# Patient Record
Sex: Female | Born: 1960 | Race: Black or African American | Hispanic: No | Smoking: Never smoker
Health system: Southern US, Community
[De-identification: ages and names within clinical notes are randomized; demographics above are authoritative.]

## PROBLEM LIST (undated history)

## (undated) DIAGNOSIS — K219 Gastro-esophageal reflux disease without esophagitis: Secondary | ICD-10-CM

## (undated) DIAGNOSIS — I10 Essential (primary) hypertension: Secondary | ICD-10-CM

## (undated) DIAGNOSIS — G709 Myoneural disorder, unspecified: Secondary | ICD-10-CM

## (undated) DIAGNOSIS — K279 Peptic ulcer, site unspecified, unspecified as acute or chronic, without hemorrhage or perforation: Secondary | ICD-10-CM

## (undated) DIAGNOSIS — F419 Anxiety disorder, unspecified: Secondary | ICD-10-CM

## (undated) DIAGNOSIS — E119 Type 2 diabetes mellitus without complications: Secondary | ICD-10-CM

## (undated) DIAGNOSIS — M1990 Unspecified osteoarthritis, unspecified site: Secondary | ICD-10-CM

## (undated) DIAGNOSIS — F32A Depression, unspecified: Secondary | ICD-10-CM

## (undated) DIAGNOSIS — J45909 Unspecified asthma, uncomplicated: Secondary | ICD-10-CM

## (undated) HISTORY — PX: BREAST CYST EXCISION: SHX579

## (undated) HISTORY — PX: ABDOMINAL HYSTERECTOMY: SHX81

---

## 2003-11-07 ENCOUNTER — Other Ambulatory Visit: Payer: Self-pay

## 2004-02-05 ENCOUNTER — Other Ambulatory Visit: Payer: Self-pay

## 2005-01-29 ENCOUNTER — Emergency Department: Payer: Self-pay | Admitting: Emergency Medicine

## 2005-02-19 ENCOUNTER — Emergency Department: Payer: Self-pay | Admitting: Emergency Medicine

## 2005-05-17 ENCOUNTER — Emergency Department: Payer: Self-pay | Admitting: Emergency Medicine

## 2005-05-17 ENCOUNTER — Other Ambulatory Visit: Payer: Self-pay

## 2007-03-09 ENCOUNTER — Emergency Department: Payer: Self-pay | Admitting: Emergency Medicine

## 2007-03-09 ENCOUNTER — Other Ambulatory Visit: Payer: Self-pay

## 2008-02-29 ENCOUNTER — Emergency Department: Payer: Self-pay | Admitting: Emergency Medicine

## 2008-04-22 ENCOUNTER — Emergency Department: Payer: Self-pay | Admitting: Emergency Medicine

## 2008-06-12 ENCOUNTER — Ambulatory Visit: Payer: Self-pay | Admitting: Internal Medicine

## 2008-09-18 ENCOUNTER — Emergency Department: Payer: Self-pay | Admitting: Emergency Medicine

## 2008-11-25 ENCOUNTER — Emergency Department: Payer: Self-pay | Admitting: Emergency Medicine

## 2009-05-26 ENCOUNTER — Emergency Department: Payer: Self-pay | Admitting: Emergency Medicine

## 2009-07-29 ENCOUNTER — Emergency Department: Payer: Self-pay

## 2009-08-11 ENCOUNTER — Emergency Department: Payer: Self-pay | Admitting: Internal Medicine

## 2010-01-20 ENCOUNTER — Emergency Department: Payer: Self-pay | Admitting: Emergency Medicine

## 2010-05-05 ENCOUNTER — Ambulatory Visit: Payer: Self-pay | Admitting: Internal Medicine

## 2011-09-04 ENCOUNTER — Emergency Department: Payer: Self-pay | Admitting: Unknown Physician Specialty

## 2012-02-22 ENCOUNTER — Emergency Department (HOSPITAL_COMMUNITY): Payer: Medicare Other

## 2012-02-22 ENCOUNTER — Encounter (HOSPITAL_COMMUNITY): Payer: Self-pay

## 2012-02-22 ENCOUNTER — Emergency Department (HOSPITAL_COMMUNITY)
Admission: EM | Admit: 2012-02-22 | Discharge: 2012-02-23 | Disposition: A | Discharge status: Home / Self Care | Payer: Medicare Other | Attending: Emergency Medicine | Admitting: Emergency Medicine

## 2012-02-22 DIAGNOSIS — R55 Syncope and collapse: Secondary | ICD-10-CM

## 2012-02-22 DIAGNOSIS — Z79899 Other long term (current) drug therapy: Secondary | ICD-10-CM | POA: Insufficient documentation

## 2012-02-22 DIAGNOSIS — F411 Generalized anxiety disorder: Secondary | ICD-10-CM | POA: Insufficient documentation

## 2012-02-22 DIAGNOSIS — I10 Essential (primary) hypertension: Secondary | ICD-10-CM | POA: Insufficient documentation

## 2012-02-22 DIAGNOSIS — R079 Chest pain, unspecified: Secondary | ICD-10-CM | POA: Insufficient documentation

## 2012-02-22 HISTORY — DX: Anxiety disorder, unspecified: F41.9

## 2012-02-22 HISTORY — DX: Essential (primary) hypertension: I10

## 2012-02-22 HISTORY — DX: Peptic ulcer, site unspecified, unspecified as acute or chronic, without hemorrhage or perforation: K27.9

## 2012-02-22 LAB — DIFFERENTIAL
Eosinophils Relative: 2 % (ref 0–5)
Lymphocytes Relative: 24 % (ref 12–46)
Lymphs Abs: 1.8 10*3/uL (ref 0.7–4.0)

## 2012-02-22 LAB — CBC
Hemoglobin: 11.9 g/dL — ABNORMAL LOW (ref 12.0–15.0)
MCV: 81 fL (ref 78.0–100.0)
Platelets: 290 10*3/uL (ref 150–400)
RBC: 4.41 MIL/uL (ref 3.87–5.11)
WBC: 7.5 10*3/uL (ref 4.0–10.5)

## 2012-02-22 LAB — BASIC METABOLIC PANEL
CO2: 26 mEq/L (ref 19–32)
Calcium: 10.2 mg/dL (ref 8.4–10.5)
Glucose, Bld: 137 mg/dL — ABNORMAL HIGH (ref 70–99)
Potassium: 3.2 mEq/L — ABNORMAL LOW (ref 3.5–5.1)
Sodium: 138 mEq/L (ref 135–145)

## 2012-02-22 LAB — PROTIME-INR
INR: 1.01 (ref 0.00–1.49)
Prothrombin Time: 13.5 seconds (ref 11.6–15.2)

## 2012-02-22 LAB — CARDIAC PANEL(CRET KIN+CKTOT+MB+TROPI)
CK, MB: 2.3 ng/mL (ref 0.3–4.0)
Troponin I: <0.3 ng/mL (ref <0.30)

## 2012-02-22 MED ORDER — ASPIRIN 81 MG PO CHEW: 324.0000 mg | CHEWABLE_TABLET | Freq: Once | ORAL | Status: DC

## 2012-02-22 MED ORDER — POTASSIUM CHLORIDE CRYS ER 20 MEQ PO TBCR
40.0000 meq | EXTENDED_RELEASE_TABLET | Freq: Once | ORAL | Status: AC
  Administered 2012-02-23: 40 meq via ORAL
  Filled 2012-02-22: qty 2

## 2012-02-22 NOTE — ED Provider Notes (Addendum)
History  This chart was scribed for Felisa Bonier, MD by Bennett Scrape. This patient was seen in room APA18/AP[facility]18 and the patient's care was started at 10:29PM.  CSN: [id number]  Arrival date & time 02/22/12  2151   First MD Initiated Contact with Patient 02/22/12 2219      Chief Complaint  Patient presents with  . Chest Pain     The history is provided by the patient. No language interpreter was used.    Elizabeth Coffey is a 51 y.o. female brought in by ambulance, who presents to the Emergency Department complaining of a syncopal episode that occurred after she found out that her husband had passed away. Pt states that her husband passed away at hospice today around 4pm and that she was not told that he had passed until 9PM when she arrived at the hospice home to see him. She then continues on to to say that she was immediately thrust into family bickering over her husband's estate and felt like she couldn't breath, so she stepped out into the hallway and felt everything go black. She then remembers waking up on the floor with left sided chest pain that radiated into her left arm and feeling diaphoretic. She stated that her chest pain last until EMS gave her the second nitroglycerine pill. She denies nausea and vomiting as associated symptoms. She has a h/o HTN and anxiety, but she denies having a h/o MI. She denies smoking and alcohol use.  Past Medical History  Diagnosis Date  . Hypertension   . Anxiety   . Peptic ulcer     Past Surgical History  Procedure Date  . Abdominal hysterectomy     No family history on file.  History  Substance Use Topics  . Smoking status: Never Smoker   . Smokeless tobacco: Not on file  . Alcohol Use: No     Review of Systems  Constitutional: Negative for fever and chills.  HENT: Negative for congestion, sore throat and neck pain.   Eyes: Negative for pain.  Respiratory: Negative for cough and shortness of breath.   Cardiovascular: Positive  for chest pain.  Gastrointestinal: Negative for nausea, vomiting, abdominal pain and diarrhea.  Genitourinary: Negative for dysuria, frequency and hematuria.  Musculoskeletal: Negative for back pain.  Skin: Negative for rash.  Neurological: Positive for syncope. Negative for headaches.    Allergies  Chocolate  Home Medications   Current Outpatient Rx  Name Route Sig Dispense Refill  . AMLODIPINE BESY-BENAZEPRIL HCL 10-20 MG PO CAPS Oral Take 1 capsule by mouth daily.    . ASPIRIN-ACETAMINOPHEN-CAFFEINE 250-250-65 MG PO TABS Oral Take 1-2 tablets by mouth as needed. For headaches    . BUPROPION HCL 100 MG PO TABS Oral Take 100 mg by mouth 2 (two) times daily.    Marland Kitchen OMEPRAZOLE 40 MG PO CPDR Oral Take 40 mg by mouth daily.    . TRIAMTERENE-HCTZ 37.5-25 MG PO TABS Oral Take 1 tablet by mouth daily.      Triage Vitals: BP 152/92  Temp(Src) 98.7 F (37.1 C) (Oral)  Resp 16  Ht 5\' 4"  (1.626 m)  Wt 210 lb (95.255 kg)  BMI 36.05 kg/m2  SpO2 99%  Physical Exam  Nursing note and vitals reviewed. Constitutional: She is oriented to person, place, and time. She appears well-developed and well-nourished.  HENT:  Head: Normocephalic and atraumatic.  Eyes: Conjunctivae are normal. Pupils are equal, round, and reactive to light.  Neck: Normal range of motion. Neck  supple. No JVD present.  Cardiovascular: Normal rate, regular rhythm and normal heart sounds.  Exam reveals no gallop and no friction rub.   No murmur heard. Pulmonary/Chest: Effort normal and breath sounds normal. No respiratory distress. She has no wheezes. She has no rales.       Lungs are clear, no rhonchi, good air exchange  Abdominal: Soft. Bowel sounds are normal. There is no tenderness.  Musculoskeletal: Normal range of motion. She exhibits no edema (no lower extremity edema).  Neurological: She is alert and oriented to person, place, and time. No cranial nerve deficit.  Skin: Skin is warm and dry.    ED Course    Procedures (including critical care time)   Date: 02/22/2012  Rate: 89  Rhythm: normal sinus rhythm  QRS Axis: normal  Intervals: normal  ST/T Wave abnormalities: nonspecific T wave changes  Conduction Disutrbances:none  Narrative Interpretation: non-provacative EKG  Old EKG Reviewed: none available  DIAGNOSTIC STUDIES: Oxygen Saturation is 99% on room air, normal by my interpretation.    COORDINATION OF CARE: 10:35PM-Discussed treatment plan with pt and pt agreed to plan.    Labs Reviewed  CBC - Abnormal; Notable for the following:    Hemoglobin 11.9 (*)    HCT 35.7 (*)    All other components within normal limits  BASIC METABOLIC PANEL - Abnormal; Notable for the following:    Potassium 3.2 (*)    Glucose, Bld 137 (*)    Creatinine, Ser 1.15 (*)    GFR calc non Af Amer 54 (*)    GFR calc Af Amer 63 (*)    All other components within normal limits  CARDIAC PANEL(CRET KIN+CKTOT+MB+TROPI) - Abnormal; Notable for the following:    Total CK 197 (*)    All other components within normal limits  DIFFERENTIAL  PROTIME-INR  APTT  POCT I-STAT TROPONIN I   Dg Chest Port 1 View  02/22/2012  *RADIOLOGY REPORT*  Clinical Data: Chest pain  PORTABLE CHEST - 1 VIEW  Comparison: None.  Findings: Lungs clear.  Heart size and pulmonary vascularity normal.  No effusion.  Visualized bones unremarkable.  IMPRESSION: No acute disease  Original Report Authenticated By: Osa Craver, M.D.     No diagnosis found.    MDM  Vasovagal syncope, anemia, arrthymia, electrolyte abnormality, myocardial infarction, and acute coronary syndrome are all entertained in the differenital diagnosis.  At this time, the patient has had no recurrent chest pain symptoms, and her cardiac enzymes and EKG show no evidence of myocardial ischemia or arrhythmia. She has no apparent anemia or electrolyte abnormality. Based on the circumstances, the patient having just learned of her husband's death, and  severe emotional distress, I believe that this was a vasovagal syncope. The patient is otherwise not at high risk for coronary artery disease and does not have known history of coronary artery disease. I do not think she was experiencing ischemic chest pain. She has had no ectopy on the monitor and I do not suspect arrhythmia as the cause of syncope. I will repeat the cardiac enzyme at this time to assess for any change or elevation, and if negative I will discharge the patient home. I've given the patient strict return instructions regarding symptoms that should prompt her to return immediately to the emergency department for further evaluation, and otherwise refer her to followup with her primary care physician for further evaluation as needed. The patient states her understanding of and agreement with the plan of care.  1:22 AM I have reviewed the laboratory results with the patient and given her strict return instructions regarding any recurrent chest pain to come back to the emergency department immediately for further evaluation. She states her understanding and agreement with this, and informs me that she has an appointment later this morning with her primary care physician, Dr. Maryellen Pile in [place]. She had previously contacted her primary care physician after her syncopal episode and before coming to the emergency department.  Felisa Bonier, MD 02/23/12 [phone]

## 2012-02-22 NOTE — ED Notes (Signed)
Pt's husband died at Hospice today at 4:30 pm and when she got there tonight at 9 she found out he had passed away.  Pt then had syncopal episode in the hallway and when she awoke she had chest pain that went into her left arm.   Pt given 4 baby aspirin and 2 nitro by ems and with relief of pain

## 2012-02-23 NOTE — Discharge Instructions (Signed)
Chest Pain (Nonspecific) It is often hard to give a specific diagnosis for the cause of chest pain. There is always a chance that your pain could be related to something serious, such as a heart attack or a blood clot in the lungs. You need to follow up with your caregiver for further evaluation. CAUSES   Heartburn.   Pneumonia or bronchitis.   Anxiety and stress.   Inflammation around your heart (pericarditis) or lung (pleuritis or pleurisy).   A blood clot in the lung.   A collapsed lung (pneumothorax). It can develop suddenly on its own (spontaneous pneumothorax) or from injury (trauma) to the chest.  The chest wall is composed of bones, muscles, and cartilage. Any of these can be the source of the pain.  The bones can be bruised by injury.   The muscles or cartilage can be strained by coughing or overwork.   The cartilage can be affected by inflammation and become sore (costochondritis).  DIAGNOSIS  Lab tests or other studies, such as X-rays, an EKG, stress testing, or cardiac imaging, may be needed to find the cause of your pain.  TREATMENT   Treatment depends on what may be causing your chest pain. Treatment may include:   Acid blockers for heartburn.   Anti-inflammatory medicine.   Pain medicine for inflammatory conditions.   Antibiotics if an infection is present.   You may be advised to change lifestyle habits. This includes stopping smoking and avoiding caffeine and chocolate.   You may be advised to keep your head raised (elevated) when sleeping. This reduces the chance of acid going backward from your stomach into your esophagus.   Most of the time, nonspecific chest pain will improve within 2 to 3 days with rest and mild pain medicine.  HOME CARE INSTRUCTIONS   If antibiotics were prescribed, take the full amount even if you start to feel better.   For the next few days, avoid physical activities that bring on chest pain. Continue physical activities as  directed.   Do not smoke cigarettes or drink alcohol until your symptoms are gone.   Only take over-the-counter or prescription medicine for pain, discomfort, or fever as directed by your caregiver.   Follow your caregiver's suggestions for further testing if your chest pain does not go away.   Keep any follow-up appointments you made. If you do not go to an appointment, you could develop lasting (chronic) problems with pain. If there is any problem keeping an appointment, you must call to reschedule.  SEEK MEDICAL CARE IF:   You think you are having problems from the medicine you are taking. Read your medicine instructions carefully.   Your chest pain does not go away, even after treatment.   You develop a rash with blisters on your chest.  SEEK IMMEDIATE MEDICAL CARE IF:   You have increased chest pain or pain that spreads to your arm, neck, jaw, back, or belly (abdomen).   You develop shortness of breath, an increasing cough, or you are coughing up blood.   You have severe back or abdominal pain, feel sick to your stomach (nauseous) or throw up (vomit).   You develop severe weakness, fainting, or chills.   You have an oral temperature above 102 F (38.9 C), not controlled by medicine.  THIS IS AN EMERGENCY. Do not wait to see if the pain will go away. Get medical help at once. Call your local emergency services (911 in [place] Do not drive yourself to   the hospital. MAKE SURE YOU:   Understand these instructions.   Will watch your condition.   Will get help right away if you are not doing well or get worse.  Document Released: 09/23/2005 Document Revised: 08/26/2011 Document Reviewed: 07/19/2008 [facility] Patient Information 2012 [place], [place].Electrocardiography An electrocardiogram is also known as an EKG or ECG. An EKG records the electrical impulses of the heart and is an important test to assess heart health. It provides insight regarding your heart rhythm, heart size,  heart function and can can provide information as to whether you have had a heart attack. An EKG may be part of a routine physical exam and is done if you have experienced chest pain.  PROCEDURE   You will be asked to remove your clothes from the waist up, wear a hospital gown and lie down on your back for the test.   Electrodes or sticky patches are placed on your arms, legs and chest.   The electrodes are attached by wires to an EKG machine which records the electrical activity of your heart.   You will be asked to relax and lie still for a few seconds.   The EKG test is simple, safe and painless. It takes only a few minutes to perform the test. You cannot be shocked by the machine and no electricity goes through your body.  AFTER THE EKG  If the EKG was part of a routine physical exam, you may return to normal activities as told by your caregiver.   If the EKG was done to evaluate chest pain, you may need additional testing as determined by your caregiver for your safety.   Your doctor or a specially trained heart doctor (Cardiologist) will read the EKG results.   Not all test results are available during your visit. If your test results are not back during the visit, make an appointment with your caregiver to find out the results. Do not assume everything is normal if you have not heard from your caregiver or the medical facility. It is important for you to follow up on all of your test results.  Document Released: 12/11/2000 Document Revised: 08/27/2011 Document Reviewed: 10/03/2008 [facility] Patient Information 2012 [place], [place].Emotional Crisis Part of your problem today may be due to an emotional crisis. Emotional states can cause many different physical signs and symptoms. These may include:  Chest or stomach pain.   Fluttering heartbeat.   Passing out.   Breathing difficulty.   Headaches.   Trembling.   Hot or cold flashes.   Numbness.   Dizziness.   Unusual  muscle pain or fatigue.   Insomnia.  When you have other medical problems, they are often made worse by emotional upsets. Emotional crises can increase your stress and anxiety. Finding ways to reduce your stress level can make you feel better. You will become more capable of dealing with these emotional states. Regular physical exercise such as walking can be very beneficial. Counseling or medicine to treat anxiety or depression may also be needed. See your caregiver if you have further problems or questions about your condition. Document Released: 12/14/2005 Document Revised: 08/26/2011 Document Reviewed: 05/31/2007 [facility] Patient Information 2012 [place], [place].Syncope You have had a fainting (syncopal) spell. A fainting episode is a sudden, short-lived loss of consciousness. It results in complete recovery. It occurs because there has been a temporary shortage of oxygen and/or sugar (glucose) to the brain. CAUSES   Blood pressure pills and other medications that may lower blood pressure  below normal. Sudden changes in posture (sudden standing).   Over-medication. Take your medications as directed.   Standing too long. This can cause blood to pool in the legs.   Seizure disorders.   Low blood sugar (hypoglycemia) of diabetes. This more commonly causes coma.   Bearing down to go to the bathroom. This can cause your blood pressure to rise suddenly. Your body compensates by making the blood pressure too low when you stop bearing down.   Hardening of the arteries where the brain temporarily does not receive enough blood.   Irregular heart beat and circulatory problems.   Fear, emotional distress, injury, sight of blood, or illness.  Your caregiver will send you home if the syncope was from non-worrisome causes (benign). Depending on your age and health, you may stay to be monitored and observed. If you return home, have someone stay with you if your caregiver feels that is desirable. It  is very important to keep all follow-up referrals and appointments in order to properly manage this condition. This is a serious problem which can lead to serious illness and death if not carefully managed.  WARNING: Do not drive or operate machinery until your caregiver feels that it is safe for you to do so. SEEK IMMEDIATE MEDICAL CARE IF:   You have another fainting episode or faint while lying or sitting down. DO NOT DRIVE YOURSELF. Call 911 if no other help is available.   You have chest pain, are feeling sick to your stomach (nausea), vomiting or abdominal pain.   You have an irregular heartbeat or one that is very fast (pulse over 120 beats per minute).   You have a loss of feeling in some part of your body or lose movement in your arms or legs.   You have difficulty with speech, confusion, severe weakness, or visual problems.   You become sweaty and/or feel light headed.  Make sure you are rechecked as instructed. Document Released: 12/14/2005 Document Revised: 08/26/2011 Document Reviewed: 08/04/2007 [facility] Patient Information 2012 [place], [place].Syncope You have had a fainting (syncopal) spell. A fainting episode is a sudden, short-lived loss of consciousness. It results in complete recovery. It occurs because there has been a temporary shortage of oxygen and/or sugar (glucose) to the brain. CAUSES   Blood pressure pills and other medications that may lower blood pressure below normal. Sudden changes in posture (sudden standing).   Over-medication. Take your medications as directed.   Standing too long. This can cause blood to pool in the legs.   Seizure disorders.   Low blood sugar (hypoglycemia) of diabetes. This more commonly causes coma.   Bearing down to go to the bathroom. This can cause your blood pressure to rise suddenly. Your body compensates by making the blood pressure too low when you stop bearing down.   Hardening of the arteries where the brain temporarily  does not receive enough blood.   Irregular heart beat and circulatory problems.   Fear, emotional distress, injury, sight of blood, or illness.  Your caregiver will send you home if the syncope was from non-worrisome causes (benign). Depending on your age and health, you may stay to be monitored and observed. If you return home, have someone stay with you if your caregiver feels that is desirable. It is very important to keep all follow-up referrals and appointments in order to properly manage this condition. This is a serious problem which can lead to serious illness and death if not carefully managed.  WARNING:  Do not drive or operate machinery until your caregiver feels that it is safe for you to do so. SEEK IMMEDIATE MEDICAL CARE IF:   You have another fainting episode or faint while lying or sitting down. DO NOT DRIVE YOURSELF. Call 911 if no other help is available.   You have chest pain, are feeling sick to your stomach (nausea), vomiting or abdominal pain.   You have an irregular heartbeat or one that is very fast (pulse over 120 beats per minute).   You have a loss of feeling in some part of your body or lose movement in your arms or legs.   You have difficulty with speech, confusion, severe weakness, or visual problems.   You become sweaty and/or feel light headed.  Make sure you are rechecked as instructed. Document Released: 12/14/2005 Document Revised: 08/26/2011 Document Reviewed: 08/04/2007 [facility] Patient Information 2012 [place], [place].

## 2012-03-28 ENCOUNTER — Emergency Department: Payer: Self-pay | Admitting: *Deleted

## 2012-06-21 ENCOUNTER — Emergency Department: Payer: Self-pay | Admitting: Emergency Medicine

## 2012-06-21 LAB — URINALYSIS, COMPLETE
Bacteria: NONE SEEN
Glucose,UR: NEGATIVE mg/dL (ref 0–75)
Ketone: NEGATIVE
Leukocyte Esterase: NEGATIVE
Nitrite: NEGATIVE
Ph: 6 (ref 4.5–8.0)
Protein: 30
RBC,UR: 1 /HPF (ref 0–5)
Specific Gravity: 1.017 (ref 1.003–1.030)

## 2012-06-23 LAB — THROAT CULTURE

## 2012-07-17 ENCOUNTER — Emergency Department: Payer: Self-pay | Admitting: Emergency Medicine

## 2012-07-17 LAB — URINALYSIS, COMPLETE
Bacteria: NONE SEEN
Glucose,UR: NEGATIVE mg/dL (ref 0–75)
Ketone: NEGATIVE
Specific Gravity: 1.011 (ref 1.003–1.030)
WBC UR: 2 /HPF (ref 0–5)

## 2012-11-17 ENCOUNTER — Ambulatory Visit: Payer: Self-pay | Admitting: Internal Medicine

## 2012-12-19 ENCOUNTER — Emergency Department: Payer: Self-pay | Admitting: Emergency Medicine

## 2013-06-15 ENCOUNTER — Ambulatory Visit: Payer: Self-pay | Admitting: Internal Medicine

## 2014-06-16 ENCOUNTER — Emergency Department: Payer: Self-pay | Admitting: Emergency Medicine

## 2014-06-19 LAB — BETA STREP CULTURE(ARMC)

## 2015-01-09 ENCOUNTER — Emergency Department: Payer: Self-pay | Admitting: Student

## 2015-01-09 LAB — COMPREHENSIVE METABOLIC PANEL
ALBUMIN: 3.8 g/dL (ref 3.4–5.0)
ALK PHOS: 106 U/L
ANION GAP: 5 — AB (ref 7–16)
AST: 18 U/L (ref 15–37)
BUN: 19 mg/dL — AB (ref 7–18)
Bilirubin,Total: 0.3 mg/dL (ref 0.2–1.0)
CALCIUM: 9.4 mg/dL (ref 8.5–10.1)
Chloride: 105 mmol/L (ref 98–107)
Co2: 31 mmol/L (ref 21–32)
Creatinine: 0.95 mg/dL (ref 0.60–1.30)
EGFR (African American): >60
EGFR (Non-African Amer.): >60
Glucose: 104 mg/dL — ABNORMAL HIGH (ref 65–99)
Osmolality: 284 (ref 275–301)
POTASSIUM: 3.5 mmol/L (ref 3.5–5.1)
SGPT (ALT): 22 U/L
SODIUM: 141 mmol/L (ref 136–145)
TOTAL PROTEIN: 8.2 g/dL (ref 6.4–8.2)

## 2015-01-09 LAB — URINALYSIS, COMPLETE
BACTERIA: NONE SEEN
Bilirubin,UR: NEGATIVE
Blood: NEGATIVE
GLUCOSE, UR: NEGATIVE mg/dL (ref 0–75)
Ketone: NEGATIVE
LEUKOCYTE ESTERASE: NEGATIVE
NITRITE: NEGATIVE
PROTEIN: NEGATIVE
Ph: 7 (ref 4.5–8.0)
RBC,UR: 1 /HPF (ref 0–5)
SPECIFIC GRAVITY: 1.019 (ref 1.003–1.030)

## 2015-01-09 LAB — CBC
HCT: 39.8 % (ref 35.0–47.0)
HGB: 12.9 g/dL (ref 12.0–16.0)
MCH: 26.7 pg (ref 26.0–34.0)
MCHC: 32.3 g/dL (ref 32.0–36.0)
MCV: 83 fL (ref 80–100)
PLATELETS: 238 10*3/uL (ref 150–440)
RBC: 4.82 10*6/uL (ref 3.80–5.20)
RDW: 14.9 % — AB (ref 11.5–14.5)
WBC: 6.1 10*3/uL (ref 3.6–11.0)

## 2015-01-09 LAB — LIPASE, BLOOD: Lipase: 160 U/L (ref 73–393)

## 2015-01-09 LAB — WET PREP, GENITAL

## 2015-01-09 LAB — GC/CHLAMYDIA PROBE AMP

## 2015-02-04 ENCOUNTER — Emergency Department: Payer: Self-pay | Admitting: Emergency Medicine

## 2015-06-30 ENCOUNTER — Encounter: Payer: Self-pay | Admitting: Emergency Medicine

## 2015-06-30 ENCOUNTER — Emergency Department
Admission: EM | Admit: 2015-06-30 | Discharge: 2015-06-30 | Disposition: A | Discharge status: Home / Self Care | Payer: Medicare Other | Attending: Emergency Medicine | Admitting: Emergency Medicine

## 2015-06-30 DIAGNOSIS — Y998 Other external cause status: Secondary | ICD-10-CM | POA: Diagnosis not present

## 2015-06-30 DIAGNOSIS — Z79899 Other long term (current) drug therapy: Secondary | ICD-10-CM | POA: Insufficient documentation

## 2015-06-30 DIAGNOSIS — W57XXXA Bitten or stung by nonvenomous insect and other nonvenomous arthropods, initial encounter: Secondary | ICD-10-CM | POA: Diagnosis not present

## 2015-06-30 DIAGNOSIS — R21 Rash and other nonspecific skin eruption: Secondary | ICD-10-CM | POA: Diagnosis present

## 2015-06-30 DIAGNOSIS — I10 Essential (primary) hypertension: Secondary | ICD-10-CM | POA: Insufficient documentation

## 2015-06-30 DIAGNOSIS — Y92091 Bathroom in other non-institutional residence as the place of occurrence of the external cause: Secondary | ICD-10-CM | POA: Insufficient documentation

## 2015-06-30 DIAGNOSIS — T148 Other injury of unspecified body region: Secondary | ICD-10-CM | POA: Diagnosis not present

## 2015-06-30 DIAGNOSIS — Y9389 Activity, other specified: Secondary | ICD-10-CM | POA: Insufficient documentation

## 2015-06-30 HISTORY — DX: Unspecified osteoarthritis, unspecified site: M19.90

## 2015-06-30 LAB — CBC WITH DIFFERENTIAL/PLATELET
BASOS ABS: 0.1 10*3/uL (ref 0–0.1)
BASOS PCT: 1 %
EOS PCT: 6 %
Eosinophils Absolute: 0.4 10*3/uL (ref 0–0.7)
HCT: 38.2 % (ref 35.0–47.0)
HEMOGLOBIN: 12.5 g/dL (ref 12.0–16.0)
LYMPHS ABS: 1.9 10*3/uL (ref 1.0–3.6)
LYMPHS PCT: 26 %
MCH: 26.7 pg (ref 26.0–34.0)
MCHC: 32.7 g/dL (ref 32.0–36.0)
MCV: 81.9 fL (ref 80.0–100.0)
MONOS PCT: 6 %
Monocytes Absolute: 0.4 10*3/uL (ref 0.2–0.9)
NEUTROS ABS: 4.2 10*3/uL (ref 1.4–6.5)
NEUTROS PCT: 61 %
PLATELETS: 248 10*3/uL (ref 150–440)
RBC: 4.67 MIL/uL (ref 3.80–5.20)
RDW: 15.3 % — ABNORMAL HIGH (ref 11.5–14.5)
WBC: 7 10*3/uL (ref 3.6–11.0)

## 2015-06-30 LAB — URINALYSIS COMPLETE WITH MICROSCOPIC (ARMC ONLY)
BACTERIA UA: NONE SEEN
Bilirubin Urine: NEGATIVE
Glucose, UA: NEGATIVE mg/dL
Hgb urine dipstick: NEGATIVE
Ketones, ur: NEGATIVE mg/dL
LEUKOCYTES UA: NEGATIVE
NITRITE: NEGATIVE
Protein, ur: NEGATIVE mg/dL
Specific Gravity, Urine: 1.015 (ref 1.005–1.030)
pH: 5 (ref 5.0–8.0)

## 2015-06-30 LAB — COMPREHENSIVE METABOLIC PANEL
ALK PHOS: 60 U/L (ref 38–126)
ALT: 19 U/L (ref 14–54)
AST: 22 U/L (ref 15–41)
Albumin: 3.9 g/dL (ref 3.5–5.0)
Anion gap: 9 (ref 5–15)
BUN: 31 mg/dL — ABNORMAL HIGH (ref 6–20)
CHLORIDE: 107 mmol/L (ref 101–111)
CO2: 25 mmol/L (ref 22–32)
Calcium: 9.2 mg/dL (ref 8.9–10.3)
Creatinine, Ser: 1.47 mg/dL — ABNORMAL HIGH (ref 0.44–1.00)
GFR calc Af Amer: 45 mL/min — ABNORMAL LOW (ref >60)
GFR, EST NON AFRICAN AMERICAN: 39 mL/min — AB (ref >60)
Glucose, Bld: 123 mg/dL — ABNORMAL HIGH (ref 65–99)
Potassium: 3.2 mmol/L — ABNORMAL LOW (ref 3.5–5.1)
SODIUM: 141 mmol/L (ref 135–145)
TOTAL PROTEIN: 6.9 g/dL (ref 6.5–8.1)
Total Bilirubin: 0.4 mg/dL (ref 0.3–1.2)

## 2015-06-30 LAB — LIPASE, BLOOD: LIPASE: 34 U/L (ref 22–51)

## 2015-06-30 MED ORDER — PERMETHRIN 5 % EX CREA: 1.0000 "application " | TOPICAL_CREAM | Freq: Once | CUTANEOUS | Status: DC

## 2015-06-30 MED ORDER — HYDROXYZINE HCL 10 MG PO TABS: 10.0000 mg | ORAL_TABLET | Freq: Three times a day (TID) | ORAL | Status: DC | PRN

## 2015-06-30 NOTE — ED Notes (Signed)
Patient c/o small red raised bumps to legs and arms. The bumps itch. Also c/o lower abdominal pain. Reports urinary frequency. Denies vaginal discharge but states she wants to be tested for STD's. Hx IBS; reports no changes in her stools.

## 2015-06-30 NOTE — ED Provider Notes (Signed)
[facility] Emergency Department Provider Note  ____________________________________________  Time seen: Approximately 3:36 PM  I have reviewed the triage vital signs and the nursing notes.   HISTORY  Chief Complaint Abdominal Pain and Rash    HPI Elizabeth Coffey is a 54 y.o. female patient reports she has been having some itching for about 2 months small red spots mostly in the legs although there are some scattered on the trunk and arms. This itching and the red spots have increased markedly since she spent holiday weekend at her fianc's house. She reports her fianc has imaging as well but he is dark skinned and doesn't show any spots when she does. She also noticed some sort of insect on the toilet seat in his house. She has a number of dark red and points with about 4 mm of surrounding erythema achieve easier on her legs above the area where she is recovering up into the thighs 01 or 2 on her back in trunk. There are also a few on her arms mostly on the legs. These itch and do not hurt. Patient reported she had some lower abdominal pain but this is now gone. She has no urinary frequency urgency or any other urinary complaints. She is not having any fever nausea vomiting diarrhea. She is sexually active but only has one partner.   Past Medical History  Diagnosis Date  . Hypertension   . Anxiety   . Peptic ulcer   . Arthritis     There are no active problems to display for this patient.   Past Surgical History  Procedure Laterality Date  . Abdominal hysterectomy      Current Outpatient Rx  Name  Route  Sig  Dispense  Refill  . amLODipine-benazepril (LOTREL) 10-20 MG per capsule   Oral   Take 1 capsule by mouth daily.         Marland Kitchen. aspirin-acetaminophen-caffeine (EXCEDRIN EXTRA STRENGTH) 250-250-65 MG per tablet   Oral   Take 1-2 tablets by mouth as needed. For headaches         . buPROPion (WELLBUTRIN) 100 MG tablet   Oral   Take 100 mg by mouth 2  (two) times daily.         . hydrOXYzine (ATARAX/VISTARIL) 10 MG tablet   Oral   Take 1 tablet (10 mg total) by mouth 3 (three) times daily as needed.   30 tablet   0   . omeprazole (PRILOSEC) 40 MG capsule   Oral   Take 40 mg by mouth daily.         . permethrin (ELIMITE) 5 % cream   Topical   Apply 1 application topically once.   60 g   1   . triamterene-hydrochlorothiazide (MAXZIDE-25) 37.5-25 MG per tablet   Oral   Take 1 tablet by mouth daily.           Allergies Chocolate  No family history on file.  Social History History  Substance Use Topics  . Smoking status: Never Smoker   . Smokeless tobacco: Not on file  . Alcohol Use: No    Review of Systems Constitutional: No fever/chills Eyes: No visual changes. ENT: No sore throat. Cardiovascular: Denies chest pain. Respiratory: Denies shortness of breath. Gastrointestinal: No abdominal pain.  No nausea, no vomiting.  No diarrhea.  No constipation. Genitourinary: Negative for dysuria. Denies any vaginal discharge Musculoskeletal: Negative for back pain. Skin: The history of present illness Neurological: Negative for headaches, focal  weakness or numbness.  10-point ROS otherwise negative.  ____________________________________________   PHYSICAL EXAM:  VITAL SIGNS: ED Triage Vitals  Enc Vitals Group     BP 06/30/15 1348 115/63 mmHg     Pulse Rate 06/30/15 1348 72     Resp 06/30/15 1348 20     Temp 06/30/15 1348 98.4 F (36.9 C)     Temp Source 06/30/15 1348 Oral     SpO2 06/30/15 1348 98 %     Weight 06/30/15 1348 197 lb (89.359 kg)     Height 06/30/15 1348  (1.626 m)     Head Cir --      Peak Flow --      Pain Score --      Pain Loc --      Pain Edu? --      Excl. in GC? --     Constitutional: Alert and oriented. Well appearing and in no acute distress. Eyes: Conjunctivae are normal. PERRL. EOMI. Head: Atraumatic. Nose: No congestion/rhinnorhea. Mouth/Throat: Mucous membranes  are moist.  Oropharynx non-erythematous. Neck: No stridor.  Cardiovascular: Normal rate, regular rhythm. Grossly normal heart sounds.  Good peripheral circulation. Respiratory: Normal respiratory effort.  No retractions. Lungs CTAB. Gastrointestinal: Soft and nontender. No distention. No abdominal bruits. No CVA tenderness. Musculoskeletal: No lower extremity tenderness nor edema.  No joint effusions. Neurologic:  Normal speech and language. No gross focal neurologic deficits are appreciated. Speech is normal. No gait instability. Skin:  Skin is warm, dry and intact. No rash noted. Psychiatric: Mood and affect are normal. Speech and behavior are normal.  ____________________________________________   LABS (all labs ordered are listed, but only abnormal results are displayed)  Labs Reviewed  COMPREHENSIVE METABOLIC PANEL - Abnormal; Notable for the following:    Potassium 3.2 (*)    Glucose, Bld 123 (*)    BUN 31 (*)    Creatinine, Ser 1.47 (*)    GFR calc non Af Amer 39 (*)    GFR calc Af Amer 45 (*)    All other components within normal limits  CBC WITH DIFFERENTIAL/PLATELET - Abnormal; Notable for the following:    RDW 15.3 (*)    All other components within normal limits  URINALYSIS COMPLETEWITH MICROSCOPIC ([facility] ONLY) - Abnormal; Notable for the following:    Color, Urine STRAW (*)    APPearance CLEAR (*)    Squamous Epithelial / LPF 0-5 (*)    All other components within normal limits  LIPASE, BLOOD   ____________________________________________  EKG   ____________________________________________  RADIOLOGY   ____________________________________________   PROCEDURES    ____________________________________________   INITIAL IMPRESSION / ASSESSMENT AND PLAN / ED COURSE  Pertinent labs & imaging results that were available during my care of the patient were reviewed by me and considered in my medical decision making (see chart for  details).   ____________________________________________   FINAL CLINICAL IMPRESSION(S) / ED DIAGNOSES  Final diagnoses:  Insect bites      Arnaldo Natal, MD 06/30/15 1544

## 2015-09-07 ENCOUNTER — Encounter: Payer: Self-pay | Admitting: Emergency Medicine

## 2015-09-07 ENCOUNTER — Emergency Department: Payer: Medicare Other

## 2015-09-07 ENCOUNTER — Emergency Department
Admission: EM | Admit: 2015-09-07 | Discharge: 2015-09-07 | Disposition: A | Discharge status: Home / Self Care | Payer: Medicare Other | Attending: Emergency Medicine | Admitting: Emergency Medicine

## 2015-09-07 DIAGNOSIS — Z791 Long term (current) use of non-steroidal anti-inflammatories (NSAID): Secondary | ICD-10-CM | POA: Insufficient documentation

## 2015-09-07 DIAGNOSIS — J9801 Acute bronchospasm: Secondary | ICD-10-CM | POA: Insufficient documentation

## 2015-09-07 DIAGNOSIS — J069 Acute upper respiratory infection, unspecified: Secondary | ICD-10-CM

## 2015-09-07 DIAGNOSIS — Z79899 Other long term (current) drug therapy: Secondary | ICD-10-CM | POA: Diagnosis not present

## 2015-09-07 DIAGNOSIS — I10 Essential (primary) hypertension: Secondary | ICD-10-CM | POA: Insufficient documentation

## 2015-09-07 DIAGNOSIS — R05 Cough: Secondary | ICD-10-CM | POA: Diagnosis present

## 2015-09-07 LAB — BASIC METABOLIC PANEL
ANION GAP: 9 (ref 5–15)
BUN: 25 mg/dL — ABNORMAL HIGH (ref 6–20)
CALCIUM: 9.6 mg/dL (ref 8.9–10.3)
CO2: 28 mmol/L (ref 22–32)
Chloride: 106 mmol/L (ref 101–111)
Creatinine, Ser: 1.07 mg/dL — ABNORMAL HIGH (ref 0.44–1.00)
GFR, EST NON AFRICAN AMERICAN: 57 mL/min — AB (ref >60)
Glucose, Bld: 102 mg/dL — ABNORMAL HIGH (ref 65–99)
Potassium: 3.6 mmol/L (ref 3.5–5.1)
Sodium: 143 mmol/L (ref 135–145)

## 2015-09-07 LAB — CBC WITH DIFFERENTIAL/PLATELET
BASOS ABS: 0.1 10*3/uL (ref 0–0.1)
BASOS PCT: 1 %
Eosinophils Absolute: 0.7 10*3/uL (ref 0–0.7)
Eosinophils Relative: 11 %
HEMATOCRIT: 37.4 % (ref 35.0–47.0)
HEMOGLOBIN: 12.3 g/dL (ref 12.0–16.0)
LYMPHS PCT: 28 %
Lymphs Abs: 1.7 10*3/uL (ref 1.0–3.6)
MCH: 26 pg (ref 26.0–34.0)
MCHC: 32.8 g/dL (ref 32.0–36.0)
MCV: 79.4 fL — AB (ref 80.0–100.0)
Monocytes Absolute: 0.5 10*3/uL (ref 0.2–0.9)
Monocytes Relative: 8 %
NEUTROS PCT: 52 %
Neutro Abs: 3.2 10*3/uL (ref 1.4–6.5)
Platelets: 245 10*3/uL (ref 150–440)
RBC: 4.71 MIL/uL (ref 3.80–5.20)
RDW: 14 % (ref 11.5–14.5)
WBC: 6.2 10*3/uL (ref 3.6–11.0)

## 2015-09-07 MED ORDER — BENZONATATE 100 MG PO CAPS: 100.0000 mg | ORAL_CAPSULE | Freq: Three times a day (TID) | ORAL | Status: AC | PRN

## 2015-09-07 MED ORDER — ALBUTEROL SULFATE HFA 108 (90 BASE) MCG/ACT IN AERS: 2.0000 | INHALATION_SPRAY | Freq: Four times a day (QID) | RESPIRATORY_TRACT | Status: DC | PRN

## 2015-09-07 MED ORDER — IPRATROPIUM-ALBUTEROL 0.5-2.5 (3) MG/3ML IN SOLN
3.0000 mL | Freq: Once | RESPIRATORY_TRACT | Status: AC
  Administered 2015-09-07: 3 mL via RESPIRATORY_TRACT

## 2015-09-07 NOTE — Discharge Instructions (Signed)
You were evaluated for upper respiratory symptoms including nasal congestion, sore throat, and cough, and your exam and evaluation are reassuring. I suspect a virus is causing your symptoms as well as wheezing also called bronco spasm in the lungs. You're being discharged with a prescription for cough medicine called Tessalon and an albuterol inhaler.  Return to the emergency room for any new or worsening condition including chest pain, trouble breathing, fever, weakness, numbness, confusion or altered mental status, or worsening rash.   Bronchospasm A bronchospasm is a spasm or tightening of the airways going into the lungs. During a bronchospasm breathing becomes more difficult because the airways get smaller. When this happens there can be coughing, a whistling sound when breathing (wheezing), and difficulty breathing. Bronchospasm is often associated with asthma, but not all patients who experience a bronchospasm have asthma. CAUSES  A bronchospasm is caused by inflammation or irritation of the airways. The inflammation or irritation may be triggered by:   Allergies (such as to animals, pollen, food, or mold). Allergens that cause bronchospasm may cause wheezing immediately after exposure or many hours later.   Infection. Viral infections are believed to be the most common cause of bronchospasm.   Exercise.   Irritants (such as pollution, cigarette smoke, strong odors, aerosol sprays, and paint fumes).   Weather changes. Winds increase molds and pollens in the air. Rain refreshes the air by washing irritants out. Cold air may cause inflammation.   Stress and emotional upset.  SIGNS AND SYMPTOMS   Wheezing.   Excessive nighttime coughing.   Frequent or severe coughing with a simple cold.   Chest tightness.   Shortness of breath.  DIAGNOSIS  Bronchospasm is usually diagnosed through a history and physical exam. Tests, such as chest X-rays, are sometimes done to look for  other conditions. TREATMENT   Inhaled medicines can be given to open up your airways and help you breathe. The medicines can be given using either an inhaler or a nebulizer machine.  Corticosteroid medicines may be given for severe bronchospasm, usually when it is associated with asthma. HOME CARE INSTRUCTIONS   Always have a plan prepared for seeking medical care. Know when to call your health care provider and local emergency services (911 in the [place] Know where you can access local emergency care.  Only take medicines as directed by your health care provider.  If you were prescribed an inhaler or nebulizer machine, ask your health care provider to explain how to use it correctly. Always use a spacer with your inhaler if you were given one.  It is necessary to remain calm during an attack. Try to relax and breathe more slowly.  Control your home environment in the following ways:   Change your heating and air conditioning filter at least once a month.   Limit your use of fireplaces and wood stoves.  Do not smoke and do not allow smoking in your home.   Avoid exposure to perfumes and fragrances.   Get rid of pests (such as roaches and mice) and their droppings.   Throw away plants if you see mold on them.   Keep your house clean and dust free.   Replace carpet with wood, tile, or vinyl flooring. Carpet can trap dander and dust.   Use allergy-proof pillows, mattress covers, and box spring covers.   Wash bed sheets and blankets every week in hot water and dry them in a dryer.   Use blankets that are made of polyester or  cotton.   Wash hands frequently. SEEK MEDICAL CARE IF:   You have muscle aches.   You have chest pain.   The sputum changes from clear or white to yellow, green, gray, or bloody.   The sputum you cough up gets thicker.   There are problems that may be related to the medicine you are given, such as a rash, itching, swelling, or  trouble breathing.  SEEK IMMEDIATE MEDICAL CARE IF:   You have worsening wheezing and coughing even after taking your prescribed medicines.   You have increased difficulty breathing.   You develop severe chest pain. MAKE SURE YOU:   Understand these instructions.  Will watch your condition.  Will get help right away if you are not doing well or get worse. Document Released: 12/17/2003 Document Revised: 12/19/2013 Document Reviewed: 06/05/2013 [facility] Patient Information 2015 [place], [place]. This information is not intended to replace advice given to you by your health care provider. Make sure you discuss any questions you have with your health care provider.  Upper Respiratory Infection, Adult An upper respiratory infection (URI) is also known as the common cold. It is often caused by a type of germ (virus). Colds are easily spread (contagious). You can pass it to others by kissing, coughing, sneezing, or drinking out of the same glass. Usually, you get better in 1 or 2 weeks.  HOME CARE   Only take medicine as told by your doctor.  Use a warm mist humidifier or breathe in steam from a hot shower.  Drink enough water and fluids to keep your pee (urine) clear or pale yellow.  Get plenty of rest.  Return to work when your temperature is back to normal or as told by your doctor. You may use a face mask and wash your hands to stop your cold from spreading. GET HELP RIGHT AWAY IF:   After the first few days, you feel you are getting worse.  You have questions about your medicine.  You have chills, shortness of breath, or brown or red spit (mucus).  You have yellow or brown snot (nasal discharge) or pain in the face, especially when you bend forward.  You have a fever, puffy (swollen) neck, pain when you swallow, or white spots in the back of your throat.  You have a bad headache, ear pain, sinus pain, or chest pain.  You have a high-pitched whistling sound when you  breathe in and out (wheezing).  You have a lasting cough or cough up blood.  You have sore muscles or a stiff neck. MAKE SURE YOU:   Understand these instructions.  Will watch your condition.  Will get help right away if you are not doing well or get worse. Document Released: 06/01/2008 Document Revised: 03/07/2012 Document Reviewed: 03/21/2014 [facility] Patient Information 2015 [place], [place]. This information is not intended to replace advice given to you by your health care provider. Make sure you discuss any questions you have with your health care provider.

## 2015-09-07 NOTE — ED Notes (Signed)
Pt with mask on from triage.

## 2015-09-07 NOTE — ED Provider Notes (Signed)
[facility] Emergency Department Provider Note   ____________________________________________  Time seen: 1:10 PM I have reviewed the triage vital signs and the triage nursing note.  HISTORY  Chief Complaint Cough   Historian Patient  HPI Elizabeth Coffey is a 54 y.o. female who is presenting to the emergency department as her primary care physician Dr. Maryellen Pile is on vacation unavailable to see her. She is presenting due to approximately one week of upper respiratory symptoms including a dry cough, sneezing, sore throat, body aches and also an itchy and slightly painful red dot rash on her legs. She's been subjectively febrile with chills without any documented fever. No recent travel. No abdominal pain or diarrhea. No chest pain.    Past Medical History  Diagnosis Date  . Hypertension   . Anxiety   . Peptic ulcer   . Arthritis     There are no active problems to display for this patient.   Past Surgical History  Procedure Laterality Date  . Abdominal hysterectomy    . Breast cyst excision      Current Outpatient Rx  Name  Route  Sig  Dispense  Refill  . amLODipine-benazepril (LOTREL) 5-40 MG per capsule   Oral   Take 1 capsule by mouth daily.         Marland Kitchen etodolac (LODINE) 500 MG tablet   Oral   Take 500 mg by mouth 2 (two) times daily.         Marland Kitchen FLUoxetine (PROZAC) 20 MG capsule   Oral   Take 20 mg by mouth daily.         . hydrOXYzine (ATARAX/VISTARIL) 25 MG tablet   Oral   Take 25 mg by mouth 3 (three) times daily as needed.         . meloxicam (MOBIC) 15 MG tablet   Oral   Take 15 mg by mouth daily.         Marland Kitchen omeprazole (PRILOSEC) 40 MG capsule   Oral   Take 40 mg by mouth daily.         Marland Kitchen triamterene-hydrochlorothiazide (MAXZIDE-25) 37.5-25 MG per tablet   Oral   Take 1 tablet by mouth daily.         Marland Kitchen albuterol (PROVENTIL HFA;VENTOLIN HFA) 108 (90 BASE) MCG/ACT inhaler   Inhalation   Inhale 2 puffs into the lungs  every 6 (six) hours as needed for wheezing or shortness of breath.   1 Inhaler   0   . amLODipine-benazepril (LOTREL) 10-20 MG per capsule   Oral   Take 1 capsule by mouth daily.         . benzonatate (TESSALON PERLES) 100 MG capsule   Oral   Take 1 capsule (100 mg total) by mouth 3 (three) times daily as needed for cough.   15 capsule   0   . hydrOXYzine (ATARAX/VISTARIL) 10 MG tablet   Oral   Take 1 tablet (10 mg total) by mouth 3 (three) times daily as needed.   30 tablet   0   . permethrin (ELIMITE) 5 % cream   Topical   Apply 1 application topically once.   60 g   1     Allergies Chocolate  No family history on file.  Social History Social History  Substance Use Topics  . Smoking status: Never Smoker   . Smokeless tobacco: None  . Alcohol Use: No    Review of Systems  Constitutional: Positive subjective fever. Eyes:  Negative for visual changes. ENT: Negative for sore throat. Cardiovascular: Negative for chest pain. Respiratory: Positive for cough/shortness of breath. Gastrointestinal: Negative for abdominal pain, vomiting and diarrhea. Genitourinary: Negative for dysuria. Musculoskeletal: Negative for back pain. Skin: Negative for rash. Neurological: Positive for intermittent global headache. 10 point Review of Systems otherwise negative ____________________________________________   PHYSICAL EXAM:  VITAL SIGNS: ED Triage Vitals  Enc Vitals Group     BP 09/07/15 1233 143/88 mmHg     Pulse Rate 09/07/15 1233 71     Resp --      Temp 09/07/15 1233 98.1 F (36.7 C)     Temp Source 09/07/15 1233 Oral     SpO2 09/07/15 1233 98 %     Weight 09/07/15 1233 198 lb (89.812 kg)     Height 09/07/15 1233  (1.626 m)     Head Cir --      Peak Flow --      Pain Score 09/07/15 1233 8     Pain Loc --      Pain Edu? --      Excl. in GC? --      Constitutional: Alert and oriented. Well appearing and in no distress. Eyes: Conjunctivae are  normal. PERRL. Normal extraocular movements. ENT   Head: Normocephalic and atraumatic.   Nose: No congestion/rhinnorhea.   Mouth/Throat: Mucous membranes are moist. Oropharynx mildly erythematous without enlarged tonsils or exudates.   Neck: No stridor. Mild anterior chain lymphadenopathy bilaterally. Cardiovascular/Chest: Normal rate, regular rhythm.  No murmurs, rubs, or gallops. Respiratory: Normal respiratory effort without tachypnea nor retractions. Breath sounds are clear and equal bilaterally, except upon and exhalation there are moderate wheezing and induction of cough. Gastrointestinal: Soft. No distention, no guarding, no rebound. Nontender   Genitourinary/rectal:Deferred Musculoskeletal: Nontender with normal range of motion in all extremities. No joint effusions.  No lower extremity tenderness.  No edema. Neurologic:  Normal speech and language. No gross or focal neurologic deficits are appreciated. Skin:  Skin is warm, dry and intact. 2 tiny red dots over both lower leg and upper legs which appear like insect bites. No purpura or petechiae. Psychiatric: Mood and affect are normal. Speech and behavior are normal. Patient exhibits appropriate insight and judgment.  ____________________________________________   EKG I, Governor Rooks, MD, the attending physician have personally viewed and interpreted all ECGs.   EKG performed ____________________________________________  LABS (pertinent positives/negatives)  Basic metabolic panel significant for BUN 25 and creatinine 1.07 otherwise electrolytes within normal limits CBC within normal limits Rapid strep negative  ____________________________________________  RADIOLOGY All Xrays were viewed by me. Imaging interpreted by Radiologist.  Chest x-ray two-view: No active cardiopulmonary disease __________________________________________  PROCEDURES  Procedure(s) performed: None  Critical Care performed:  None  ____________________________________________   ED COURSE / ASSESSMENT AND PLAN  CONSULTATIONS: None  Pertinent labs & imaging results that were available during my care of the patient were reviewed by me and considered in my medical decision making (see chart for details).  Patient is overall well appearing and clinically has an upper respiratory infection with bronchospasm. The lower extremity rash appears to me to likely be bites, rather than any dangerous or immunologic or infectious rash. Patient will be given DuoNeb for bronchospasm.  ----------------------------------------- 2:39 PM on 09/07/2015 -----------------------------------------  Her laboratory and imaging exam are reassuring. Bronchospasm symptoms are improved after DuoNeb. I will discharge the patient both with Tessalon and albuterol inhaler.   Patient / Family / Caregiver informed of clinical course, medical  decision-making process, and agree with plan.   I discussed return precautions, follow-up instructions, and discharged instructions with patient and/or family.  ___________________________________________   FINAL CLINICAL IMPRESSION(S) / ED DIAGNOSES   Final diagnoses:  Viral upper respiratory infection  Bronchospasm       Governor Rooks, MD 09/07/15 1440

## 2015-09-07 NOTE — ED Notes (Signed)
Pt states that she has had cough, sneezing, sore throat, body aches, rash to legs onset a week ago. extrememly diaphoretic at triage. Dizzy and nauseated at times. Chills at times.

## 2015-09-09 LAB — CULTURE, GROUP A STREP (THRC)

## 2015-09-10 LAB — POCT RAPID STREP A: STREPTOCOCCUS, GROUP A SCREEN (DIRECT): NEGATIVE

## 2015-09-10 LAB — GLUCOSE, CAPILLARY: GLUCOSE-CAPILLARY: 102 mg/dL — AB (ref 65–99)

## 2015-09-29 ENCOUNTER — Encounter: Payer: Self-pay | Admitting: Emergency Medicine

## 2015-09-29 ENCOUNTER — Emergency Department: Payer: Medicare Other

## 2015-09-29 ENCOUNTER — Emergency Department
Admission: EM | Admit: 2015-09-29 | Discharge: 2015-09-29 | Disposition: A | Discharge status: Home / Self Care | Payer: Medicare Other | Attending: Emergency Medicine | Admitting: Emergency Medicine

## 2015-09-29 DIAGNOSIS — I10 Essential (primary) hypertension: Secondary | ICD-10-CM | POA: Insufficient documentation

## 2015-09-29 DIAGNOSIS — Z79899 Other long term (current) drug therapy: Secondary | ICD-10-CM | POA: Diagnosis not present

## 2015-09-29 DIAGNOSIS — M791 Myalgia: Secondary | ICD-10-CM | POA: Diagnosis not present

## 2015-09-29 DIAGNOSIS — J069 Acute upper respiratory infection, unspecified: Secondary | ICD-10-CM | POA: Diagnosis not present

## 2015-09-29 DIAGNOSIS — R0602 Shortness of breath: Secondary | ICD-10-CM | POA: Diagnosis present

## 2015-09-29 DIAGNOSIS — Z791 Long term (current) use of non-steroidal anti-inflammatories (NSAID): Secondary | ICD-10-CM | POA: Diagnosis not present

## 2015-09-29 LAB — CBC WITH DIFFERENTIAL/PLATELET
BASOS PCT: 1 %
Basophils Absolute: 0.1 10*3/uL (ref 0–0.1)
EOS ABS: 0.7 10*3/uL (ref 0–0.7)
EOS PCT: 11 %
HCT: 37.5 % (ref 35.0–47.0)
Hemoglobin: 12.3 g/dL (ref 12.0–16.0)
LYMPHS ABS: 1.9 10*3/uL (ref 1.0–3.6)
Lymphocytes Relative: 28 %
MCH: 25.6 pg — ABNORMAL LOW (ref 26.0–34.0)
MCHC: 32.7 g/dL (ref 32.0–36.0)
MCV: 78.3 fL — ABNORMAL LOW (ref 80.0–100.0)
MONOS PCT: 6 %
Monocytes Absolute: 0.4 10*3/uL (ref 0.2–0.9)
NEUTROS PCT: 54 %
Neutro Abs: 3.7 10*3/uL (ref 1.4–6.5)
Platelets: 262 10*3/uL (ref 150–440)
RBC: 4.79 MIL/uL (ref 3.80–5.20)
RDW: 14.4 % (ref 11.5–14.5)
WBC: 6.9 10*3/uL (ref 3.6–11.0)

## 2015-09-29 MED ORDER — IPRATROPIUM-ALBUTEROL 18-103 MCG/ACT IN AERO: 1.0000 | INHALATION_SPRAY | RESPIRATORY_TRACT | Status: AC | PRN

## 2015-09-29 MED ORDER — AZITHROMYCIN 250 MG PO TABS: ORAL_TABLET | ORAL | Status: DC

## 2015-09-29 MED ORDER — PSEUDOEPHEDRINE HCL 60 MG PO TABS: 60.0000 mg | ORAL_TABLET | ORAL | Status: DC | PRN

## 2015-09-29 MED ORDER — IPRATROPIUM-ALBUTEROL 0.5-2.5 (3) MG/3ML IN SOLN
3.0000 mL | Freq: Once | RESPIRATORY_TRACT | Status: AC
  Administered 2015-09-29: 3 mL via RESPIRATORY_TRACT
  Filled 2015-09-29: qty 3

## 2015-09-29 MED ORDER — GUAIFENESIN-CODEINE 100-10 MG/5ML PO SOLN: 10.0000 mL | ORAL | Status: DC | PRN

## 2015-09-29 NOTE — ED Notes (Signed)
Pt reports for past 3 days cough, sore throat body aches , chills and sweats

## 2015-09-29 NOTE — Discharge Instructions (Signed)
Upper Respiratory Infection, Adult An upper respiratory infection (URI) is also sometimes known as the common cold. The upper respiratory tract includes the nose, sinuses, throat, trachea, and bronchi. Bronchi are the airways leading to the lungs. Most people improve within 1 week, but symptoms can last up to 2 weeks. A residual cough may last even longer.  CAUSES Many different viruses can infect the tissues lining the upper respiratory tract. The tissues become irritated and inflamed and often become very moist. Mucus production is also common. A cold is contagious. You can easily spread the virus to others by oral contact. This includes kissing, sharing a glass, coughing, or sneezing. Touching your mouth or nose and then touching a surface, which is then touched by another person, can also spread the virus. SYMPTOMS  Symptoms typically develop 1 to 3 days after you come in contact with a cold virus. Symptoms vary from person to person. They may include:  Runny nose.  Sneezing.  Nasal congestion.  Sinus irritation.  Sore throat.  Loss of voice (laryngitis).  Cough.  Fatigue.  Muscle aches.  Loss of appetite.  Headache.  Low-grade fever. DIAGNOSIS  You might diagnose your own cold based on familiar symptoms, since most people get a cold 2 to 3 times a year. Your caregiver can confirm this based on your exam. Most importantly, your caregiver can check that your symptoms are not due to another disease such as strep throat, sinusitis, pneumonia, asthma, or epiglottitis. Blood tests, throat tests, and X-rays are not necessary to diagnose a common cold, but they may sometimes be helpful in excluding other more serious diseases. Your caregiver will decide if any further tests are required. RISKS AND COMPLICATIONS  You may be at risk for a more severe case of the common cold if you smoke cigarettes, have chronic heart disease (such as heart failure) or lung disease (such as asthma), or if  you have a weakened immune system. The very young and very old are also at risk for more serious infections. Bacterial sinusitis, middle ear infections, and bacterial pneumonia can complicate the common cold. The common cold can worsen asthma and chronic obstructive pulmonary disease (COPD). Sometimes, these complications can require emergency medical care and may be life-threatening. PREVENTION  The best way to protect against getting a cold is to practice good hygiene. Avoid oral or hand contact with people with cold symptoms. Wash your hands often if contact occurs. There is no clear evidence that vitamin C, vitamin E, echinacea, or exercise reduces the chance of developing a cold. However, it is always recommended to get plenty of rest and practice good nutrition. TREATMENT  Treatment is directed at relieving symptoms. There is no cure. Antibiotics are not effective, because the infection is caused by a virus, not by bacteria. Treatment may include:  Increased fluid intake. Sports drinks offer valuable electrolytes, sugars, and fluids.  Breathing heated mist or steam (vaporizer or shower).  Eating chicken soup or other clear broths, and maintaining good nutrition.  Getting plenty of rest.  Using gargles or lozenges for comfort.  Controlling fevers with ibuprofen or acetaminophen as directed by your caregiver.  Increasing usage of your inhaler if you have asthma. Zinc gel and zinc lozenges, taken in the first 24 hours of the common cold, can shorten the duration and lessen the severity of symptoms. Pain medicines may help with fever, muscle aches, and throat pain. A variety of non-prescription medicines are available to treat congestion and runny nose. Your caregiver   can make recommendations and may suggest nasal or lung inhalers for other symptoms.  HOME CARE INSTRUCTIONS   Only take over-the-counter or prescription medicines for pain, discomfort, or fever as directed by your  caregiver.  Use a warm mist humidifier or inhale steam from a shower to increase air moisture. This may keep secretions moist and make it easier to breathe.  Drink enough water and fluids to keep your urine clear or pale yellow.  Rest as needed.  Return to work when your temperature has returned to normal or as your caregiver advises. You may need to stay home longer to avoid infecting others. You can also use a face mask and careful hand washing to prevent spread of the virus. SEEK MEDICAL CARE IF:   After the first few days, you feel you are getting worse rather than better.  You need your caregiver's advice about medicines to control symptoms.  You develop chills, worsening shortness of breath, or brown or red sputum. These may be signs of pneumonia.  You develop yellow or brown nasal discharge or pain in the face, especially when you bend forward. These may be signs of sinusitis.  You develop a fever, swollen neck glands, pain with swallowing, or white areas in the back of your throat. These may be signs of strep throat. SEEK IMMEDIATE MEDICAL CARE IF:   You have a fever.  You develop severe or persistent headache, ear pain, sinus pain, or chest pain.  You develop wheezing, a prolonged cough, cough up blood, or have a change in your usual mucus (if you have chronic lung disease).  You develop sore muscles or a stiff neck. Document Released: 06/09/2001 Document Revised: 03/07/2012 Document Reviewed: 03/21/2014 [employer] Patient Information 2015 [employer]. This information is not intended to replace advice given to you by your health care provider. Make sure you discuss any questions you have with your health care provider.  

## 2015-09-29 NOTE — ED Notes (Signed)
Sore throat ,cough and body aches for the past few days ..some fever..cough is non prod

## 2015-09-29 NOTE — ED Notes (Signed)
NAD noted at time of d/c. Pt refused wheelchair to the lobby. Denies comments/concerns at this time.

## 2015-09-29 NOTE — ED Provider Notes (Signed)
[facility] Emergency Department Provider Note  ____________________________________________  Time seen: Approximately 11:48 AM  I have reviewed the triage vital signs and the nursing notes.   HISTORY  Chief Complaint URI    HPI Elizabeth Coffey is a 54 y.o. female resents for evaluation of sore throat, body aches for the past 3 days. Patient states her cough is nonproductive but feels like her breath is being taken away when she talks. Increased shortness of breath.   Past Medical History  Diagnosis Date  . Hypertension   . Anxiety   . Peptic ulcer   . Arthritis     There are no active problems to display for this patient.   Past Surgical History  Procedure Laterality Date  . Abdominal hysterectomy    . Breast cyst excision      Current Outpatient Rx  Name  Route  Sig  Dispense  Refill  . albuterol-ipratropium (COMBIVENT) 18-103 MCG/ACT inhaler   Inhalation   Inhale 1 puff into the lungs every 4 (four) hours as needed for wheezing or shortness of breath.   1 Inhaler   2   . amLODipine-benazepril (LOTREL) 10-20 MG per capsule   Oral   Take 1 capsule by mouth daily.         Marland Kitchen amLODipine-benazepril (LOTREL) 5-40 MG per capsule   Oral   Take 1 capsule by mouth daily.         Marland Kitchen azithromycin (ZITHROMAX Z-PAK) 250 MG tablet      Take 2 tablets (500 mg) on  Day 1,  followed by 1 tablet (250 mg) once daily on Days 2 through 5.   6 each   0   . benzonatate (TESSALON PERLES) 100 MG capsule   Oral   Take 1 capsule (100 mg total) by mouth 3 (three) times daily as needed for cough.   15 capsule   0   . etodolac (LODINE) 500 MG tablet   Oral   Take 500 mg by mouth 2 (two) times daily.         Marland Kitchen FLUoxetine (PROZAC) 20 MG capsule   Oral   Take 20 mg by mouth daily.         Marland Kitchen guaiFENesin-codeine 100-10 MG/5ML syrup   Oral   Take 10 mLs by mouth every 4 (four) hours as needed for cough.   180 mL   0   . hydrOXYzine  (ATARAX/VISTARIL) 10 MG tablet   Oral   Take 1 tablet (10 mg total) by mouth 3 (three) times daily as needed.   30 tablet   0   . hydrOXYzine (ATARAX/VISTARIL) 25 MG tablet   Oral   Take 25 mg by mouth 3 (three) times daily as needed.         . meloxicam (MOBIC) 15 MG tablet   Oral   Take 15 mg by mouth daily.         Marland Kitchen omeprazole (PRILOSEC) 40 MG capsule   Oral   Take 40 mg by mouth daily.         . permethrin (ELIMITE) 5 % cream   Topical   Apply 1 application topically once.   60 g   1   . pseudoephedrine (SUDAFED) 60 MG tablet   Oral   Take 1 tablet (60 mg total) by mouth every 4 (four) hours as needed for congestion.   24 tablet   0   . triamterene-hydrochlorothiazide (MAXZIDE-25) 37.5-25 MG per tablet  Oral   Take 1 tablet by mouth daily.           Allergies Aspirin; Mobic; Tramadol; and Chocolate  No family history on file.  Social History Social History  Substance Use Topics  . Smoking status: Never Smoker   . Smokeless tobacco: None  . Alcohol Use: No    Review of Systems Constitutional: Positive fever/chills Eyes: No visual changes. ENT: Positive for sore throat. Cardiovascular: Denies chest pain. Respiratory: Positive for shortness of breath Gastrointestinal: No abdominal pain.  No nausea, no vomiting.  No diarrhea.  No constipation. Genitourinary: Negative for dysuria. Musculoskeletal: Negative for back pain. Skin: Negative for rash. Neurological: Negative for headaches, focal weakness or numbness. Positive for generalized myalgia.  10-point ROS otherwise negative.  ____________________________________________   PHYSICAL EXAM:  VITAL SIGNS: ED Triage Vitals  Enc Vitals Group     BP 09/29/15 1125 160/99 mmHg     Pulse Rate 09/29/15 1125 66     Resp 09/29/15 1125 22     Temp 09/29/15 1125 98.2 F (36.8 C)     Temp Source 09/29/15 1125 Oral     SpO2 09/29/15 1125 98 %     Weight 09/29/15 1125 199 lb (90.266 kg)      Height 09/29/15 1125  (1.626 m)     Head Cir --      Peak Flow --      Pain Score 09/29/15 1127 10     Pain Loc --      Pain Edu? --      Excl. in GC? --     Constitutional: Alert and oriented. Well appearing and in no acute distress. Eyes: Conjunctivae are normal. PERRL. EOMI. Head: Atraumatic. Nose: Positive congestion/rhinnorhea. Mild maxillary sinus tenderness bilaterally. Mouth/Throat: Mucous membranes are moist.  Oropharynx non-erythematous. Neck: No stridor.   Cardiovascular: Normal rate, regular rhythm. Grossly normal heart sounds.  Good peripheral circulation. Respiratory: Normal respiratory effort.  No retractions. Lungs CTAB. He comes extremely short of breath when carrying on a conversation. He can talk about 3 or 4 words. For feels tight Gastrointestinal: Soft and nontender. No distention. No abdominal bruits. No CVA tenderness. Musculoskeletal: No lower extremity tenderness nor edema.  No joint effusions. Neurologic:  Normal speech and language. No gross focal neurologic deficits are appreciated. No gait instability. Skin:  Skin is warm, dry and intact. No rash noted. Psychiatric: Mood and affect are normal. Speech and behavior are normal.  ____________________________________________   LABS (all labs ordered are listed, but only abnormal results are displayed)  Labs Reviewed  CBC WITH DIFFERENTIAL/PLATELET - Abnormal; Notable for the following:    MCV 78.3 (*)    MCH 25.6 (*)    All other components within normal limits   ____________________________________________    RADIOLOGY  Negative for pneumonia or bronchitis. Interpreted by radiologist and reviewed by myself. ____________________________________________   PROCEDURES  Procedure(s) performed: None  Critical Care performed: No  ____________________________________________   INITIAL IMPRESSION / ASSESSMENT AND PLAN / ED COURSE  Pertinent labs & imaging results that were available during  my care of the patient were reviewed by me and considered in my medical decision making (see chart for details).  Acute upper respiratory infection patient states relief with DuoNeb treatment. Rx given for Combivent, Zithromax, Robitussin-AC, and Sudafed. Patient follow-up with PCP or return to the ER as needed.  Patient voices no other emergency medical complaints at this time. ____________________________________________   FINAL CLINICAL IMPRESSION(S) / ED DIAGNOSES  Final diagnoses:  Acute URI     Evangeline Dakin, PA-C 09/29/15 1333  Governor Rooks, MD 09/30/15 [phone]

## 2016-01-06 DIAGNOSIS — R928 Other abnormal and inconclusive findings on diagnostic imaging of breast: Secondary | ICD-10-CM | POA: Insufficient documentation

## 2016-03-30 ENCOUNTER — Emergency Department: Payer: Medicare HMO

## 2016-03-30 ENCOUNTER — Emergency Department
Admission: EM | Admit: 2016-03-30 | Discharge: 2016-03-30 | Disposition: A | Discharge status: Home / Self Care | Payer: Medicare HMO | Attending: Student | Admitting: Student

## 2016-03-30 ENCOUNTER — Encounter: Payer: Self-pay | Admitting: Emergency Medicine

## 2016-03-30 DIAGNOSIS — R42 Dizziness and giddiness: Secondary | ICD-10-CM | POA: Insufficient documentation

## 2016-03-30 DIAGNOSIS — R0602 Shortness of breath: Secondary | ICD-10-CM | POA: Diagnosis not present

## 2016-03-30 DIAGNOSIS — R0789 Other chest pain: Secondary | ICD-10-CM | POA: Insufficient documentation

## 2016-03-30 DIAGNOSIS — Z79899 Other long term (current) drug therapy: Secondary | ICD-10-CM | POA: Insufficient documentation

## 2016-03-30 DIAGNOSIS — R079 Chest pain, unspecified: Secondary | ICD-10-CM

## 2016-03-30 DIAGNOSIS — I10 Essential (primary) hypertension: Secondary | ICD-10-CM | POA: Insufficient documentation

## 2016-03-30 LAB — CBC
HEMATOCRIT: 35.2 % (ref 35.0–47.0)
HEMOGLOBIN: 11.9 g/dL — AB (ref 12.0–16.0)
MCH: 27.1 pg (ref 26.0–34.0)
MCHC: 33.8 g/dL (ref 32.0–36.0)
MCV: 80.2 fL (ref 80.0–100.0)
Platelets: 296 10*3/uL (ref 150–440)
RBC: 4.39 MIL/uL (ref 3.80–5.20)
RDW: 14.9 % — AB (ref 11.5–14.5)
WBC: 6.9 10*3/uL (ref 3.6–11.0)

## 2016-03-30 LAB — TROPONIN I
Troponin I: <0.03 ng/mL (ref <0.031)
Troponin I: <0.03 ng/mL (ref <0.031)

## 2016-03-30 LAB — COMPREHENSIVE METABOLIC PANEL
ALBUMIN: 3.9 g/dL (ref 3.5–5.0)
ALK PHOS: 86 U/L (ref 38–126)
ALT: 18 U/L (ref 14–54)
ANION GAP: 6 (ref 5–15)
AST: 23 U/L (ref 15–41)
BILIRUBIN TOTAL: 0.4 mg/dL (ref 0.3–1.2)
BUN: 23 mg/dL — AB (ref 6–20)
CALCIUM: 9.3 mg/dL (ref 8.9–10.3)
CO2: 23 mmol/L (ref 22–32)
Chloride: 106 mmol/L (ref 101–111)
Creatinine, Ser: 0.78 mg/dL (ref 0.44–1.00)
GFR calc Af Amer: >60 mL/min (ref >60)
GFR calc non Af Amer: >60 mL/min (ref >60)
GLUCOSE: 93 mg/dL (ref 65–99)
POTASSIUM: 3 mmol/L — AB (ref 3.5–5.1)
SODIUM: 135 mmol/L (ref 135–145)
Total Protein: 7.4 g/dL (ref 6.5–8.1)

## 2016-03-30 MED ORDER — LORAZEPAM 1 MG PO TABS
1.0000 mg | ORAL_TABLET | Freq: Once | ORAL | Status: AC
  Administered 2016-03-30: 1 mg via ORAL
  Filled 2016-03-30: qty 1

## 2016-03-30 MED ORDER — SODIUM CHLORIDE 0.9 % IV BOLUS (SEPSIS)
1000.0000 mL | Freq: Once | INTRAVENOUS | Status: AC
  Administered 2016-03-30: 1000 mL via INTRAVENOUS

## 2016-03-30 MED ORDER — ACETAMINOPHEN 500 MG PO TABS
1000.0000 mg | ORAL_TABLET | Freq: Once | ORAL | Status: AC
  Administered 2016-03-30: 1000 mg via ORAL
  Filled 2016-03-30: qty 2

## 2016-03-30 NOTE — ED Notes (Signed)
States intermittent chest pain x 2 months, returned 9am. States dizzyness,

## 2016-03-30 NOTE — ED Provider Notes (Signed)
[facility] Emergency Department Provider Note  ____________________________________________  Time seen: Approximately 2:26 PM  I have reviewed the triage vital signs and the nursing notes.   HISTORY  Chief Complaint Chest Pain    HPI Elizabeth Coffey is a 55 y.o. female with hypertension, anxiety, breast fibroadenoma who presents for evaluation of intermittent central chest pain over the past 2 days associated with shortness of breath, lightheadedness, feeling flushed and anxious, intermittent, currently mild. Patient reports that she had one of these episodes yesterday while she was at church, it resolved. Today while she was trying to make a difficult decision about "losing my apartment" she developed a similar episode where she felt lightheaded and short of breath and developed this sharp central chest pain that is nonradiating, nonexertional and nonpleuritic. She also felt "like I couldn't talk". This was not associated with any nausea, no vomiting, diarrhea, fevers or chills. No cough. She reports she has had anxiety attacks in the past and this episode feels similar. She denies any history of PE or DVT, no leg swelling, hemoptysis, exogenous estrogens, recent prolonged period of immobilization or surgery. 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 Juliann Paresallwood did it but I haven't had any problems with my heart since then."   Past Medical History  Diagnosis Date  . Hypertension   . Anxiety   . Peptic ulcer   . Arthritis     There are no active problems to display for this patient.   Past Surgical History  Procedure Laterality Date  . Abdominal hysterectomy    . Breast cyst excision      Current Outpatient Rx  Name  Route  Sig  Dispense  Refill  . albuterol-ipratropium (COMBIVENT) 18-103 MCG/ACT inhaler   Inhalation   Inhale 1 puff into the lungs every 4 (four) hours as needed  for wheezing or shortness of breath.   1 Inhaler   2   . amLODipine-benazepril (LOTREL) 10-20 MG per capsule   Oral   Take 1 capsule by mouth daily.         Marland Kitchen. amLODipine-benazepril (LOTREL) 5-40 MG per capsule   Oral   Take 1 capsule by mouth daily.         Marland Kitchen. azithromycin (ZITHROMAX Z-PAK) 250 MG tablet      Take 2 tablets (500 mg) on  Day 1,  followed by 1 tablet (250 mg) once daily on Days 2 through 5.   6 each   0   . benzonatate (TESSALON PERLES) 100 MG capsule   Oral   Take 1 capsule (100 mg total) by mouth 3 (three) times daily as needed for cough.   15 capsule   0   . etodolac (LODINE) 500 MG tablet   Oral   Take 500 mg by mouth 2 (two) times daily.         Marland Kitchen. FLUoxetine (PROZAC) 20 MG capsule   Oral   Take 20 mg by mouth daily.         Marland Kitchen. guaiFENesin-codeine 100-10 MG/5ML syrup   Oral   Take 10 mLs by mouth every 4 (four) hours as needed for cough.   180 mL   0   . hydrOXYzine (ATARAX/VISTARIL) 10 MG tablet   Oral   Take 1 tablet (10 mg total) by mouth 3 (three) times daily as needed.   30 tablet   0   .  hydrOXYzine (ATARAX/VISTARIL) 25 MG tablet   Oral   Take 25 mg by mouth 3 (three) times daily as needed.         . meloxicam (MOBIC) 15 MG tablet   Oral   Take 15 mg by mouth daily.         Marland Kitchen omeprazole (PRILOSEC) 40 MG capsule   Oral   Take 40 mg by mouth daily.         . permethrin (ELIMITE) 5 % cream   Topical   Apply 1 application topically once.   60 g   1   . pseudoephedrine (SUDAFED) 60 MG tablet   Oral   Take 1 tablet (60 mg total) by mouth every 4 (four) hours as needed for congestion.   24 tablet   0   . triamterene-hydrochlorothiazide (MAXZIDE-25) 37.5-25 MG per tablet   Oral   Take 1 tablet by mouth daily.           Allergies Aspirin; Mobic; Tramadol; and Chocolate  No family history on file.  Social History Social History  Substance Use Topics  . Smoking status: Never Smoker   . Smokeless tobacco:  None  . Alcohol Use: No    Review of Systems Constitutional: No fever/chills Eyes: No visual changes. ENT: No sore throat. Cardiovascular: + chest pain. Respiratory: +shortness of breath. Gastrointestinal: No abdominal pain.  No nausea, no vomiting.  No diarrhea.  No constipation. Genitourinary: Negative for dysuria. Musculoskeletal: Negative for back pain. Skin: Negative for rash. Neurological: Negative for headaches, focal weakness or numbness.  10-point ROS otherwise negative.  ____________________________________________   PHYSICAL EXAM:  VITAL SIGNS: ED Triage Vitals  Enc Vitals Group     BP 03/30/16 1403 121/93 mmHg     Pulse Rate 03/30/16 1403 73     Resp 03/30/16 1403 18     Temp 03/30/16 1403 98.4 F (36.9 C)     Temp Source 03/30/16 1403 Oral     SpO2 03/30/16 1403 97 %     Weight 03/30/16 1403 190 lb (86.183 kg)     Height 03/30/16 1403  (1.626 m)     Head Cir --      Peak Flow --      Pain Score 03/30/16 1406 4     Pain Loc --      Pain Edu? --      Excl. in GC? --     Constitutional: Alert and oriented. Nontoxic-appearing and in no acute distress. Eyes: Conjunctivae are normal. PERRL. EOMI. Head: Atraumatic. Nose: No congestion/rhinnorhea. Mouth/Throat: Mucous membranes are moist.  Oropharynx non-erythematous. Neck: No stridor.  Supple without meningismus. Cardiovascular: Normal rate, regular rhythm. Grossly normal heart sounds.  Good peripheral circulation. Respiratory: Normal respiratory effort.  No retractions. Lungs CTAB. Gastrointestinal: Soft and nontender. No distention. No CVA tenderness. Genitourinary: deferred Musculoskeletal: No lower extremity tenderness nor edema.  No joint effusions. No calf Tenderness/swelling/asymmetry. Tenderness in the chest wall at the left upper sternal border, palpation reproduces her pain. Neurologic:  Normal speech and language. No gross focal neurologic deficits are appreciated. No gait instability. 5 out  of 5 strength bilateral upper and lower extremity, sensation intact to light touch throughout. Skin:  Skin is warm, dry and intact. No rash noted. Psychiatric: Mood and affect are normal. Speech and behavior are normal.  ____________________________________________   LABS (all labs ordered are listed, but only abnormal results are displayed)  Labs Reviewed  CBC - Abnormal; Notable for the following:  Hemoglobin 11.9 (*)    RDW 14.9 (*)    All other components within normal limits  COMPREHENSIVE METABOLIC PANEL - Abnormal; Notable for the following:    Potassium 3.0 (*)    BUN 23 (*)    All other components within normal limits  TROPONIN I   ____________________________________________  EKG  ED ECG REPORT I, Gayla Doss, the attending physician, personally viewed and interpreted this ECG.   Date: 03/30/2016  EKG Time: 14:04  Rate: 74  Rhythm: normal sinus rhythm  Axis: normal  Intervals:none  ST&T Change: No acute ST elevation. No acute ST depression.  ____________________________________________  RADIOLOGY  CXR - pending ____________________________________________   PROCEDURES  Procedure(s) performed: None  Critical Care performed: No  ____________________________________________   INITIAL IMPRESSION / ASSESSMENT AND PLAN / ED COURSE  Pertinent labs & imaging results that were available during my care of the patient were reviewed by me and considered in my medical decision making (see chart for details).  Elizabeth Coffey is a 55 y.o. female with hypertension, anxiety, breast fibroadenoma who presents for evaluation of intermittent central chest pain over the past 2 days associated with shortness of breath, lightheadedness, feeling flushed and anxious, intermittent, currently mild. On exam, she is nontoxic appearing and in no acute distress. Vital signs stable, she is afebrile. She does have tenderness to palpation at the left upper sternal border and  palpation appears to reproduce her pain. I suspect her symptoms today are secondary to anxiety/panic attack. Clinical picture not consistent with PE or acute aortic dissection. We'll obtain screening cardiac labs and perform 2 sets of cardiac enzymes given her history of "angio-thing" though no documented history of coronary artery disease. We'll obtain chest x-ray, treat her symptomatically and reassess for disposition. EKG reassuring.  ----------------------------------------- 3:10 PM on 03/30/2016 ----------------------------------------- CBC with mild anemia, hemoglobin 11.9. CMP with mild hyponatremia, potassium 3.0, we'll replete. First troponin negative. Chest x-ray pending. Care transferred to Dr. Derrill Kay at this time.  ____________________________________________   FINAL CLINICAL IMPRESSION(S) / ED DIAGNOSES  Final diagnoses:  Chest pain, unspecified chest pain type      Gayla Doss, MD 03/30/16 1511

## 2016-03-30 NOTE — Discharge Instructions (Signed)
Please seek medical attention for any high fevers, chest pain, shortness of breath, change in behavior, persistent vomiting, bloody stool or any other new or concerning symptoms. ° ° °Nonspecific Chest Pain °It is often hard to find the cause of chest pain. There is always a chance that your pain could be related to something serious, such as a heart attack or a blood clot in your lungs. Chest pain can also be caused by conditions that are not life-threatening. If you have chest pain, it is very important to follow up with your doctor. ° °HOME CARE °· If you were prescribed an antibiotic medicine, finish it all even if you start to feel better. °· Avoid any activities that cause chest pain. °· Do not use any tobacco products, including cigarettes, chewing tobacco, or electronic cigarettes. If you need help quitting, ask your doctor. °· Do not drink alcohol. °· Take medicines only as told by your doctor. °· Keep all follow-up visits as told by your doctor. This is important. This includes any further testing if your chest pain does not go away. °· Your doctor may tell you to keep your head raised (elevated) while you sleep. °· Make lifestyle changes as told by your doctor. These may include: °¨ Getting regular exercise. Ask your doctor to suggest some activities that are safe for you. °¨ Eating a heart-healthy diet. Your doctor or a diet specialist (dietitian) can help you to learn healthy eating options. °¨ Maintaining a healthy weight. °¨ Managing diabetes, if necessary. °¨ Reducing stress. °GET HELP IF: °· Your chest pain does not go away, even after treatment. °· You have a rash with blisters on your chest. °· You have a fever. °GET HELP RIGHT AWAY IF: °· Your chest pain is worse. °· You have an increasing cough, or you cough up blood. °· You have severe belly (abdominal) pain. °· You feel extremely weak. °· You pass out (faint). °· You have chills. °· You have sudden, unexplained chest discomfort. °· You have  sudden, unexplained discomfort in your arms, back, neck, or jaw. °· You have shortness of breath at any time. °· You suddenly start to sweat, or your skin gets clammy. °· You feel nauseous. °· You vomit. °· You suddenly feel light-headed or dizzy. °· Your heart begins to beat quickly, or it feels like it is skipping beats. °These symptoms may be an emergency. Do not wait to see if the symptoms will go away. Get medical help right away. Call your local emergency services (911 in the [place] Do not drive yourself to the hospital. °  °This information is not intended to replace advice given to you by your health care provider. Make sure you discuss any questions you have with your health care provider. °  °Document Released: 06/01/2008 Document Revised: 01/04/2015 Document Reviewed: 07/20/2014 °Elsevier Interactive Patient Education ©2016 [employer] ° °

## 2016-03-31 ENCOUNTER — Telehealth: Payer: Self-pay | Admitting: Cardiovascular Disease

## 2016-03-31 NOTE — Telephone Encounter (Signed)
lmov to make ED fu from 03/30/16

## 2016-05-08 ENCOUNTER — Emergency Department: Admit: 2016-05-08 | Payer: MEDICARE

## 2016-05-08 ENCOUNTER — Inpatient Hospital Stay
Admit: 2016-05-08 | Discharge: 2016-05-08 | Disposition: A | Discharge status: Home / Self Care | Payer: MEDICARE | Attending: Emergency Medicine

## 2016-05-08 DIAGNOSIS — R0789 Other chest pain: Secondary | ICD-10-CM

## 2016-05-08 LAB — DRUG SCREEN, URINE
AMPHETAMINES: NEGATIVE
BARBITURATES: NEGATIVE
BENZODIAZEPINES: NEGATIVE
COCAINE: NEGATIVE
METHADONE: NEGATIVE
OPIATES: NEGATIVE
PCP(PHENCYCLIDINE): NEGATIVE
THC (TH-CANNABINOL): NEGATIVE

## 2016-05-08 LAB — URINALYSIS W/ RFLX MICROSCOPIC
Bilirubin: NEGATIVE
Blood: NEGATIVE
Glucose: NEGATIVE mg/dL
Ketone: NEGATIVE mg/dL
Nitrites: NEGATIVE
Protein: 30 mg/dL — AB
Specific gravity: 1.023 (ref 1.005–1.030)
Urobilinogen: 1 EU/dL (ref 0.2–1.0)
pH (UA): 6.5 (ref 5.0–8.0)

## 2016-05-08 LAB — METABOLIC PANEL, COMPREHENSIVE
A-G Ratio: 0.9 (ref 0.8–1.7)
ALT (SGPT): 28 U/L (ref 13–56)
AST (SGOT): 23 U/L (ref 15–37)
Albumin: 3.9 g/dL (ref 3.4–5.0)
Alk. phosphatase: 92 U/L (ref 45–117)
Anion gap: 6 mmol/L (ref 3.0–18)
BUN/Creatinine ratio: 22 — ABNORMAL HIGH (ref 12–20)
BUN: 16 MG/DL (ref 7.0–18)
Bilirubin, total: 0.3 MG/DL (ref 0.2–1.0)
CO2: 31 mmol/L (ref 21–32)
Calcium: 10 MG/DL (ref 8.5–10.1)
Chloride: 108 mmol/L (ref 100–108)
Creatinine: 0.73 MG/DL (ref 0.6–1.3)
GFR est AA: >60 mL/min/{1.73_m2} (ref >60)
GFR est non-AA: >60 mL/min/{1.73_m2} (ref >60)
Globulin: 4.2 g/dL — ABNORMAL HIGH (ref 2.0–4.0)
Glucose: 88 mg/dL (ref 74–99)
Potassium: 3.2 mmol/L — ABNORMAL LOW (ref 3.5–5.5)
Protein, total: 8.1 g/dL (ref 6.4–8.2)
Sodium: 145 mmol/L (ref 136–145)

## 2016-05-08 LAB — EKG, 12 LEAD, INITIAL
Atrial Rate: 52 {beats}/min
Calculated P Axis: 46 degrees
Calculated R Axis: -15 degrees
Calculated T Axis: 32 degrees
P-R Interval: 166 ms
Q-T Interval: 468 ms
QRS Duration: 80 ms
QTC Calculation (Bezet): 435 ms
Ventricular Rate: 52 {beats}/min

## 2016-05-08 LAB — CBC WITH AUTOMATED DIFF
ABS. BASOPHILS: 0 10*3/uL (ref 0.0–0.06)
ABS. EOSINOPHILS: 0.2 10*3/uL (ref 0.0–0.4)
ABS. LYMPHOCYTES: 1.6 10*3/uL (ref 0.9–3.6)
ABS. MONOCYTES: 0.3 10*3/uL (ref 0.05–1.2)
ABS. NEUTROPHILS: 3 10*3/uL (ref 1.8–8.0)
BASOPHILS: 0 % (ref 0–2)
EOSINOPHILS: 4 % (ref 0–5)
HCT: 40.9 % (ref 35.0–45.0)
HGB: 13.3 g/dL (ref 12.0–16.0)
LYMPHOCYTES: 32 % (ref 21–52)
MCH: 26.8 PG (ref 24.0–34.0)
MCHC: 32.5 g/dL (ref 31.0–37.0)
MCV: 82.3 FL (ref 74.0–97.0)
MONOCYTES: 5 % (ref 3–10)
MPV: 9.6 FL (ref 9.2–11.8)
NEUTROPHILS: 59 % (ref 40–73)
PLATELET: 253 10*3/uL (ref 135–420)
RBC: 4.97 M/uL (ref 4.20–5.30)
RDW: 14.5 % (ref 11.6–14.5)
WBC: 5.1 10*3/uL (ref 4.6–13.2)

## 2016-05-08 LAB — URINE MICROSCOPIC ONLY: WBC: 6 /hpf (ref 0–4)

## 2016-05-08 LAB — CARDIAC PANEL,(CK, CKMB & TROPONIN)
CK - MB: 1.3 ng/ml (ref <3.6)
CK-MB Index: 0.7 % (ref 0.0–4.0)
CK: 186 U/L (ref 26–192)
Troponin-I, QT: <0.02 NG/ML (ref 0.0–0.045)

## 2016-05-08 LAB — LIPASE: Lipase: 183 U/L (ref 73–393)

## 2016-05-08 MED ORDER — CEPHALEXIN 500 MG CAP
500 mg | ORAL_CAPSULE | Freq: Two times a day (BID) | ORAL | 0 refills | Status: AC
Start: 2016-05-08 — End: 2016-05-15

## 2016-05-08 NOTE — ED Triage Notes (Deleted)
Pt. C/o on & off generalized body, abdominal  pain aches for a month

## 2016-05-08 NOTE — ED Triage Notes (Signed)
Pt, c/o on & off  Generalized  Body aches ,  Abdominal pain  For a month

## 2016-05-08 NOTE — ED Provider Notes (Signed)
HPI Comments: 55yo female with history of HTN and anxiety/depression [place] via private vehicle with multiple complaints.  Complains of chest pain, neck pain, back pain, bilateral upper arm pain, and abdominal pain.   All symptoms started last month.  Patient states her fiance who brought who her here is also here as a patient for hernia pain.     Patient is a [age] y.o. female presenting with abdominal pain and general illness.   Abdominal Pain    Associated symptoms include arthralgias, myalgias, chest pain and back pain. Pertinent negatives include no fever, no diarrhea, no nausea, no vomiting, no dysuria and no headaches.   Generalized Body Aches   Associated symptoms include chest pain and abdominal pain. Pertinent negatives include no headaches and no shortness of breath.        No past medical history on file.    No past surgical history on file.      No family history on file.    Social History     Social History   ??? Marital status: N/A     Spouse name: N/A   ??? Number of children: N/A   ??? Years of education: N/A     Occupational History   ??? Not on file.     Social History Main Topics   ??? Smoking status: Not on file   ??? Smokeless tobacco: Not on file   ??? Alcohol use Not on file   ??? Drug use: Not on file   ??? Sexual activity: Not on file     Other Topics Concern   ??? Not on file     Social History Narrative         ALLERGIES: Review of patient's allergies indicates no known allergies.    Review of Systems   Constitutional: Negative for activity change, appetite change, chills, fatigue and fever.   HENT: Positive for sore throat. Negative for congestion and rhinorrhea.    Eyes: Negative.    Respiratory: Negative for chest tightness and shortness of breath.    Cardiovascular: Positive for chest pain.   Gastrointestinal: Positive for abdominal pain. Negative for diarrhea, nausea and vomiting.   Genitourinary: Negative for difficulty urinating and dysuria.    Musculoskeletal: Positive for arthralgias, back pain, myalgias and neck pain. Negative for gait problem and joint swelling.   Skin: Negative for rash.   Neurological: Negative for weakness, light-headedness, numbness and headaches.   All other systems reviewed and are negative.      There were no vitals filed for this visit.         Physical Exam   Constitutional: She is oriented to person, place, and time. She appears well-developed. No distress.   Slightly overweight   HENT:   Mouth/Throat: Oropharynx is clear and moist.   Eyes: Conjunctivae and EOM are normal. Pupils are equal, round, and reactive to light.   Neck: Normal range of motion. Neck supple. No thyromegaly present.   No focal muscular or vertebral TTP.  Normal ROM without evidence of pain.    Cardiovascular: Regular rhythm and normal heart sounds.  Bradycardia present.    No murmur heard.  Pulmonary/Chest: Effort normal and breath sounds normal. She has no wheezes. She exhibits no tenderness.   Abdominal: Soft. Bowel sounds are normal. She exhibits no distension and no mass. There is no tenderness. There is no rebound and no guarding.   Musculoskeletal: Normal range of motion. She  exhibits no edema.   Bilateral upper arm TTP, soft, no discoloration, no swelling.  Sensation normal, radial pulses 2+ bilaterally.  Normal active ROM without evidence of discomfort.     No focal area of TTP of thoracic or lumbar back.  Moves without evidence of pain.    Lymphadenopathy:     She has no cervical adenopathy.   Neurological: She is alert and oriented to person, place, and time. No cranial nerve deficit. Coordination normal.   Skin: Skin is warm and dry. She is not diaphoretic.   Psychiatric: She has a normal mood and affect.   Nursing note and vitals reviewed.       MDM  Number of Diagnoses or Management Options  Diagnosis management comments: 55yo female with multiple complaints.  Unable to reproduce back, neck, abdominal pain.  Bilateral upper arms with  mild TTP, soft, NV intact.  Patient states chest pain started last month.  Low suspicion for cardiopulmonary process causing discomfort.  EKG without ischemic pattern, CXR normal, cardiac labs and basic labs normal.  UA possible UTI.  Rx for keflex.  [facility] referral.     ED Course       Procedures     EKG- SINUS BRADYCARDIA, RATE 52, NORMAL AXIS.  NO STEMI.  CXR-

## 2016-05-08 NOTE — ED Triage Notes (Signed)
[age] YOF OOT from [place] says she has a history of angina and stomach ulcers and has had left arm pain and generalized abdominal pain for a month.  She presents here appearing stable.        I have started some orders on her to help expedite her care.

## 2016-05-08 NOTE — ED Triage Notes (Signed)
Kendra Velez is a 55 y.o. female that was discharged in stable condition.  The patients diagnosis, condition and treatment were explained to  patient and aftercare instructions were given.  The patient verbalized understanding. Patient armband removed and shredded.

## 2016-06-30 IMAGING — CR DG CHEST 2V
1 series · 2 of 2 positions shown · non-contrast
Comparison: 09/07/2015

CLINICAL DATA: Sore throat. Cough and body aches for the past few
days. Fever. Nonproductive cough. Nonsmoker.

EXAM:
CHEST  2 VIEW

[Series 1: w chest pa · 0.14mm/px · 2 of 2 slices shown]
[im 1/2]
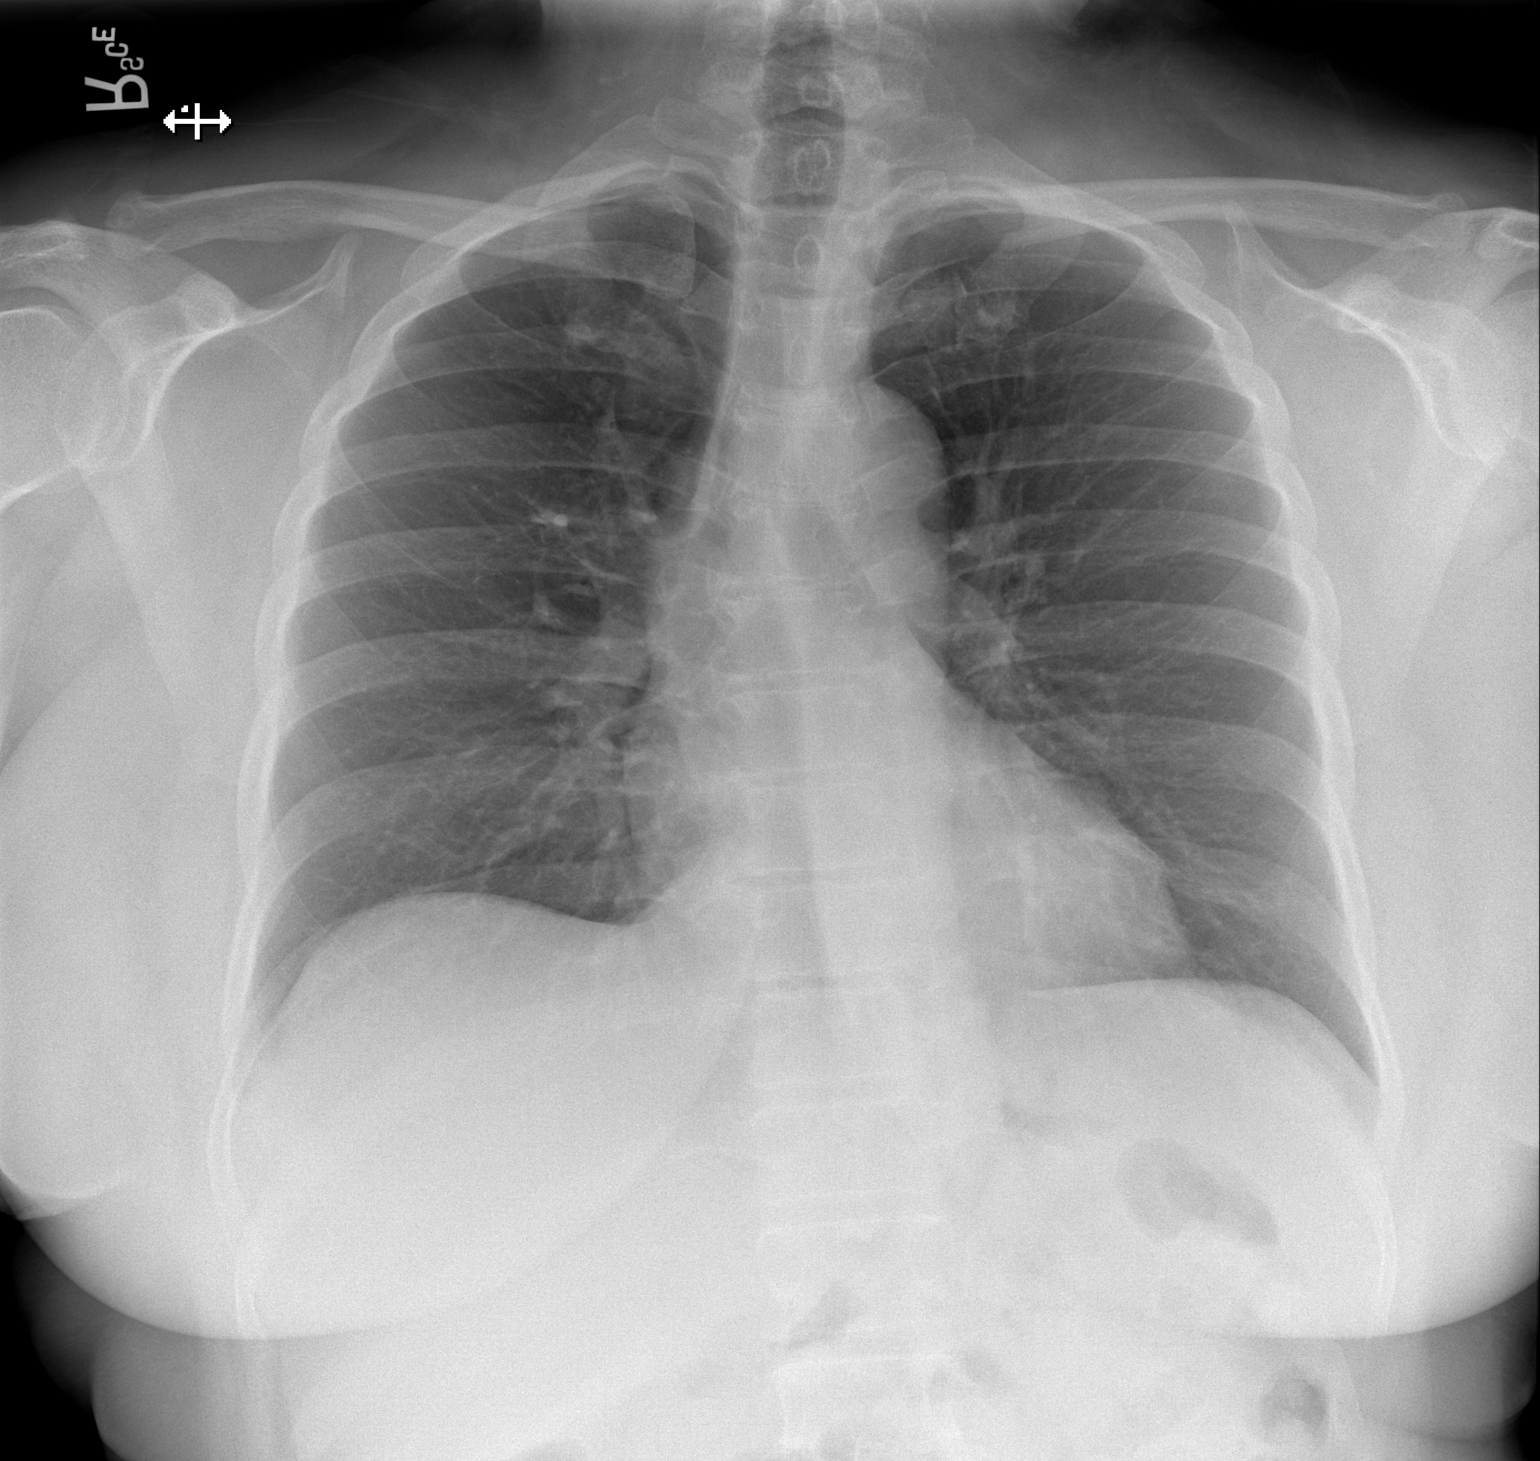
[im 2/2]
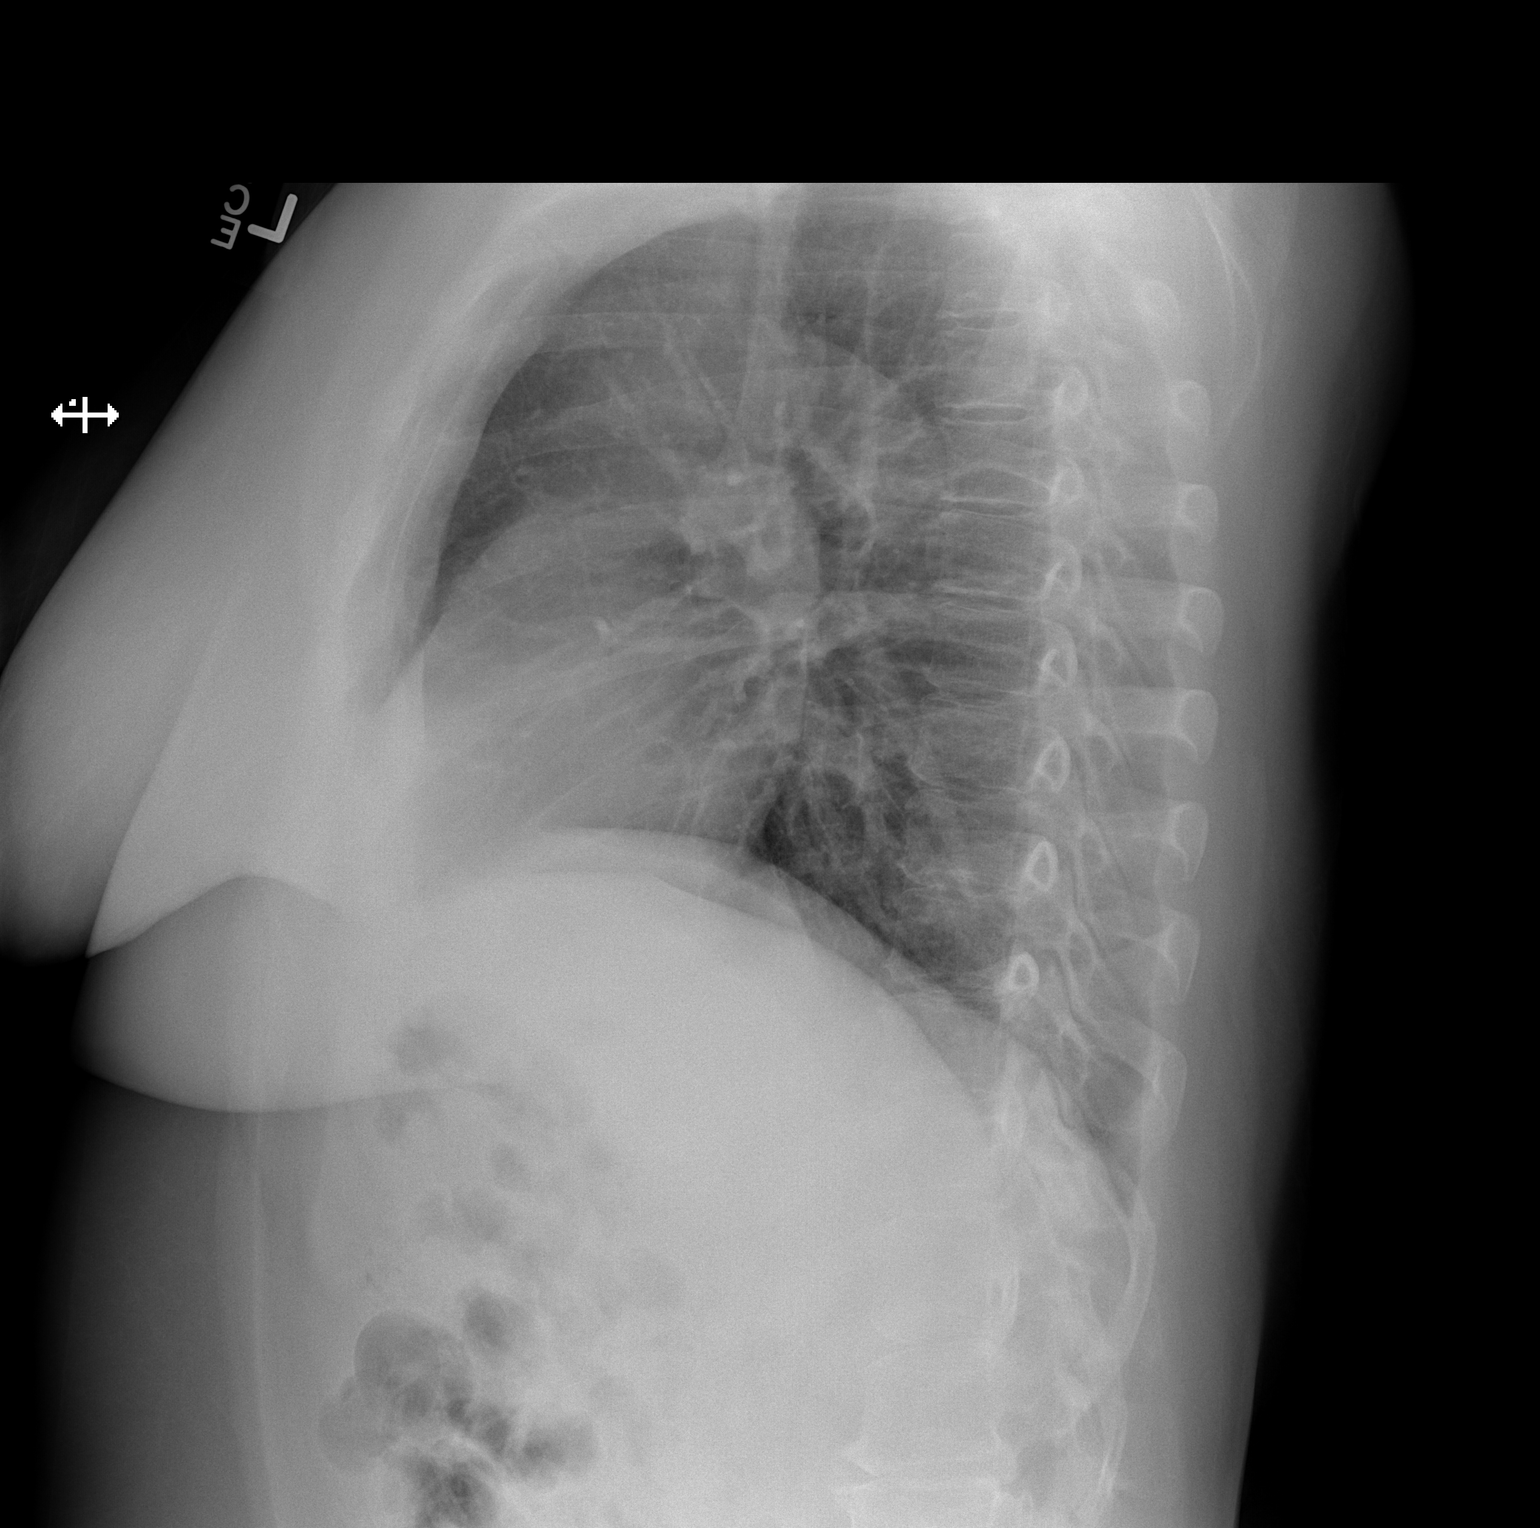

[2 of 2 positions shown; findings below may reference images not displayed]

FINDINGS: Midline trachea. Normal heart size and mediastinal contours. No
pleural effusion or pneumothorax. Clear lungs.
IMPRESSION: No acute cardiopulmonary disease.

## 2016-08-03 ENCOUNTER — Emergency Department
Admission: EM | Admit: 2016-08-03 | Discharge: 2016-08-03 | Disposition: A | Discharge status: Home / Self Care | Payer: Medicare HMO | Attending: Emergency Medicine | Admitting: Emergency Medicine

## 2016-08-03 ENCOUNTER — Encounter: Payer: Self-pay | Admitting: Emergency Medicine

## 2016-08-03 DIAGNOSIS — Y939 Activity, unspecified: Secondary | ICD-10-CM | POA: Insufficient documentation

## 2016-08-03 DIAGNOSIS — Y999 Unspecified external cause status: Secondary | ICD-10-CM | POA: Insufficient documentation

## 2016-08-03 DIAGNOSIS — Y929 Unspecified place or not applicable: Secondary | ICD-10-CM | POA: Diagnosis not present

## 2016-08-03 DIAGNOSIS — Z7951 Long term (current) use of inhaled steroids: Secondary | ICD-10-CM | POA: Insufficient documentation

## 2016-08-03 DIAGNOSIS — M545 Low back pain: Secondary | ICD-10-CM | POA: Diagnosis present

## 2016-08-03 DIAGNOSIS — X58XXXA Exposure to other specified factors, initial encounter: Secondary | ICD-10-CM | POA: Insufficient documentation

## 2016-08-03 DIAGNOSIS — S39012A Strain of muscle, fascia and tendon of lower back, initial encounter: Secondary | ICD-10-CM | POA: Diagnosis not present

## 2016-08-03 DIAGNOSIS — M62838 Other muscle spasm: Secondary | ICD-10-CM | POA: Diagnosis not present

## 2016-08-03 DIAGNOSIS — I10 Essential (primary) hypertension: Secondary | ICD-10-CM | POA: Insufficient documentation

## 2016-08-03 DIAGNOSIS — Z79899 Other long term (current) drug therapy: Secondary | ICD-10-CM | POA: Diagnosis not present

## 2016-08-03 MED ORDER — DIAZEPAM 5 MG PO TABS: 5.0000 mg | ORAL_TABLET | Freq: Three times a day (TID) | ORAL | 0 refills | Status: AC | PRN

## 2016-08-03 MED ORDER — KETOROLAC TROMETHAMINE 30 MG/ML IJ SOLN
30.0000 mg | Freq: Once | INTRAMUSCULAR | Status: AC
  Administered 2016-08-03: 30 mg via INTRAMUSCULAR

## 2016-08-03 MED ORDER — NAPROXEN 500 MG PO TABS: 500.0000 mg | ORAL_TABLET | Freq: Two times a day (BID) | ORAL | 2 refills | Status: DC

## 2016-08-03 NOTE — ED Triage Notes (Signed)
Pt to ed with c/o right knee pain, headache, abd pain and back pain.  Pt states "I am super stressed"

## 2016-08-03 NOTE — ED Notes (Signed)
Pt lying on stretcher watching tv, smiling and pleasant. Skin w/d with good color.  Reports being out of all of her meds for a few months bc she hasnt had the money to get them. C/o lower back pain and right knee pain, denies injury.  Also reports ha and urinary frequency.

## 2016-08-03 NOTE — ED Provider Notes (Signed)
[facility]   ____________________________________________    I have reviewed the triage vital signs and the nursing notes.   HISTORY  Chief Complaint Knee Pain; Abdominal Pain; Migraine; and Back Pain     HPI Elizabeth Coffey is a 55 y.o. female who presents with multiple complaints. Primarily she is concerned about pain in her right lower back. She reports this has been a problem for 3 years but has become acutely worse in the last several days. She reports the pain is sharp and severe at times. She is ambulating well. She also complains of chronic right knee pain. In addition she reports that occasionally she feels that solids sometimes gets stuck in her esophagus although she is tolerating liquids well. No abdominal pain.   Past Medical History:  Diagnosis Date  . Anxiety   . Arthritis   . Hypertension   . Peptic ulcer     There are no active problems to display for this patient.   Past Surgical History:  Procedure Laterality Date  . ABDOMINAL HYSTERECTOMY    . BREAST CYST EXCISION      Prior to Admission medications   Medication Sig Start Date End Date Taking? Authorizing Provider  albuterol-ipratropium (COMBIVENT) 18-103 MCG/ACT inhaler Inhale 1 puff into the lungs every 4 (four) hours as needed for wheezing or shortness of breath. 09/29/15   Charmayne Sheer Beers, PA-C  amLODipine-benazepril (LOTREL) 10-20 MG per capsule Take 1 capsule by mouth daily.    Historical Provider, MD  amLODipine-benazepril (LOTREL) 5-40 MG per capsule Take 1 capsule by mouth daily.    Historical Provider, MD  azithromycin (ZITHROMAX Z-PAK) 250 MG tablet Take 2 tablets (500 mg) on  Day 1,  followed by 1 tablet (250 mg) once daily on Days 2 through 5. 09/29/15   Evangeline Dakin, PA-C  benzonatate (TESSALON PERLES) 100 MG capsule Take 1 capsule (100 mg total) by mouth 3 (three) times daily as needed for cough. 09/07/15 09/06/16  Governor Rooks,  MD  diazepam (VALIUM) 5 MG tablet Take 1 tablet (5 mg total) by mouth every 8 (eight) hours as needed for muscle spasms. 08/03/16 08/08/16  Jene Every, MD  etodolac (LODINE) 500 MG tablet Take 500 mg by mouth 2 (two) times daily.    Historical Provider, MD  FLUoxetine (PROZAC) 20 MG capsule Take 20 mg by mouth daily.    Historical Provider, MD  guaiFENesin-codeine 100-10 MG/5ML syrup Take 10 mLs by mouth every 4 (four) hours as needed for cough. 09/29/15   Charmayne Sheer Beers, PA-C  hydrOXYzine (ATARAX/VISTARIL) 10 MG tablet Take 1 tablet (10 mg total) by mouth 3 (three) times daily as needed. 06/30/15   Arnaldo Natal, MD  hydrOXYzine (ATARAX/VISTARIL) 25 MG tablet Take 25 mg by mouth 3 (three) times daily as needed.    Historical Provider, MD  meloxicam (MOBIC) 15 MG tablet Take 15 mg by mouth daily.    Historical Provider, MD  naproxen (NAPROSYN) 500 MG tablet Take 1 tablet (500 mg total) by mouth 2 (two) times daily with a meal. 08/03/16   Jene Every, MD  omeprazole (PRILOSEC) 40 MG capsule Take 40 mg by mouth daily.    Historical Provider, MD  permethrin (ELIMITE) 5 % cream Apply 1 application topically once. 06/30/15   Arnaldo Natal, MD  pseudoephedrine (SUDAFED) 60 MG tablet Take 1 tablet (60 mg total) by mouth every 4 (four) hours as needed for congestion. 09/29/15  Charmayne Sheerharles M Beers, PA-C  triamterene-hydrochlorothiazide (MAXZIDE-25) 37.5-25 MG per tablet Take 1 tablet by mouth daily.    Historical Provider, MD     Allergies Aspirin; Mobic [meloxicam]; Tramadol; and Chocolate  No family history on file.  Social History Social History  Substance Use Topics  . Smoking status: Never Smoker  . Smokeless tobacco: Never Used  . Alcohol use No    Review of Systems  Constitutional: No fever/chills  ENT: No sore throat.   Gastrointestinal: No abdominal pain.     Musculoskeletal: As above Skin: Negative for rash. Neurological: Negative for headaches , no focal  weakness    ____________________________________________   PHYSICAL EXAM:  VITAL SIGNS: ED Triage Vitals [08/03/16 0844]  Enc Vitals Group     BP      Pulse      Resp      Temp      Temp src      SpO2      Weight      Height      Head Circumference      Peak Flow      Pain Score 10     Pain Loc      Pain Edu?      Excl. in GC?     Constitutional: Alert and oriented. No acute distress. Pleasant and interactive Eyes: Conjunctivae are normal.    Mouth/Throat: Mucous membranes are moist.   Cardiovascular: Normal rate, regular rhythm.  Respiratory: Normal respiratory effort.  No retractions. Genitourinary: deferred Musculoskeletal: Patient with point tenderness right paraspinal region at approximately L5 consistent with muscle spasm. Normal strength in her lower extremities. Normal ambulation Neurologic:  Normal speech and language. No gross focal neurologic deficits are appreciated.   Skin:  Skin is warm, dry and intact. No rash noted.   ____________________________________________   LABS (all labs ordered are listed, but only abnormal results are displayed)  Labs Reviewed - No data to display ____________________________________________  EKG   ____________________________________________  RADIOLOGY  None ____________________________________________   PROCEDURES  Procedure(s) performed: No    Critical Care performed: No ____________________________________________   INITIAL IMPRESSION / ASSESSMENT AND PLAN / ED COURSE  Pertinent labs & imaging results that were available during my care of the patient were reviewed by me and considered in my medical decision making (see chart for details).  Patient with acute on chronic back pain, consistent with muscle spasm. We will treat with Valium and Naprosyn. Toradol IM given in the emergency department. Patient reports a meloxicam allergy but tolerates other person well. As for her difficulty swallowing  solids this is not an emergent issue and we will have her follow with gastroenterology   ____________________________________________   FINAL CLINICAL IMPRESSION(S) / ED DIAGNOSES  Final diagnoses:  Lumbar strain, initial encounter  Muscle spasm      NEW MEDICATIONS STARTED DURING THIS VISIT:  New Prescriptions   DIAZEPAM (VALIUM) 5 MG TABLET    Take 1 tablet (5 mg total) by mouth every 8 (eight) hours as needed for muscle spasms.   NAPROXEN (NAPROSYN) 500 MG TABLET    Take 1 tablet (500 mg total) by mouth 2 (two) times daily with a meal.     Note:  This document was prepared using Dragon voice recognition software and may include unintentional dictation errors.    Jene Everyobert Ryatt Corsino, MD 08/03/16 [phone]

## 2016-08-13 ENCOUNTER — Encounter: Payer: Self-pay | Admitting: Emergency Medicine

## 2016-08-13 ENCOUNTER — Emergency Department: Payer: Medicare HMO

## 2016-08-13 ENCOUNTER — Emergency Department
Admission: EM | Admit: 2016-08-13 | Discharge: 2016-08-13 | Disposition: A | Discharge status: Home / Self Care | Payer: Medicare HMO | Attending: Emergency Medicine | Admitting: Emergency Medicine

## 2016-08-13 DIAGNOSIS — M545 Low back pain, unspecified: Secondary | ICD-10-CM

## 2016-08-13 DIAGNOSIS — G8929 Other chronic pain: Secondary | ICD-10-CM | POA: Insufficient documentation

## 2016-08-13 DIAGNOSIS — W19XXXA Unspecified fall, initial encounter: Secondary | ICD-10-CM

## 2016-08-13 DIAGNOSIS — I10 Essential (primary) hypertension: Secondary | ICD-10-CM | POA: Diagnosis not present

## 2016-08-13 DIAGNOSIS — E876 Hypokalemia: Secondary | ICD-10-CM

## 2016-08-13 DIAGNOSIS — W109XXA Fall (on) (from) unspecified stairs and steps, initial encounter: Secondary | ICD-10-CM | POA: Insufficient documentation

## 2016-08-13 LAB — CBC
HEMATOCRIT: 38.9 % (ref 35.0–47.0)
HEMOGLOBIN: 13.1 g/dL (ref 12.0–16.0)
MCH: 26.3 pg (ref 26.0–34.0)
MCHC: 33.7 g/dL (ref 32.0–36.0)
MCV: 78.2 fL — ABNORMAL LOW (ref 80.0–100.0)
Platelets: 217 10*3/uL (ref 150–440)
RBC: 4.98 MIL/uL (ref 3.80–5.20)
RDW: 14.7 % — ABNORMAL HIGH (ref 11.5–14.5)
WBC: 7 10*3/uL (ref 3.6–11.0)

## 2016-08-13 LAB — BASIC METABOLIC PANEL
ANION GAP: 8 (ref 5–15)
BUN: 13 mg/dL (ref 6–20)
CHLORIDE: 106 mmol/L (ref 101–111)
CO2: 28 mmol/L (ref 22–32)
Calcium: 9.3 mg/dL (ref 8.9–10.3)
Creatinine, Ser: 0.86 mg/dL (ref 0.44–1.00)
GFR calc Af Amer: >60 mL/min (ref >60)
GLUCOSE: 95 mg/dL (ref 65–99)
POTASSIUM: 2.8 mmol/L — AB (ref 3.5–5.1)
SODIUM: 142 mmol/L (ref 135–145)

## 2016-08-13 LAB — TROPONIN I: Troponin I: <0.03 ng/mL (ref <0.03)

## 2016-08-13 MED ORDER — OXYCODONE-ACETAMINOPHEN 5-325 MG PO TABS
ORAL_TABLET | ORAL | Status: AC
  Filled 2016-08-13: qty 1

## 2016-08-13 MED ORDER — POTASSIUM CHLORIDE ER 20 MEQ PO TBCR: 20.0000 meq | EXTENDED_RELEASE_TABLET | Freq: Every day | ORAL | 0 refills | Status: DC

## 2016-08-13 MED ORDER — TRIAMTERENE-HCTZ 37.5-25 MG PO TABS: 1.0000 | ORAL_TABLET | Freq: Every day | ORAL | 0 refills | Status: DC

## 2016-08-13 MED ORDER — OXYCODONE-ACETAMINOPHEN 5-325 MG PO TABS
1.0000 | ORAL_TABLET | Freq: Once | ORAL | Status: AC
  Administered 2016-08-13: 1 via ORAL

## 2016-08-13 MED ORDER — OXYCODONE-ACETAMINOPHEN 5-325 MG PO TABS: 1.0000 | ORAL_TABLET | Freq: Once | ORAL | Status: DC

## 2016-08-13 MED ORDER — POTASSIUM CHLORIDE CRYS ER 20 MEQ PO TBCR
40.0000 meq | EXTENDED_RELEASE_TABLET | Freq: Once | ORAL | Status: AC
  Administered 2016-08-13: 40 meq via ORAL
  Filled 2016-08-13 (×2): qty 2

## 2016-08-13 MED ORDER — AMLODIPINE BESY-BENAZEPRIL HCL 10-20 MG PO CAPS: 1.0000 | ORAL_CAPSULE | Freq: Every day | ORAL | 0 refills | Status: DC

## 2016-08-13 NOTE — ED Triage Notes (Addendum)
Patient reports fell down about 3 concrete steps 4 days ago, pt reports hitting LEFT side of head and face, pt also c/o left back pain and across lower back pain. Pt reports has hx of hypertension reports has not been taking medication for the past 6 months. Pt reports having intermittent neck pain with shooting pain to head for the past month. Pt denies chest pain, shortness of breath, nausea or vomiting.

## 2016-08-13 NOTE — ED Provider Notes (Addendum)
[facility] Emergency Department Provider Note  ____________________________________________   I have reviewed the triage vital signs and the nursing notes.   HISTORY  Chief Complaint Fall    HPI Elizabeth Coffey is a 55 y.o. female with a history of chronic pain who was recently seen for chronic back and knee pain states that she accidentally fell down the stairs 4 days ago did not pass out but has multiple aches and pains as a result including low back pain and rib pain on the left. She denies any nausea or vomiting or diarrhea. Patient presents at the same time as her fianc was here for some other complaint. They are checking in together. Patient states that she is not taking her blood pressure medication. She denies any inspirational chest pain. She states that the pain is when she touches her back or her side. States her whole left side hurts after this alleged fall. She does not complain of any bruising. Patient hasno complaints of numbness or weakness.      Past Medical History:  Diagnosis Date  . Anxiety   . Arthritis   . Hypertension   . Peptic ulcer     There are no active problems to display for this patient.   Past Surgical History:  Procedure Laterality Date  . ABDOMINAL HYSTERECTOMY    . BREAST CYST EXCISION      Prior to Admission medications   Medication Sig Start Date End Date Taking? Authorizing Provider  albuterol-ipratropium (COMBIVENT) 18-103 MCG/ACT inhaler Inhale 1 puff into the lungs every 4 (four) hours as needed for wheezing or shortness of breath. 09/29/15   Charmayne Sheerharles M Beers, PA-C  amLODipine-benazepril (LOTREL) 10-20 MG per capsule Take 1 capsule by mouth daily.    Historical Provider, MD  amLODipine-benazepril (LOTREL) 5-40 MG per capsule Take 1 capsule by mouth daily.    Historical Provider, MD  azithromycin (ZITHROMAX Z-PAK) 250 MG tablet Take 2 tablets (500 mg) on  Day 1,  followed by 1 tablet (250 mg) once  daily on Days 2 through 5. 09/29/15   Evangeline Dakinharles M Beers, PA-C  benzonatate (TESSALON PERLES) 100 MG capsule Take 1 capsule (100 mg total) by mouth 3 (three) times daily as needed for cough. 09/07/15 09/06/16  Governor Rooksebecca Lord, MD  etodolac (LODINE) 500 MG tablet Take 500 mg by mouth 2 (two) times daily.    Historical Provider, MD  FLUoxetine (PROZAC) 20 MG capsule Take 20 mg by mouth daily.    Historical Provider, MD  guaiFENesin-codeine 100-10 MG/5ML syrup Take 10 mLs by mouth every 4 (four) hours as needed for cough. 09/29/15   Charmayne Sheerharles M Beers, PA-C  hydrOXYzine (ATARAX/VISTARIL) 10 MG tablet Take 1 tablet (10 mg total) by mouth 3 (three) times daily as needed. 06/30/15   Arnaldo NatalPaul F Malinda, MD  hydrOXYzine (ATARAX/VISTARIL) 25 MG tablet Take 25 mg by mouth 3 (three) times daily as needed.    Historical Provider, MD  meloxicam (MOBIC) 15 MG tablet Take 15 mg by mouth daily.    Historical Provider, MD  naproxen (NAPROSYN) 500 MG tablet Take 1 tablet (500 mg total) by mouth 2 (two) times daily with a meal. 08/03/16   Jene Everyobert Kinner, MD  omeprazole (PRILOSEC) 40 MG capsule Take 40 mg by mouth daily.    Historical Provider, MD  permethrin (ELIMITE) 5 % cream Apply 1 application topically once. 06/30/15   Arnaldo NatalPaul F Malinda, MD  pseudoephedrine (SUDAFED) 60 MG tablet Take 1 tablet (60 mg  total) by mouth every 4 (four) hours as needed for congestion. 09/29/15   Charmayne Sheer Beers, PA-C  triamterene-hydrochlorothiazide (MAXZIDE-25) 37.5-25 MG per tablet Take 1 tablet by mouth daily.    Historical Provider, MD    Allergies Aspirin; Mobic [meloxicam]; Tramadol; and Chocolate  No family history on file.  Social History Social History  Substance Use Topics  . Smoking status: Never Smoker  . Smokeless tobacco: Never Used  . Alcohol use No    Review of Systems Constitutional: No fever/chills Eyes: No visual changes. ENT: No sore throat. No stiff neck no neck pain Cardiovascular: Denies chest pain. Respiratory: Denies  shortness of breath. Gastrointestinal:   no vomiting.  No diarrhea.  No constipation. Genitourinary: Negative for dysuria. Musculoskeletal: Negative lower extremity swelling Skin: Negative for rash. Neurological: Negative for severe headaches, focal weakness or numbness. 10-point ROS otherwise negative.  ____________________________________________   PHYSICAL EXAM:  VITAL SIGNS: ED Triage Vitals  Enc Vitals Group     BP 08/13/16 2013 (!) 191/100     Pulse Rate 08/13/16 2013 80     Resp 08/13/16 2013 18     Temp 08/13/16 2013 98.2 F (36.8 C)     Temp Source 08/13/16 2013 Oral     SpO2 08/13/16 2013 96 %     Weight 08/13/16 2014 193 lb (87.5 kg)     Height 08/13/16 2014 5\' 4"  (1.626 m)     Head Circumference --      Peak Flow --      Pain Score 08/13/16 2017 9     Pain Loc --      Pain Edu? --      Excl. in GC? --     Constitutional: Alert and oriented. Well appearing and in no acute distress. Eyes: Conjunctivae are normal. PERRL. EOMI. Head: Atraumatic. Nose: No congestion/rhinnorhea. Mouth/Throat: Mucous membranes are moist.  Oropharynx non-erythematous. Neck: No stridor.   Nontender with no meningismus Cardiovascular: Normal rate, regular rhythm. Grossly normal heart sounds.  Good peripheral circulation. Chest: Tender to palpation left chest wall. No bruising noted. Respiratory: Normal respiratory effort.  No retractions. Lungs CTAB. Abdominal: Soft and nontender. No distention. No guarding no Back: Is tenderness palpation of paraspinal muscles of the lower back on the left. There is no mass or swelling.  there is no midline tenderness there are no lesions noted. there is no CVA tenderness. Of note, when I apply light pressure to the top the patient said she states it makes her low back hurt more. Musculoskeletal: No lower extremity tenderness, no upper extremity tenderness. No joint effusions, no DVT signs strong distal pulses no edema Neurologic:  Normal speech and  language. No gross focal neurologic deficits are appreciated.  Skin:  Skin is warm, dry and intact. No rash noted. Psychiatric: Mood and affect are normal. Speech and behavior are normal.  ____________________________________________   LABS (all labs ordered are listed, but only abnormal results are displayed)  Labs Reviewed  BASIC METABOLIC PANEL - Abnormal; Notable for the following:       Result Value   Potassium 2.8 (*)    All other components within normal limits  CBC - Abnormal; Notable for the following:    MCV 78.2 (*)    RDW 14.7 (*)    All other components within normal limits  TROPONIN I   ____________________________________________  EKG  I personally interpreted any EKGs ordered by me or triage Normal sinus rhythm at 73 beats per an acute ST elevation  or acute ST depression ____________________________________________  RADIOLOGY  I reviewed any imaging ordered by me or triage that were performed during my shift and, if possible, patient and/or family made aware of any abnormal findings. ____________________________________________   PROCEDURES  Procedure(s) performed: None  Procedures  Critical Care performed: None  ____________________________________________   INITIAL IMPRESSION / ASSESSMENT AND PLAN / ED COURSE  Pertinent labs & imaging results that were available during my care of the patient were reviewed by me and considered in my medical decision making (see chart for details).  Patient here after an alleged fall, no evidence of significant bruising or abrasion complaining of pain to her low back which appears to be acute on chronic normal neurologic status noted. No evidence of cauda equina syndrome. Because of this alleged fall we will obtain an x-ray. This happened 4 days ago. No indication for CT of the head. Chest x-ray is reassuring. No evidence of pneumothorax. Patient's blood pressure was somewhat elevated however she is not taking her  blood pressure medications. I do not think is reproducible chest wall pain from a fall represents ACS PE dissection myocarditis endocarditis pericarditis pneumothorax or pneumonia. I have given her pain medication here. Patient does have a history of chronic pain. I am reluctant to send her home with narcotics. Very low suspicion for fracture. We do noticed incidentally that her potassium was somewhat low here today. We will begin to repleted. There are no EKG changes. Patient would prefer not to be admitted for this which I don't think is unreasonable as it is only slightly low. We will send her home with potassium supplementation. There is no evidence of acute obvious injury from this alleged 174-day-old fall. Specifically I don't see any bruises or scratches.  Clinical Course   ----------------------------------------- 10:55 PM on 08/13/2016 -----------------------------------------  Patient states that she has been noncompliant with her blood pressure medication for the last 6 months and these are normal blood pressures for her. We will restart her blood pressure medications and we have insisted to her that it is absolutely necessary that she follow closely with her primary care doctor. ____________________________________________   FINAL CLINICAL IMPRESSION(S) / ED DIAGNOSES  Final diagnoses:  None      This chart was dictated using voice recognition software.  Despite best efforts to proofread,  errors can occur which can change meaning.      Jeanmarie PlantJames A Milley Vining, MD 08/13/16 2206    Jeanmarie PlantJames A Mycah Mcdougall, MD 08/13/16 2256

## 2016-08-13 NOTE — ED Notes (Signed)
Pt reports lower back pain and left side pain - pt reports she fell down some concrete steps 4 days ago - pt reports dizziness and losing balance - she states that deep breaths/coughing cause chest pain - she denies LOC - denies nausea/vomiting - pt is A&P x4

## 2016-08-13 NOTE — ED Notes (Signed)
Patient transported to X-ray 

## 2016-08-21 ENCOUNTER — Emergency Department: Payer: Medicare HMO

## 2016-08-21 ENCOUNTER — Emergency Department
Admission: EM | Admit: 2016-08-21 | Discharge: 2016-08-21 | Disposition: A | Discharge status: Home / Self Care | Payer: Medicare HMO | Attending: Emergency Medicine | Admitting: Emergency Medicine

## 2016-08-21 DIAGNOSIS — Z79899 Other long term (current) drug therapy: Secondary | ICD-10-CM | POA: Insufficient documentation

## 2016-08-21 DIAGNOSIS — M549 Dorsalgia, unspecified: Secondary | ICD-10-CM | POA: Insufficient documentation

## 2016-08-21 DIAGNOSIS — Z792 Long term (current) use of antibiotics: Secondary | ICD-10-CM | POA: Diagnosis not present

## 2016-08-21 DIAGNOSIS — Z791 Long term (current) use of non-steroidal anti-inflammatories (NSAID): Secondary | ICD-10-CM | POA: Diagnosis not present

## 2016-08-21 DIAGNOSIS — R0789 Other chest pain: Secondary | ICD-10-CM | POA: Diagnosis present

## 2016-08-21 DIAGNOSIS — I10 Essential (primary) hypertension: Secondary | ICD-10-CM | POA: Diagnosis not present

## 2016-08-21 LAB — CBC
HEMATOCRIT: 41.3 % (ref 35.0–47.0)
Hemoglobin: 13.9 g/dL (ref 12.0–16.0)
MCH: 26.6 pg (ref 26.0–34.0)
MCHC: 33.5 g/dL (ref 32.0–36.0)
MCV: 79.5 fL — AB (ref 80.0–100.0)
Platelets: 247 10*3/uL (ref 150–440)
RBC: 5.2 MIL/uL (ref 3.80–5.20)
RDW: 15.6 % — AB (ref 11.5–14.5)
WBC: 6.2 10*3/uL (ref 3.6–11.0)

## 2016-08-21 LAB — BASIC METABOLIC PANEL
Anion gap: 8 (ref 5–15)
BUN: 20 mg/dL (ref 6–20)
CHLORIDE: 105 mmol/L (ref 101–111)
CO2: 26 mmol/L (ref 22–32)
Calcium: 9.9 mg/dL (ref 8.9–10.3)
Creatinine, Ser: 0.91 mg/dL (ref 0.44–1.00)
GFR calc Af Amer: >60 mL/min (ref >60)
GFR calc non Af Amer: >60 mL/min (ref >60)
Glucose, Bld: 94 mg/dL (ref 65–99)
POTASSIUM: 4 mmol/L (ref 3.5–5.1)
SODIUM: 139 mmol/L (ref 135–145)

## 2016-08-21 LAB — URINALYSIS COMPLETE WITH MICROSCOPIC (ARMC ONLY)
BACTERIA UA: NONE SEEN
Bilirubin Urine: NEGATIVE
GLUCOSE, UA: NEGATIVE mg/dL
HGB URINE DIPSTICK: NEGATIVE
Ketones, ur: NEGATIVE mg/dL
LEUKOCYTES UA: NEGATIVE
NITRITE: NEGATIVE
PH: 8 (ref 5.0–8.0)
Protein, ur: NEGATIVE mg/dL
Specific Gravity, Urine: 1.012 (ref 1.005–1.030)

## 2016-08-21 LAB — TROPONIN I: Troponin I: <0.03 ng/mL (ref <0.03)

## 2016-08-21 LAB — POCT PREGNANCY, URINE: PREG TEST UR: NEGATIVE

## 2016-08-21 MED ORDER — LIDOCAINE 5 % EX PTCH: 1.0000 | MEDICATED_PATCH | Freq: Two times a day (BID) | CUTANEOUS | 0 refills | Status: AC

## 2016-08-21 NOTE — ED Provider Notes (Signed)
[facility] Emergency Department Provider Note   ____________________________________________   I have reviewed the triage vital signs and the nursing notes.   HISTORY  Chief Complaint Back Pain and Chest Pain   History limited by: Not Limited   HPI Elizabeth BaldyMary A Coffey is a 55 y.o. female who presents to the emergency department today with continued chest wall pain. The patient has been seen in the emergency department a couple times recently for this pain. It all occurred after she fell down some steps. She continues to complain of pain in her front chest as well as in her left upper back. Patient says that the pain becomes severe. It is sharp. It is worse with deep breaths. She denies any fevers.   Past Medical History:  Diagnosis Date  . Anxiety   . Arthritis   . Hypertension   . Peptic ulcer     There are no active problems to display for this patient.   Past Surgical History:  Procedure Laterality Date  . ABDOMINAL HYSTERECTOMY    . BREAST CYST EXCISION      Prior to Admission medications   Medication Sig Start Date End Date Taking? Authorizing Provider  albuterol-ipratropium (COMBIVENT) 18-103 MCG/ACT inhaler Inhale 1 puff into the lungs every 4 (four) hours as needed for wheezing or shortness of breath. 09/29/15   Charmayne Sheerharles M Beers, PA-C  amLODipine-benazepril (LOTREL) 10-20 MG capsule Take 1 capsule by mouth daily. 08/13/16   Jeanmarie PlantJames A McShane, MD  azithromycin (ZITHROMAX Z-PAK) 250 MG tablet Take 2 tablets (500 mg) on  Day 1,  followed by 1 tablet (250 mg) once daily on Days 2 through 5. 09/29/15   Evangeline Dakinharles M Beers, PA-C  benzonatate (TESSALON PERLES) 100 MG capsule Take 1 capsule (100 mg total) by mouth 3 (three) times daily as needed for cough. 09/07/15 09/06/16  Governor Rooksebecca Lord, MD  etodolac (LODINE) 500 MG tablet Take 500 mg by mouth 2 (two) times daily.    Historical Provider, MD  FLUoxetine (PROZAC) 20 MG capsule Take 20 mg by mouth daily.    Historical  Provider, MD  guaiFENesin-codeine 100-10 MG/5ML syrup Take 10 mLs by mouth every 4 (four) hours as needed for cough. 09/29/15   Charmayne Sheerharles M Beers, PA-C  hydrOXYzine (ATARAX/VISTARIL) 10 MG tablet Take 1 tablet (10 mg total) by mouth 3 (three) times daily as needed. 06/30/15   Arnaldo NatalPaul F Malinda, MD  hydrOXYzine (ATARAX/VISTARIL) 25 MG tablet Take 25 mg by mouth 3 (three) times daily as needed.    Historical Provider, MD  meloxicam (MOBIC) 15 MG tablet Take 15 mg by mouth daily.    Historical Provider, MD  naproxen (NAPROSYN) 500 MG tablet Take 1 tablet (500 mg total) by mouth 2 (two) times daily with a meal. 08/03/16   Jene Everyobert Kinner, MD  omeprazole (PRILOSEC) 40 MG capsule Take 40 mg by mouth daily.    Historical Provider, MD  permethrin (ELIMITE) 5 % cream Apply 1 application topically once. 06/30/15   Arnaldo NatalPaul F Malinda, MD  potassium chloride 20 MEQ TBCR Take 20 mEq by mouth daily. 08/13/16   Jeanmarie PlantJames A McShane, MD  pseudoephedrine (SUDAFED) 60 MG tablet Take 1 tablet (60 mg total) by mouth every 4 (four) hours as needed for congestion. 09/29/15   Charmayne Sheerharles M Beers, PA-C  triamterene-hydrochlorothiazide (MAXZIDE-25) 37.5-25 MG tablet Take 1 tablet by mouth daily. 08/13/16   Jeanmarie PlantJames A McShane, MD    Allergies Aspirin; Mobic [meloxicam]; Tramadol; and Chocolate  No family history on  file.  Social History Social History  Substance Use Topics  . Smoking status: Never Smoker  . Smokeless tobacco: Never Used  . Alcohol use No    Review of Systems  Constitutional: Negative for fever. Cardiovascular: Positive for chest wall pain. Respiratory: Negative for shortness of breath. Gastrointestinal: Negative for abdominal pain, vomiting and diarrhea. Neurological: Negative for headaches, focal weakness or numbness.   10-point ROS otherwise negative.  ____________________________________________   PHYSICAL EXAM:  VITAL SIGNS: ED Triage Vitals  Enc Vitals Group     BP 08/21/16 1145 (!) 157/90     Pulse Rate  08/21/16 1145 82     Resp 08/21/16 1145 17     Temp 08/21/16 1145 98 F (36.7 C)     Temp Source 08/21/16 1145 Oral     SpO2 08/21/16 1145 98 %     Weight --      Height --      Head Circumference --      Peak Flow --      Pain Score 08/21/16 1230 7   Constitutional: Alert and oriented. Well appearing and in no distress. Eyes: Conjunctivae are normal. PERRL. Normal extraocular movements. ENT   Head: Normocephalic and atraumatic.   Nose: No congestion/rhinnorhea.   Mouth/Throat: Mucous membranes are moist.   Neck: No stridor. Hematological/Lymphatic/Immunilogical: No cervical lymphadenopathy. Cardiovascular: Normal rate, regular rhythm.  No murmurs, rubs, or gallops. Respiratory: Normal respiratory effort without tachypnea nor retractions. Breath sounds are clear and equal bilaterally. No wheezes/rales/rhonchi. Gastrointestinal: Soft and nontender. No distention.  Genitourinary: Deferred Musculoskeletal: Normal range of motion in all extremities. No joint effusions.  No lower extremity tenderness nor edema. Positive for tenderness over the left upper back. Neurologic:  Normal speech and language. No gross focal neurologic deficits are appreciated.  Skin:  Skin is warm, dry and intact. No rash noted. Psychiatric: Mood and affect are normal. Speech and behavior are normal. Patient exhibits appropriate insight and judgment.  ____________________________________________    LABS (pertinent positives/negatives)  Labs Reviewed  CBC - Abnormal; Notable for the following:       Result Value   MCV 79.5 (*)    RDW 15.6 (*)    All other components within normal limits  URINALYSIS COMPLETEWITH MICROSCOPIC ([facility] ONLY) - Abnormal; Notable for the following:    Color, Urine YELLOW (*)    APPearance HAZY (*)    Squamous Epithelial / LPF 6-30 (*)    All other components within normal limits  BASIC METABOLIC PANEL  TROPONIN I  POCT PREGNANCY, URINE      ____________________________________________   EKG  I, Phineas Semen, attending physician, personally viewed and interpreted this EKG  EKG Time: 1148 Rate: 72 Rhythm: normal sinus rhythm Axis: left axis deviation Intervals: qtc 451 QRS: narrow, q wave V2 ST changes: no st elevation Impression: abnormal ekg   ____________________________________________    RADIOLOGY  CXR IMPRESSION:  No active cardiopulmonary disease.      ____________________________________________   PROCEDURES  Procedures  ____________________________________________   INITIAL IMPRESSION / ASSESSMENT AND PLAN / ED COURSE  Pertinent labs & imaging results that were available during my care of the patient were reviewed by me and considered in my medical decision making (see chart for details).  Patient presents to the emergency department today because of concern for continued chest wall pain. CXR without concerning findings. Will try lidocaine patch. ____________________________________________   FINAL CLINICAL IMPRESSION(S) / ED DIAGNOSES  Final diagnoses:  Chest wall pain  Note: This dictation was prepared with Dragon dictation. Any transcriptional errors that result from this process are unintentional    Phineas Semen, MD 08/21/16 1512

## 2016-08-21 NOTE — Discharge Instructions (Signed)
Please seek medical attention for any high fevers, chest pain, shortness of breath, change in behavior, persistent vomiting, bloody stool or any other new or concerning symptoms.  

## 2016-08-21 NOTE — ED Triage Notes (Signed)
Patient presents to the ED with rib pain/back pain/chest pain and some shortness of breath for over one week.  Patient was seen in the ED about 1 week ago and was diagnosed with broken ribs.  Patient reports since that time she has had a lot of pain and increasing shortness of breath.  Patient speaking in full sentences without difficulty at this time.  Ambulatory to triage without difficulty.

## 2016-08-21 NOTE — ED Notes (Signed)
Pt alert and oriented X4, active, cooperative, pt in NAD. RR even and unlabored, color WNL.  Pt informed to return if any life threatening symptoms occur.   

## 2016-08-21 NOTE — ED Notes (Signed)
Pt instructed on use of incentive spirometer and sent home with it. Marisue IvanLiz, RN instructed patient and patient demonstrated correct use back. Pt alert and oriented X4, active, cooperative, pt in NAD. RR even and unlabored, color WNL.  Pt informed to return if any life threatening symptoms occur.

## 2016-08-21 NOTE — ED Notes (Signed)
Helped pt to bathroom.Gave graham crackers and peanut butter.

## 2016-08-21 NOTE — ED Notes (Signed)
MD at bedside. 

## 2016-09-23 DIAGNOSIS — S161XXA Strain of muscle, fascia and tendon at neck level, initial encounter: Secondary | ICD-10-CM | POA: Insufficient documentation

## 2016-09-23 DIAGNOSIS — M5136 Other intervertebral disc degeneration, lumbar region: Secondary | ICD-10-CM | POA: Insufficient documentation

## 2016-09-23 DIAGNOSIS — I10 Essential (primary) hypertension: Secondary | ICD-10-CM | POA: Insufficient documentation

## 2016-09-23 DIAGNOSIS — K219 Gastro-esophageal reflux disease without esophagitis: Secondary | ICD-10-CM | POA: Insufficient documentation

## 2016-10-20 ENCOUNTER — Emergency Department: Payer: Medicare Other

## 2016-10-20 ENCOUNTER — Encounter: Payer: Self-pay | Admitting: Emergency Medicine

## 2016-10-20 ENCOUNTER — Emergency Department
Admission: EM | Admit: 2016-10-20 | Discharge: 2016-10-20 | Disposition: A | Discharge status: Home / Self Care | Payer: Medicare Other | Attending: Emergency Medicine | Admitting: Emergency Medicine

## 2016-10-20 DIAGNOSIS — R51 Headache: Secondary | ICD-10-CM | POA: Diagnosis present

## 2016-10-20 DIAGNOSIS — Z79899 Other long term (current) drug therapy: Secondary | ICD-10-CM | POA: Insufficient documentation

## 2016-10-20 DIAGNOSIS — Z792 Long term (current) use of antibiotics: Secondary | ICD-10-CM | POA: Insufficient documentation

## 2016-10-20 DIAGNOSIS — Z791 Long term (current) use of non-steroidal anti-inflammatories (NSAID): Secondary | ICD-10-CM | POA: Insufficient documentation

## 2016-10-20 DIAGNOSIS — R0602 Shortness of breath: Secondary | ICD-10-CM | POA: Insufficient documentation

## 2016-10-20 DIAGNOSIS — F410 Panic disorder [episodic paroxysmal anxiety] without agoraphobia: Secondary | ICD-10-CM

## 2016-10-20 DIAGNOSIS — I10 Essential (primary) hypertension: Secondary | ICD-10-CM | POA: Insufficient documentation

## 2016-10-20 DIAGNOSIS — J069 Acute upper respiratory infection, unspecified: Secondary | ICD-10-CM | POA: Diagnosis not present

## 2016-10-20 DIAGNOSIS — R809 Proteinuria, unspecified: Secondary | ICD-10-CM | POA: Diagnosis not present

## 2016-10-20 LAB — SEDIMENTATION RATE: Sed Rate: 41 mm/hr — ABNORMAL HIGH (ref 0–30)

## 2016-10-20 LAB — URINALYSIS COMPLETE WITH MICROSCOPIC (ARMC ONLY)
BILIRUBIN URINE: NEGATIVE
Bacteria, UA: NONE SEEN
GLUCOSE, UA: NEGATIVE mg/dL
HGB URINE DIPSTICK: NEGATIVE
Ketones, ur: NEGATIVE mg/dL
Leukocytes, UA: NEGATIVE
Nitrite: NEGATIVE
Protein, ur: 30 mg/dL — AB
Specific Gravity, Urine: 1.018 (ref 1.005–1.030)
pH: 6 (ref 5.0–8.0)

## 2016-10-20 LAB — BASIC METABOLIC PANEL
Anion gap: 8 (ref 5–15)
BUN: 16 mg/dL (ref 6–20)
CALCIUM: 9.8 mg/dL (ref 8.9–10.3)
CO2: 25 mmol/L (ref 22–32)
CREATININE: 0.76 mg/dL (ref 0.44–1.00)
Chloride: 108 mmol/L (ref 101–111)
GFR calc non Af Amer: >60 mL/min (ref >60)
Glucose, Bld: 104 mg/dL — ABNORMAL HIGH (ref 65–99)
Potassium: 3.4 mmol/L — ABNORMAL LOW (ref 3.5–5.1)
SODIUM: 141 mmol/L (ref 135–145)

## 2016-10-20 LAB — CBC
HCT: 39 % (ref 35.0–47.0)
Hemoglobin: 13.3 g/dL (ref 12.0–16.0)
MCH: 26.8 pg (ref 26.0–34.0)
MCHC: 34.2 g/dL (ref 32.0–36.0)
MCV: 78.4 fL — ABNORMAL LOW (ref 80.0–100.0)
PLATELETS: 265 10*3/uL (ref 150–440)
RBC: 4.98 MIL/uL (ref 3.80–5.20)
RDW: 15.4 % — ABNORMAL HIGH (ref 11.5–14.5)
WBC: 6.2 10*3/uL (ref 3.6–11.0)

## 2016-10-20 LAB — TROPONIN I

## 2016-10-20 LAB — INFLUENZA PANEL BY PCR (TYPE A & B)
H1N1FLUPCR: NOT DETECTED
INFLAPCR: NEGATIVE
INFLBPCR: NEGATIVE

## 2016-10-20 MED ORDER — ACETAMINOPHEN 500 MG PO TABS
1000.0000 mg | ORAL_TABLET | Freq: Once | ORAL | Status: AC
  Administered 2016-10-20: 1000 mg via ORAL

## 2016-10-20 MED ORDER — AMLODIPINE BESYLATE 5 MG PO TABS
10.0000 mg | ORAL_TABLET | Freq: Once | ORAL | Status: AC
  Administered 2016-10-20: 10 mg via ORAL

## 2016-10-20 MED ORDER — ACETAMINOPHEN 500 MG PO TABS
ORAL_TABLET | ORAL | Status: AC
Start: 2016-10-20 — End: 2016-10-20
  Administered 2016-10-20: 1000 mg via ORAL
  Filled 2016-10-20: qty 2

## 2016-10-20 MED ORDER — AMLODIPINE BESYLATE 10 MG PO TABS: 10.0000 mg | ORAL_TABLET | Freq: Every day | ORAL | 0 refills | Status: DC

## 2016-10-20 MED ORDER — AMLODIPINE BESYLATE 5 MG PO TABS
ORAL_TABLET | ORAL | Status: AC
  Administered 2016-10-20: 10 mg via ORAL
  Filled 2016-10-20: qty 2

## 2016-10-20 NOTE — Discharge Instructions (Signed)
Please increase your Norvasc to 10mg  once daily.  Please take your blood pressure during a calm part of your day every day and record it. Bring the record with you to your primary care physician's office.  Return to the emergency department if you develops severe pain, vomiting, visual changes, numbness tingling or weakness,  chest pain or shortness of breath, or any other symptoms concerning to you.

## 2016-10-20 NOTE — ED Triage Notes (Signed)
Pt presents with blurred vision, dizziness and headache for two weeks.

## 2016-10-20 NOTE — ED Provider Notes (Addendum)
[facility] [place] Emergency Department Provider Note  ____________________________________________  Time seen: Approximately 4:46 PM  I have reviewed the triage vital signs and the nursing notes.   HISTORY  Chief Complaint Headache; Blurred Vision; and Weakness    HPI Elizabeth Coffey is a 55 y.o. female w/ a history of hypertension and recently decreased antihypertensives, anxiety, presenting with headache, pain with eye movement, blurred vision, and panic attacks. The patient reports that she has had a recent change in primary care physicians, and that her blood pressure medications have recently been decreased. Over the past several weeks, the patient has had intermittent almost daily mild headaches, pain with eye movement, blurred vision. She reports tingling on the entire face across the chest and bilateral hands regularly. Occasionally she reports typical panic attacks described as sharp chest pains with shortness of breath, which are similar in severity and character to previous panic attacks. She does not have any other new or changed chest pain. The patient also reports a nonproductive cough with congestion and rhinorrhea but no fever, sore throat, ear pain. No lightheadedness or fainting, no fever or cough symptoms, no difficulty walking, no changes in speech or mental status.   Past Medical History:  Diagnosis Date  . Anxiety   . Arthritis   . Hypertension   . Peptic ulcer     There are no active problems to display for this patient.   Past Surgical History:  Procedure Laterality Date  . ABDOMINAL HYSTERECTOMY    . BREAST CYST EXCISION      Current Outpatient Rx  . Order #: [id number] Class: Print  . Order #: [id number] Class: Print  . Order #: [id number] Class: Print  . Order #: [id number] Class: Print  . Order #: [id number] Class: Historical Med  . Order #: [id number] Class: Historical Med  . Order #: [id number] Class: Print  . Order #: [id number] Class: Print  .  Order #: [id number] Class: Historical Med  . Order #: [id number] Class: Print  . Order #: [id number] Class: Historical Med  . Order #: [id number] Class: Print  . Order #: [id number] Class: Historical Med  . Order #: [id number] Class: Print  . Order #: [id number] Class: Print  . Order #: [id number] Class: Print  . Order #: [id number] Class: Print    Allergies Aspirin; Mobic [meloxicam]; Tramadol; and Chocolate  No family history on file.  Social History Social History  Substance Use Topics  . Smoking status: Never Smoker  . Smokeless tobacco: Never Used  . Alcohol use No    Review of Systems Constitutional: No fever/chills.  No lightheadedness or fainting.  Positive myalgias. Eyes: + intermittent blurred vision.  No double vision. ENT: No sore throat. Positive congestion and rhinorrhea. Cardiovascular: positive chest pain. Denies palpitations. Respiratory: positive shortness of breath.  Positive cough. Gastrointestinal: No abdominal pain.  No nausea, no vomiting.  No diarrhea.  No constipation. Genitourinary: Negative for dysuria. Musculoskeletal: Negative for back pain. Skin: Negative for rash. Neurological: Negative for headaches. No focal numbness, tingling or weakness.  Psychiatric:+ typical panic attacks  10-point ROS otherwise negative.  ____________________________________________   PHYSICAL EXAM:  VITAL SIGNS: ED Triage Vitals  Enc Vitals Group     BP 10/20/16 1527 (!) 164/111     Pulse Rate 10/20/16 1527 70     Resp 10/20/16 1527 20     Temp 10/20/16 1527 97.9 F (36.6 C)     Temp Source 10/20/16 1527 Oral     SpO2 10/20/16 1527 98 %     Weight 10/20/16 1528 186 lb (84.4 kg)  Height 10/20/16 1528 5' 4" (1.626 m)     Head Circumference --      Peak Flow --      Pain Score 10/20/16 1528 9     Pain Loc --      Pain Edu? --      Excl. in [place]? --     Constitutional: Alert and oriented. Well appearing and in no acute distress. Answers questions appropriately. Eyes:  Conjunctivae are normal.  EOMI. No scleral icterus.PERRLA. Reports pain with lateral eye movements bilaterally. No associated vertical or horizontal nystagmus. No eye discharge. Head: Atraumatic. Tender to palpation over the bilateral temples without swelling. Nose: No congestion/rhinnorhea. Mouth/Throat: Mucous membranes are moist. No posterior pharyngeal erythema. No tonsillar swelling or exudate. Posterior palate is symmetric and uvula is midline. Neck: No stridor.  Supple.  No JVD. No meningismus. Cardiovascular: Normal rate, regular rhythm. No murmurs, rubs or gallops.  Respiratory: Normal respiratory effort.  No accessory muscle use or retractions. Lungs CTAB.  No wheezes, rales or ronchi. Gastrointestinal: Soft, nontender and nondistended.  No guarding or rebound.  No peritoneal signs. Musculoskeletal: No LE edema. No ttp in the calves or palpable cords.  Negative Homan's sign. Neurologic: Alert and oriented 3. Speech is clear.  Face and smile symmetric. Tongue is midline. EOMI and PERRLA.  No nystagmus.  No pronator drift. 5 out of 5 grip, biceps, triceps, hip flexors, plantar flexion and dorsiflexion. Normal sensation to light touch in the bilateral upper and lower extremities. Decreased sensation to light touch throughout the entirety of the face, including the forehead. Normal gait without ataxia. Skin:  Skin is warm, dry and intact. No rash noted. Psychiatric: Mood and affect are normal. Speech and behavior are normal.  Normal judgement.  ____________________________________________   LABS (all labs ordered are listed, but only abnormal results are displayed)  Labs Reviewed  BASIC METABOLIC PANEL - Abnormal; Notable for the following:       Result Value   Potassium 3.4 (*)    Glucose, Bld 104 (*)    All other components within normal limits  CBC - Abnormal; Notable for the following:    MCV 78.4 (*)    RDW 15.4 (*)    All other components within normal limits  URINALYSIS  COMPLETEWITH MICROSCOPIC ([facility] ONLY) - Abnormal; Notable for the following:    Color, Urine YELLOW (*)    APPearance CLEAR (*)    Protein, ur 30 (*)    Squamous Epithelial / LPF 0-5 (*)    All other components within normal limits  SEDIMENTATION RATE - Abnormal; Notable for the following:    Sed Rate 41 (*)    All other components within normal limits  INFLUENZA PANEL BY PCR (TYPE A & B, H1N1)  TROPONIN I  CBG MONITORING, ED   ____________________________________________  EKG  ED ECG REPORT I, Eula Listen, the attending physician, personally viewed and interpreted this ECG.   Date: 10/20/2016  EKG Time: 1524  Rate: 75  Rhythm: normal sinus rhythm  Axis: normal  Intervals:none  ST&T Change: No ST changes.  ____________________________________________  [id number]  Dg Chest 2 View  Result Date: 10/20/2016 CLINICAL DATA:  Blurred vision and dizziness EXAM: CHEST  2 VIEW COMPARISON:  08/21/2016 FINDINGS: Normal heart size. Clear lungs. Mild scoliosis of the mid thoracic spine. No pneumothorax or pleural effusion. IMPRESSION: No active cardiopulmonary disease. Electronically Signed   By: Marybelle Killings M.D.   On: 10/20/2016 17:11   Ct Head Wo Contrast  Result Date: 10/20/2016 CLINICAL DATA:  55 year old female presenting with blurry vision, dizziness and headaches for the past 2 weeks. EXAM: CT HEAD WITHOUT CONTRAST TECHNIQUE: Contiguous axial images were obtained from the base of the skull through the vertex without intravenous contrast. COMPARISON:  Head CT 05/17/2005. FINDINGS: Brain: No evidence of acute infarction, hemorrhage, hydrocephalus, extra-axial collection or mass lesion/mass effect. Patchy and confluent areas of decreased attenuation are noted throughout the deep and periventricular white matter of the cerebral hemispheres bilaterally, compatible with chronic microvascular ischemic disease. Vascular: No hyperdense vessel or unexpected calcification. Skull:  Normal. Negative for fracture or focal lesion. Sinuses/Orbits: No acute finding. Other: None. IMPRESSION: 1. No acute intracranial abnormalities. 2. Chronic microvascular ischemic changes in the cerebral white matter, as above. Electronically Signed   By: Vinnie Langton M.D.   On: 10/20/2016 17:10    ____________________________________________   PROCEDURES  Procedure(s) performed: None  Procedures  Critical Care performed: No ____________________________________________   INITIAL IMPRESSION / ASSESSMENT AND PLAN / ED COURSE  Pertinent labs & imaging results that were available during my care of the patient were reviewed by me and considered in my medical decision making (see chart for details).  55 y.o. female with a history of hypertension and recently decreased antihypertensive medications presenting with high blood pressure, intermittent headaches and blurred vision, pain with eye movement, as well as anxiety and cough and cold symptoms. To address the patient's hypertension, it is likely that her blood pressure is essential hypertension which is under treated by outpatient medications. I do not see any evidence of focal neurologic deficits that would be suggestive of CVA, but we'll get a CT scan for further evaluation. The patient does have pain over both temples, so I'll send a sedimentation rate to evaluate for temporal arteritis; this would be less likely given the bilateral nature of her symptoms.   The patient does have chest pain and shortness of breath which is typical of her previous anxiety, and an EKG which is reassuring without ischemic changes. We'll get a troponin, but overall, the nature of the symptoms are mostly related to her anxiety.  The patient also has cough and cold symptoms, which are likely due to a viral URI. We'll get a flu swab.  ----------------------------------------- 4:58 PM on 10/20/2016 -----------------------------------------  Patient has a  sedimentation rate of 41. This is not typical for temporal arteritis, with expected ESR closer to 100.  Also, the patient has bilateral pain and intermittent visual changes. Steroid treatment is not indicated at this time, although the patient will f/u with her PMD and if her symptoms do not resolve with improved hypertension, this should be revisited.  At this time, the patient reports completely normal vision.  ----------------------------------------- 6:19 PM on 10/20/2016 -----------------------------------------  The patient's workup in the emergency department is reassuring. Her CT scan is negative for any intracranial process. She does have hypertension with proteinuria, which is suggestive of undertreated hypertension. I will increase her Norvasc to 10 mg daily, and have her follow-up with her primary care physician. For her URI symptoms, her influenza test is negative and she does not have any evidence of pneumonia on her chest x-ray. She will be treated symptomatically. Lastly, the patient's chest pain is most likely due to her anxiety, although I have encouraged her to talk to her primary care physician further about this and to discuss cardiac stress test.  At this time, the patient is stable for discharge.  Return precautions as well as follow up  instructions were discussed. ____________________________________________  FINAL CLINICAL IMPRESSION(S) / ED DIAGNOSES  Final diagnoses:  Proteinuria, unspecified type  Essential hypertension  Upper respiratory tract infection, unspecified type  Panic attacks    Clinical Course      NEW MEDICATIONS STARTED DURING THIS VISIT:  New Prescriptions   AMLODIPINE (NORVASC) 10 MG TABLET    Take 1 tablet (10 mg total) by mouth daily.      Eula Listen, MD 10/20/16 1820    Eula Listen, MD 10/20/16 1836

## 2017-03-03 ENCOUNTER — Other Ambulatory Visit: Payer: Self-pay | Admitting: Internal Medicine

## 2017-03-03 DIAGNOSIS — R1011 Right upper quadrant pain: Secondary | ICD-10-CM

## 2017-03-10 ENCOUNTER — Encounter: Payer: Self-pay | Admitting: Emergency Medicine

## 2017-03-10 ENCOUNTER — Ambulatory Visit
Admission: RE | Admit: 2017-03-10 | Discharge: 2017-03-10 | Disposition: A | Discharge status: Home / Self Care | Payer: Medicare Other | Source: Ambulatory Visit | Attending: Internal Medicine | Admitting: Internal Medicine

## 2017-03-10 ENCOUNTER — Emergency Department
Admission: EM | Admit: 2017-03-10 | Discharge: 2017-03-10 | Disposition: A | Discharge status: Home / Self Care | Payer: Medicare Other | Attending: Emergency Medicine | Admitting: Emergency Medicine

## 2017-03-10 DIAGNOSIS — Z79899 Other long term (current) drug therapy: Secondary | ICD-10-CM | POA: Insufficient documentation

## 2017-03-10 DIAGNOSIS — N760 Acute vaginitis: Secondary | ICD-10-CM | POA: Insufficient documentation

## 2017-03-10 DIAGNOSIS — N898 Other specified noninflammatory disorders of vagina: Secondary | ICD-10-CM | POA: Diagnosis present

## 2017-03-10 DIAGNOSIS — R1011 Right upper quadrant pain: Secondary | ICD-10-CM | POA: Insufficient documentation

## 2017-03-10 DIAGNOSIS — K760 Fatty (change of) liver, not elsewhere classified: Secondary | ICD-10-CM

## 2017-03-10 DIAGNOSIS — I10 Essential (primary) hypertension: Secondary | ICD-10-CM | POA: Diagnosis not present

## 2017-03-10 DIAGNOSIS — B9689 Other specified bacterial agents as the cause of diseases classified elsewhere: Secondary | ICD-10-CM

## 2017-03-10 LAB — URINALYSIS, COMPLETE (UACMP) WITH MICROSCOPIC
Bacteria, UA: NONE SEEN
Bilirubin Urine: NEGATIVE
GLUCOSE, UA: NEGATIVE mg/dL
Ketones, ur: NEGATIVE mg/dL
Leukocytes, UA: NEGATIVE
NITRITE: NEGATIVE
PH: 7 (ref 5.0–8.0)
Protein, ur: NEGATIVE mg/dL
Specific Gravity, Urine: 1.017 (ref 1.005–1.030)

## 2017-03-10 LAB — CHLAMYDIA/NGC RT PCR (ARMC ONLY)
Chlamydia Tr: NOT DETECTED
N gonorrhoeae: NOT DETECTED

## 2017-03-10 LAB — WET PREP, GENITAL
SPERM: NONE SEEN
Trich, Wet Prep: NONE SEEN
WBC WET PREP: NONE SEEN
Yeast Wet Prep HPF POC: NONE SEEN

## 2017-03-10 MED ORDER — METRONIDAZOLE 500 MG PO TABS: 500.0000 mg | ORAL_TABLET | Freq: Two times a day (BID) | ORAL | 0 refills | Status: AC

## 2017-03-10 NOTE — ED Notes (Signed)
Pt reports clear vaginal discharge since October with some urinary sx's.

## 2017-03-10 NOTE — ED Provider Notes (Signed)
[facility] Emergency Department Provider Note   ____________________________________________   First MD Initiated Contact with Patient 03/10/17 [phone]     (approximate)  I have reviewed the triage vital signs and the nursing notes.   HISTORY  Chief Complaint Vaginal Discharge    HPI Elizabeth BaldyMary A Slotnick is a 56 y.o. female patient complain of a clear vaginal discharge and vaginal pain for 5 months. Patient state last intercourse was 5 months ago. Sepsis occurred after sexual intercourse. Patient states she has tried numerous over-the-counter medications without relief. Patient denies any flank pain with this complaint. Patient denies any fever associated this complaint. Patient prescription for pain as a vaginal burning sensation. Patient rates the pain as a 3/10. Patient has not discussed this complaint with her family doctor.   Past Medical History:  Diagnosis Date  . Anxiety   . Arthritis   . Hypertension   . Peptic ulcer     There are no active problems to display for this patient.   Past Surgical History:  Procedure Laterality Date  . ABDOMINAL HYSTERECTOMY    . BREAST CYST EXCISION      Prior to Admission medications   Medication Sig Start Date End Date Taking? Authorizing Provider  albuterol-ipratropium (COMBIVENT) 18-103 MCG/ACT inhaler Inhale 1 puff into the lungs every 4 (four) hours as needed for wheezing or shortness of breath. 09/29/15   Charmayne Sheerharles M Beers, PA-C  amLODipine (NORVASC) 10 MG tablet Take 1 tablet (10 mg total) by mouth daily. 10/20/16 10/20/17  Anne-Caroline Sharma CovertNorman, MD  amLODipine-benazepril (LOTREL) 10-20 MG capsule Take 1 capsule by mouth daily. 08/13/16   Jeanmarie PlantJames A McShane, MD  azithromycin (ZITHROMAX Z-PAK) 250 MG tablet Take 2 tablets (500 mg) on  Day 1,  followed by 1 tablet (250 mg) once daily on Days 2 through 5. 09/29/15   Evangeline Dakinharles M Beers, PA-C  etodolac (LODINE) 500 MG tablet Take 500 mg by mouth 2 (two) times daily.     Historical Provider, MD  FLUoxetine (PROZAC) 20 MG capsule Take 20 mg by mouth daily.    Historical Provider, MD  guaiFENesin-codeine 100-10 MG/5ML syrup Take 10 mLs by mouth every 4 (four) hours as needed for cough. 09/29/15   Charmayne Sheerharles M Beers, PA-C  hydrOXYzine (ATARAX/VISTARIL) 10 MG tablet Take 1 tablet (10 mg total) by mouth 3 (three) times daily as needed. 06/30/15   Arnaldo NatalPaul F Malinda, MD  hydrOXYzine (ATARAX/VISTARIL) 25 MG tablet Take 25 mg by mouth 3 (three) times daily as needed.    Historical Provider, MD  lidocaine (LIDODERM) 5 % Place 1 patch onto the skin every 12 (twelve) hours. Remove & Discard patch within 12 hours or as directed by MD 08/21/16 08/21/17  Phineas SemenGraydon Goodman, MD  meloxicam (MOBIC) 15 MG tablet Take 15 mg by mouth daily.    Historical Provider, MD  metroNIDAZOLE (FLAGYL) 500 MG tablet Take 1 tablet (500 mg total) by mouth 2 (two) times daily. 03/10/17 03/17/17  Joni Reiningonald K Smith, PA-C  naproxen (NAPROSYN) 500 MG tablet Take 1 tablet (500 mg total) by mouth 2 (two) times daily with a meal. 08/03/16   Jene Everyobert Kinner, MD  omeprazole (PRILOSEC) 40 MG capsule Take 40 mg by mouth daily.    Historical Provider, MD  permethrin (ELIMITE) 5 % cream Apply 1 application topically once. 06/30/15   Arnaldo NatalPaul F Malinda, MD  potassium chloride 20 MEQ TBCR Take 20 mEq by mouth daily. 08/13/16   Jeanmarie PlantJames A McShane, MD  pseudoephedrine (SUDAFED) 60 MG  tablet Take 1 tablet (60 mg total) by mouth every 4 (four) hours as needed for congestion. 09/29/15   Charmayne Sheer Beers, PA-C  triamterene-hydrochlorothiazide (MAXZIDE-25) 37.5-25 MG tablet Take 1 tablet by mouth daily. 08/13/16   Jeanmarie Plant, MD    Allergies Aspirin; Mobic [meloxicam]; Tramadol; and Chocolate  No family history on file.  Social History Social History  Substance Use Topics  . Smoking status: Never Smoker  . Smokeless tobacco: Never Used  . Alcohol use No    Review of Systems Constitutional: No fever/chills Eyes: No visual changes. ENT: No  sore throat. Cardiovascular: Denies chest pain. Respiratory: Denies shortness of breath. Gastrointestinal: No abdominal pain.  No nausea, no vomiting.  No diarrhea.  No constipation. Genitourinary: Negative for dysuria. Musculoskeletal: Negative for back pain. Skin: Negative for rash. Neurological: Negative for headaches, focal weakness or numbness. Psychiatric:Anxiety Endocrine:Hypertension Hematological/Lymphatic: Allergic/Immunilogical: The medication list  PHYSICAL EXAM:  VITAL SIGNS: ED Triage Vitals  Enc Vitals Group     BP 03/10/17 0831 (!) 165/90     Pulse --      Resp 03/10/17 0831 20     Temp 03/10/17 0831 98.1 F (36.7 C)     Temp Source 03/10/17 0831 Oral     SpO2 --      Weight 03/10/17 0831 186 lb (84.4 kg)     Height --      Head Circumference --      Peak Flow --      Pain Score 03/10/17 0902 3     Pain Loc --      Pain Edu? --      Excl. in GC? --     Constitutional: Alert and oriented. Well appearing and in no acute distress. Eyes: Conjunctivae are normal. PERRL. EOMI. Head: Atraumatic. Nose: No congestion/rhinnorhea. Mouth/Throat: Mucous membranes are moist.  Oropharynx non-erythematous. Neck: No stridor.  No cervical spine tenderness to palpation. Hematological/Lymphatic/Immunilogical: No cervical lymphadenopathy. Cardiovascular: Normal rate, regular rhythm. Grossly normal heart sounds.  Good peripheral circulation. Respiratory: Normal respiratory effort.  No retractions. Lungs CTAB. Gastrointestinal: Soft and nontender. No distention. No abdominal bruits. No CVA tenderness. Musculoskeletal: No lower extremity tenderness nor edema.  No joint effusions. Neurologic:  Normal speech and language. No gross focal neurologic deficits are appreciated. No gait instability. Skin:  Skin is warm, dry and intact. No rash noted. Psychiatric: Mood and affect are normal. Speech and behavior are normal.  ____________________________________________    LABS (all labs ordered are listed, but only abnormal results are displayed)  Labs Reviewed  WET PREP, GENITAL - Abnormal; Notable for the following:       Result Value   Clue Cells Wet Prep HPF POC PRESENT (*)    All other components within normal limits  URINALYSIS, COMPLETE (UACMP) WITH MICROSCOPIC - Abnormal; Notable for the following:    Color, Urine YELLOW (*)    APPearance CLEAR (*)    Hgb urine dipstick SMALL (*)    Squamous Epithelial / LPF 0-5 (*)    All other components within normal limits  CHLAMYDIA/NGC RT PCR ([facility] ONLY)   ____________________________________________  EKG   ____________________________________________  RADIOLOGY   ____________________________________________   PROCEDURES  Procedure(s) performed: None  Procedures  Critical Care performed: No  ____________________________________________   INITIAL IMPRESSION / ASSESSMENT AND PLAN / ED COURSE  Pertinent labs & imaging results that were available during my care of the patient were reviewed by me and considered in my medical decision making (see chart for  details).  Bacterial vaginosis. Patient given discharge care instruction. Patient given a prescription for Flagyl. Patient advised follow-up family doctor in 10 days if no improvement of complaint.      ____________________________________________   FINAL CLINICAL IMPRESSION(S) / ED DIAGNOSES  Final diagnoses:  Bacterial vaginosis      NEW MEDICATIONS STARTED DURING THIS VISIT:  New Prescriptions   METRONIDAZOLE (FLAGYL) 500 MG TABLET    Take 1 tablet (500 mg total) by mouth 2 (two) times daily.     Note:  This document was prepared using Dragon voice recognition software and may include unintentional dictation errors.    Joni Reining, PA-C 03/10/17 1027    Jennye Moccasin, MD 03/10/17 [phone]

## 2017-03-10 NOTE — ED Triage Notes (Signed)
Vaginal discharge, pain since October.  Says she has tried otc meds without relief.

## 2017-05-15 ENCOUNTER — Encounter: Payer: Self-pay | Admitting: Emergency Medicine

## 2017-05-15 ENCOUNTER — Emergency Department
Admission: EM | Admit: 2017-05-15 | Discharge: 2017-05-15 | Disposition: A | Discharge status: Home / Self Care | Payer: Medicare Other | Attending: Emergency Medicine | Admitting: Emergency Medicine

## 2017-05-15 ENCOUNTER — Emergency Department: Payer: Medicare Other

## 2017-05-15 DIAGNOSIS — Y999 Unspecified external cause status: Secondary | ICD-10-CM | POA: Diagnosis not present

## 2017-05-15 DIAGNOSIS — Y939 Activity, unspecified: Secondary | ICD-10-CM | POA: Insufficient documentation

## 2017-05-15 DIAGNOSIS — S161XXA Strain of muscle, fascia and tendon at neck level, initial encounter: Secondary | ICD-10-CM | POA: Diagnosis not present

## 2017-05-15 DIAGNOSIS — Y92410 Unspecified street and highway as the place of occurrence of the external cause: Secondary | ICD-10-CM | POA: Diagnosis not present

## 2017-05-15 DIAGNOSIS — Z79899 Other long term (current) drug therapy: Secondary | ICD-10-CM | POA: Diagnosis not present

## 2017-05-15 DIAGNOSIS — I10 Essential (primary) hypertension: Secondary | ICD-10-CM | POA: Insufficient documentation

## 2017-05-15 DIAGNOSIS — R519 Headache, unspecified: Secondary | ICD-10-CM

## 2017-05-15 DIAGNOSIS — R51 Headache: Secondary | ICD-10-CM | POA: Insufficient documentation

## 2017-05-15 DIAGNOSIS — S199XXA Unspecified injury of neck, initial encounter: Secondary | ICD-10-CM | POA: Diagnosis present

## 2017-05-15 IMAGING — CR DG CHEST 2V
2 series · 2 of 2 positions shown · non-contrast
Comparison: 03/30/2016

CLINICAL DATA: Central and left chest pain. Hypertension. Symptoms
for 6 months. Pain worse today.

EXAM:
CHEST  2 VIEW

[chest pa]
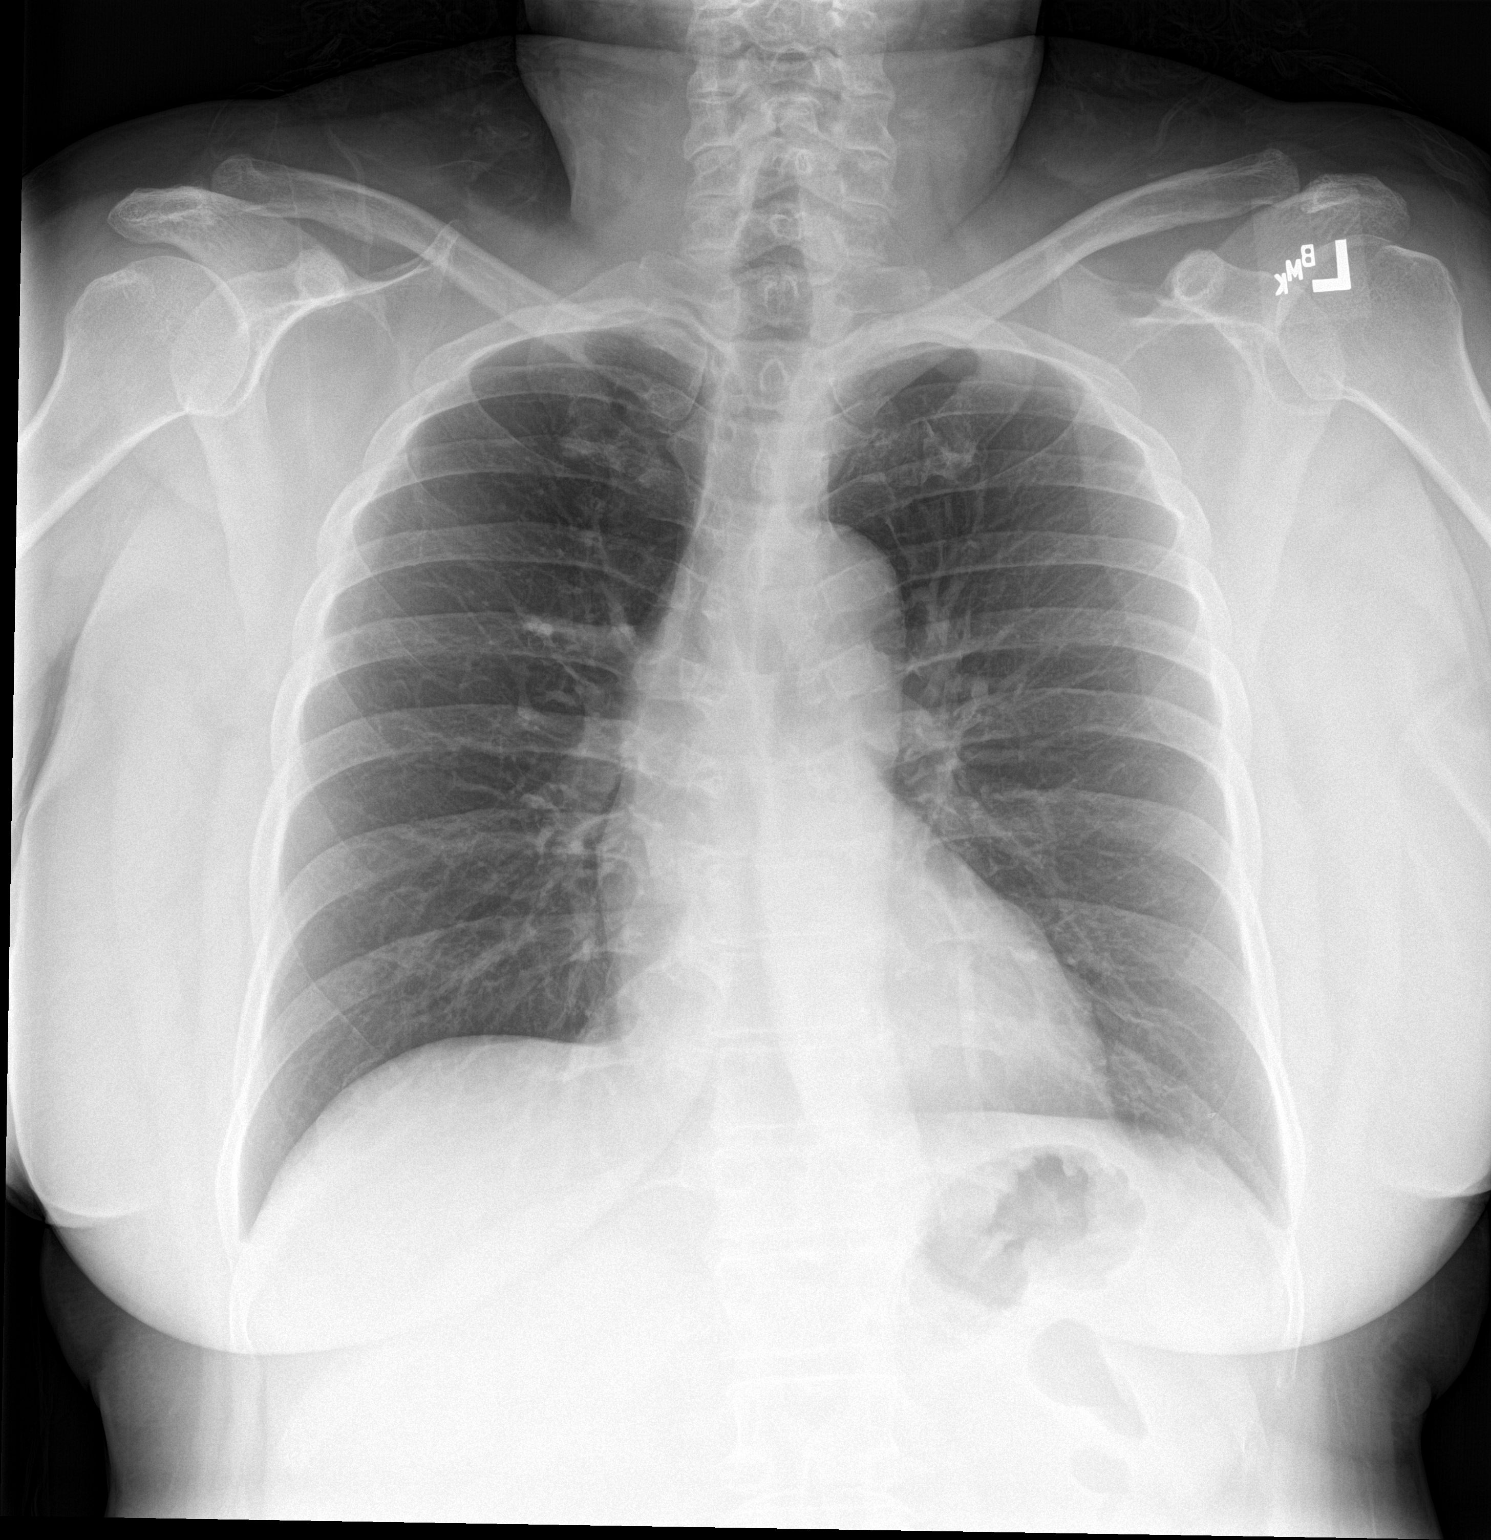

[chest lat]
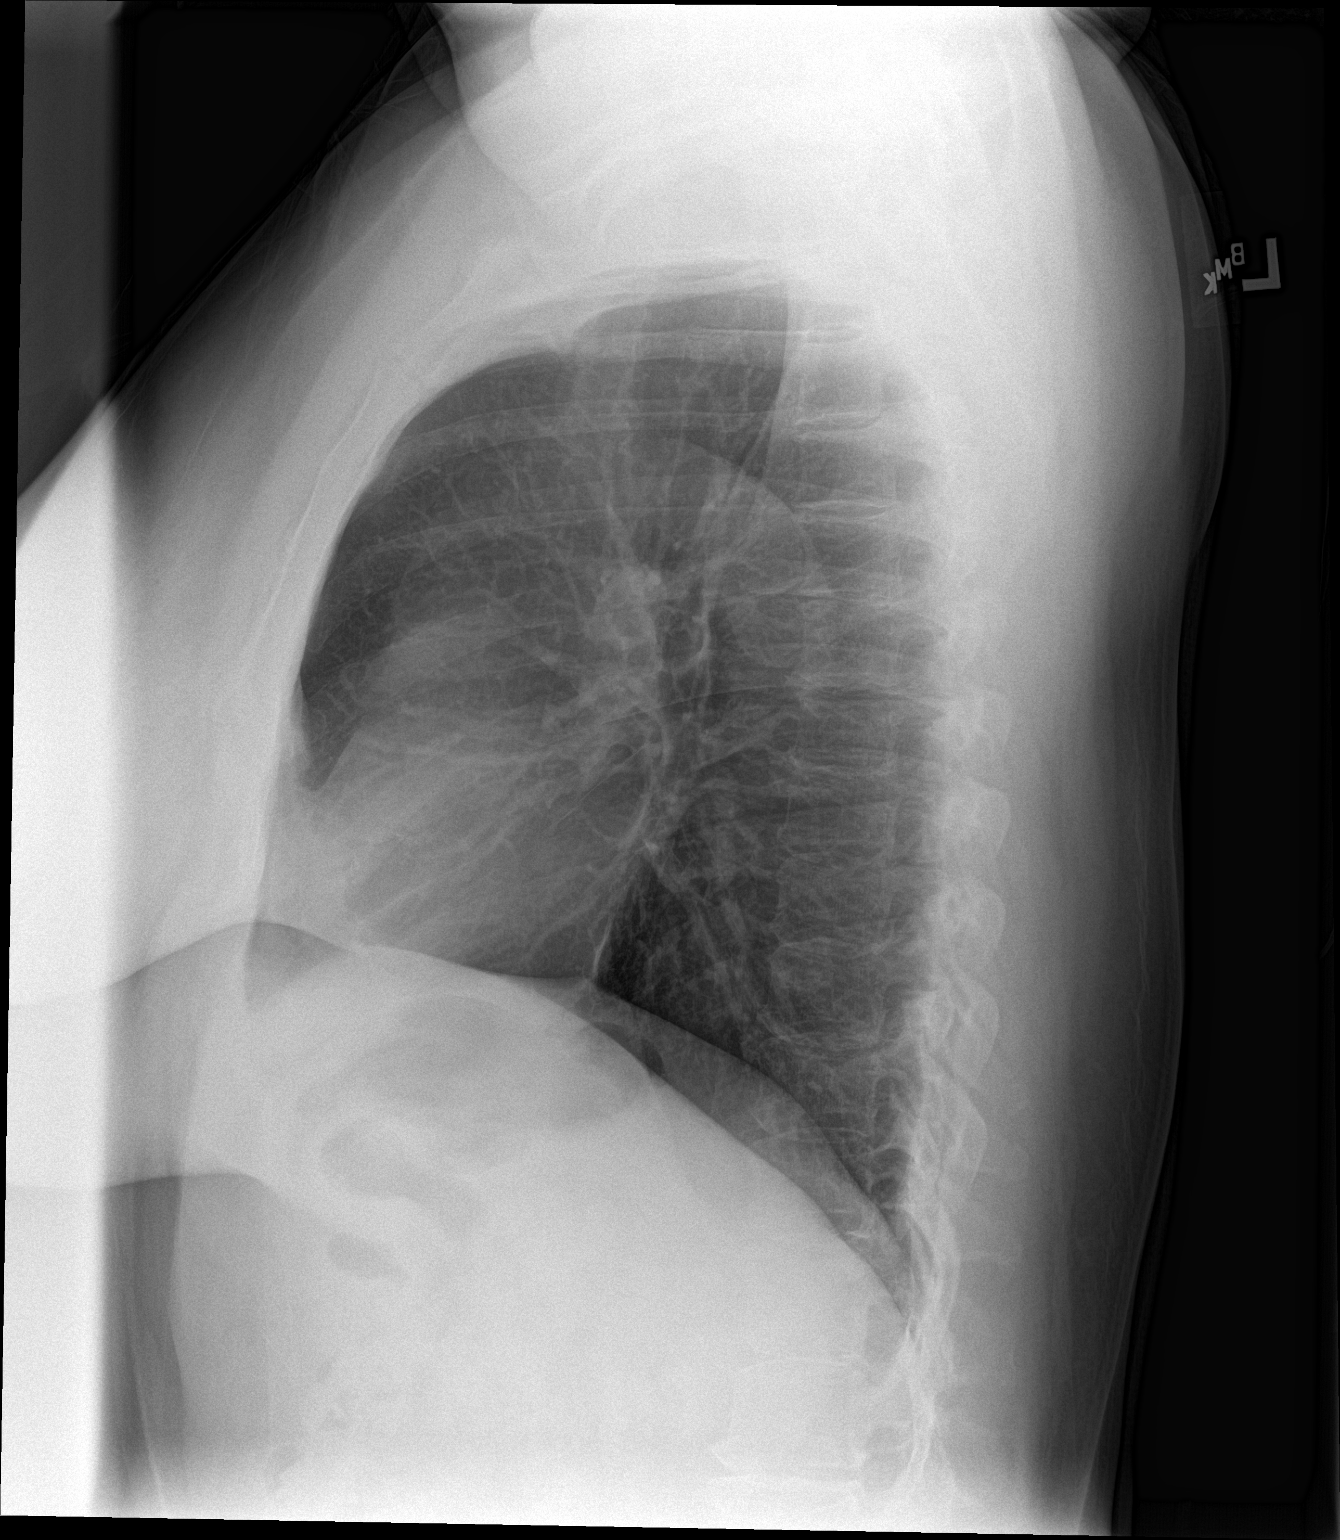

[2 of 2 positions shown; findings below may reference images not displayed]

FINDINGS: The heart size and mediastinal contours are within normal limits.
Both lungs are clear. The visualized skeletal structures are
unremarkable.
IMPRESSION: No active cardiopulmonary disease.

## 2017-05-15 MED ORDER — CYCLOBENZAPRINE HCL 10 MG PO TABS: 10.0000 mg | ORAL_TABLET | Freq: Three times a day (TID) | ORAL | 0 refills | Status: DC | PRN
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
## 2017-05-23 IMAGING — CR DG CHEST 2V
1 series · 2 of 2 positions shown · non-contrast
Comparison: 08/13/2016

CLINICAL DATA: Fell backwards on concrete steps 2 weeks ago, left
side chest wall pain

EXAM:
CHEST  2 VIEW

[Series 1: dg chest 2 view · 0.14mm/px · 2 of 2 slices shown]
[im 1/2]
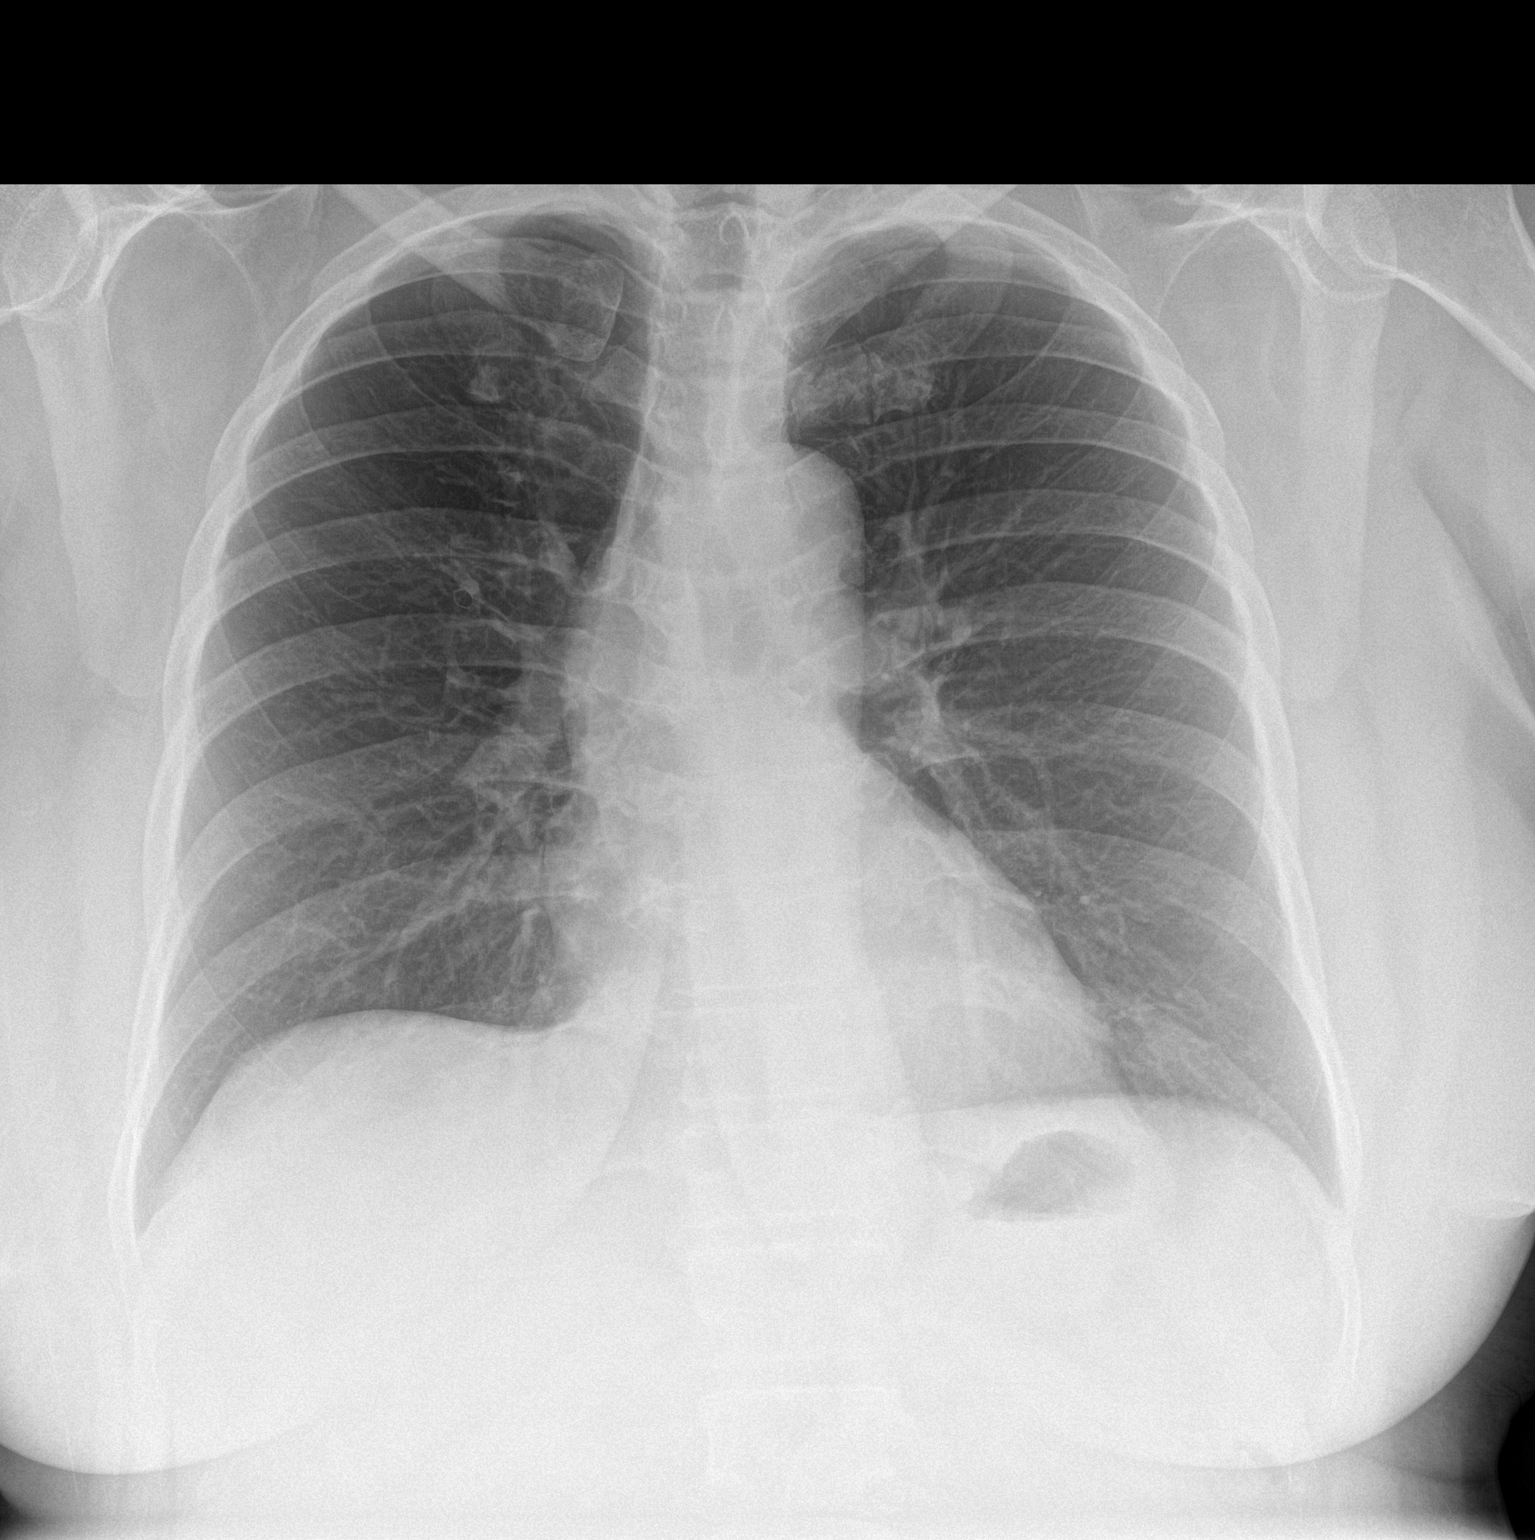
[im 2/2]
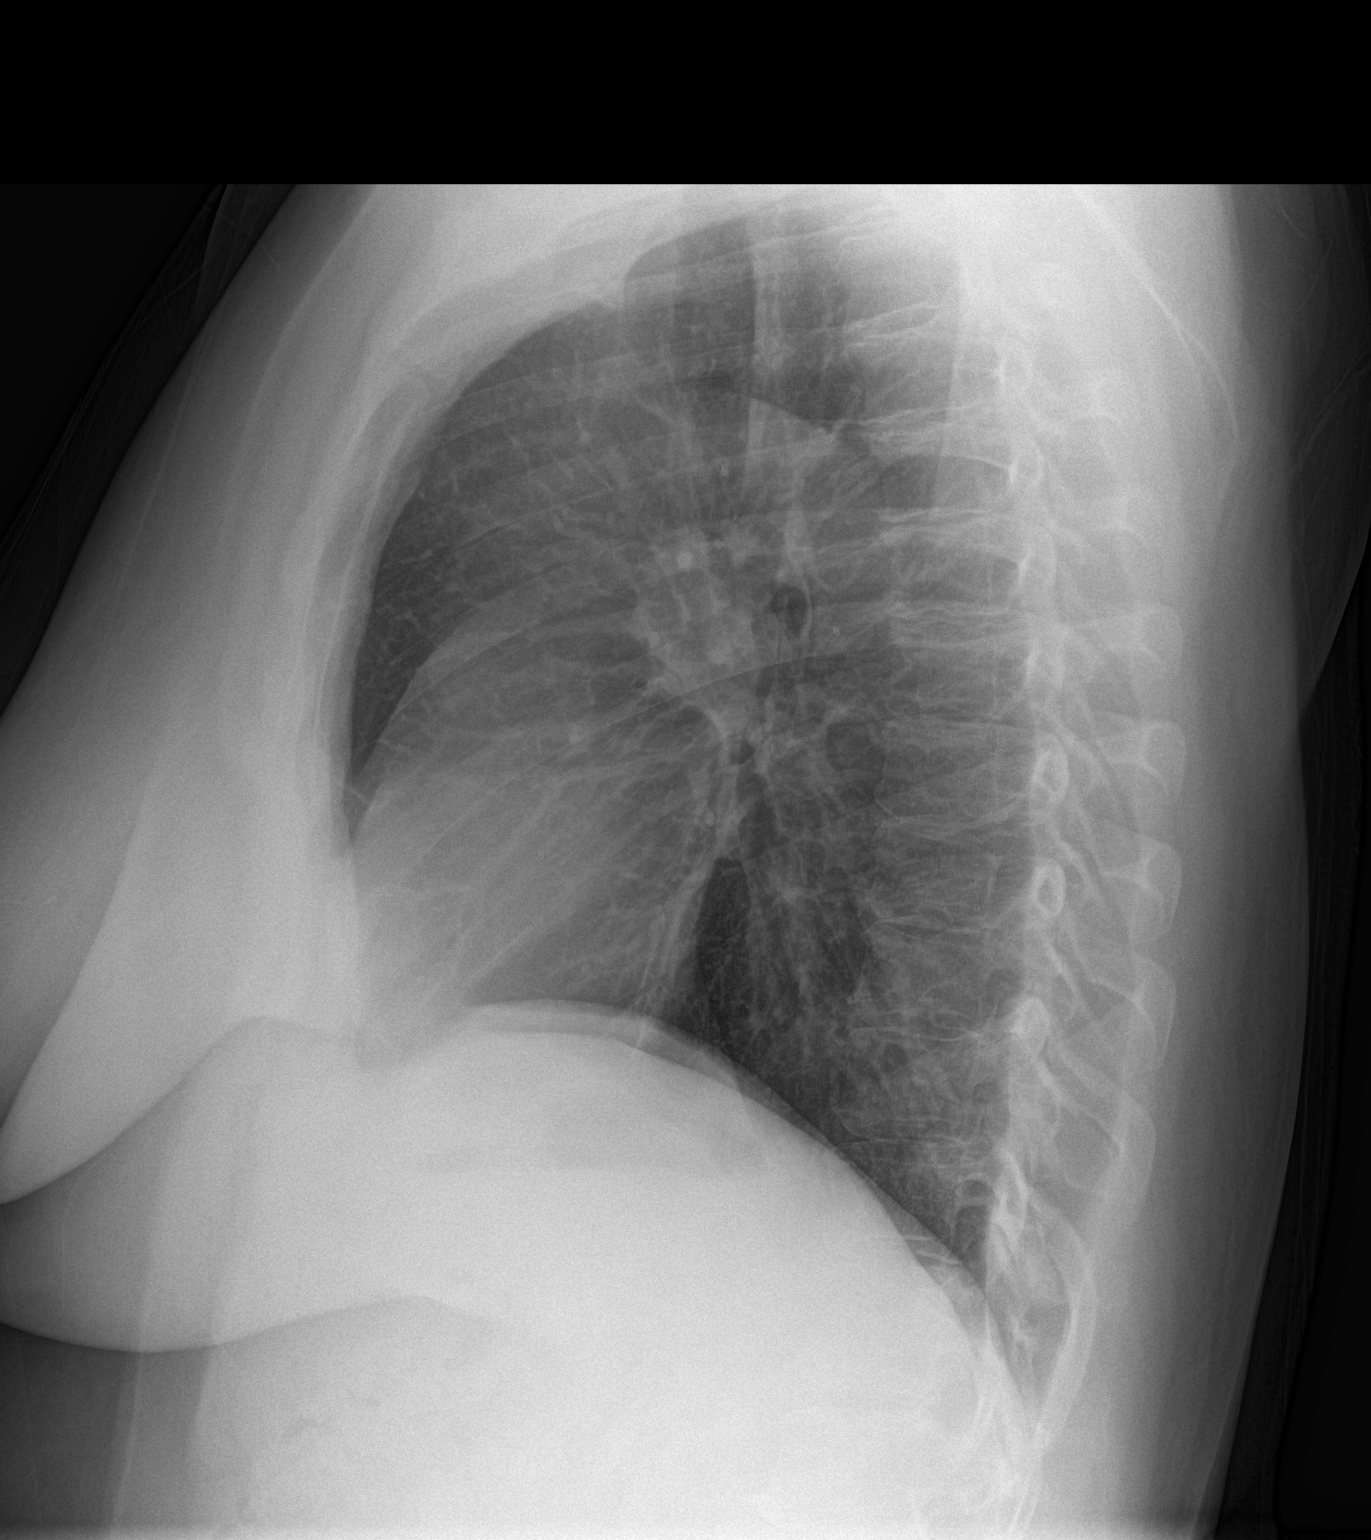

[2 of 2 positions shown; findings below may reference images not displayed]

FINDINGS: Cardiomediastinal silhouette is stable. No acute infiltrate or
pleural effusion. No pulmonary edema. No gross fractures are noted.
No pneumothorax.
IMPRESSION: No active cardiopulmonary disease.

## 2017-08-31 ENCOUNTER — Encounter: Payer: Self-pay | Admitting: Emergency Medicine

## 2017-08-31 ENCOUNTER — Emergency Department
Admission: EM | Admit: 2017-08-31 | Discharge: 2017-08-31 | Disposition: A | Discharge status: Home / Self Care | Payer: Medicare Other | Attending: Emergency Medicine | Admitting: Emergency Medicine

## 2017-08-31 DIAGNOSIS — B9689 Other specified bacterial agents as the cause of diseases classified elsewhere: Secondary | ICD-10-CM

## 2017-08-31 DIAGNOSIS — R102 Pelvic and perineal pain: Secondary | ICD-10-CM | POA: Diagnosis present

## 2017-08-31 DIAGNOSIS — Z79899 Other long term (current) drug therapy: Secondary | ICD-10-CM | POA: Diagnosis not present

## 2017-08-31 DIAGNOSIS — N3000 Acute cystitis without hematuria: Secondary | ICD-10-CM

## 2017-08-31 DIAGNOSIS — N760 Acute vaginitis: Secondary | ICD-10-CM | POA: Diagnosis not present

## 2017-08-31 DIAGNOSIS — I10 Essential (primary) hypertension: Secondary | ICD-10-CM | POA: Diagnosis not present

## 2017-08-31 LAB — URINALYSIS, ROUTINE W REFLEX MICROSCOPIC
Bacteria, UA: NONE SEEN
Bilirubin Urine: NEGATIVE
GLUCOSE, UA: NEGATIVE mg/dL
Hgb urine dipstick: NEGATIVE
Ketones, ur: NEGATIVE mg/dL
NITRITE: NEGATIVE
Protein, ur: 100 mg/dL — AB
SPECIFIC GRAVITY, URINE: 1.025 (ref 1.005–1.030)
pH: 5 (ref 5.0–8.0)

## 2017-08-31 LAB — WET PREP, GENITAL
SPERM: NONE SEEN
Trich, Wet Prep: NONE SEEN
WBC, Wet Prep HPF POC: NONE SEEN
Yeast Wet Prep HPF POC: NONE SEEN

## 2017-08-31 MED ORDER — METRONIDAZOLE 500 MG PO TABS: 500.0000 mg | ORAL_TABLET | Freq: Two times a day (BID) | ORAL | 0 refills | Status: DC

## 2017-08-31 MED ORDER — CIPROFLOXACIN HCL 500 MG PO TABS: 500.0000 mg | ORAL_TABLET | Freq: Two times a day (BID) | ORAL | 0 refills | Status: DC

## 2017-08-31 NOTE — ED Provider Notes (Signed)
[facility] Emergency Department Provider Note  ____________________________________________  Time seen: Approximately 3:37 PM  I have reviewed the triage vital signs and the nursing notes.   HISTORY  Chief Complaint Pelvic Pain    HPI Elizabeth Coffey is a 56 y.o. female who presents to the emergency department for evaluation of abdominal pain x 2 months. She had unprotected intercourse with a boyfriend around that time, but has since separated from him. She denies vaginal discharge, nausea, vomiting, or diarrhea. Pain is in the lower abdomen. History of hysterectomy. No vaginal discharge. No dysuria/frequency.  Past Medical History:  Diagnosis Date  . Anxiety   . Arthritis   . Hypertension   . Peptic ulcer     There are no active problems to display for this patient.   Past Surgical History:  Procedure Laterality Date  . ABDOMINAL HYSTERECTOMY    . BREAST CYST EXCISION      Prior to Admission medications   Medication Sig Start Date End Date Taking? Authorizing Provider  albuterol-ipratropium (COMBIVENT) 18-103 MCG/ACT inhaler Inhale 1 puff into the lungs every 4 (four) hours as needed for wheezing or shortness of breath. 09/29/15   Beers, Charmayne Sheer, PA-C  amLODipine (NORVASC) 10 MG tablet Take 1 tablet (10 mg total) by mouth daily. 10/20/16 10/20/17  Rockne Menghini, MD  amLODipine-benazepril (LOTREL) 10-20 MG capsule Take 1 capsule by mouth daily. 08/13/16   Jeanmarie Plant, MD  azithromycin (ZITHROMAX Z-PAK) 250 MG tablet Take 2 tablets (500 mg) on  Day 1,  followed by 1 tablet (250 mg) once daily on Days 2 through 5. 09/29/15   Beers, Charmayne Sheer, PA-C  ciprofloxacin (CIPRO) 500 MG tablet Take 1 tablet (500 mg total) by mouth 2 (two) times daily. 08/31/17   Burnis Halling, Rulon Eisenmenger B, FNP  cyclobenzaprine (FLEXERIL) 10 MG tablet Take 1 tablet (10 mg total) by mouth 3 (three) times daily as needed for muscle spasms. 05/15/17   Jag Lenz B, FNP  etodolac  (LODINE) 500 MG tablet Take 500 mg by mouth 2 (two) times daily.    [provider]  FLUoxetine (PROZAC) 20 MG capsule Take 20 mg by mouth daily.    [provider]  guaiFENesin-codeine 100-10 MG/5ML syrup Take 10 mLs by mouth every 4 (four) hours as needed for cough. 09/29/15   Beers, Charmayne Sheer, PA-C  hydrOXYzine (ATARAX/VISTARIL) 10 MG tablet Take 1 tablet (10 mg total) by mouth 3 (three) times daily as needed. 06/30/15   Arnaldo Natal, MD  hydrOXYzine (ATARAX/VISTARIL) 25 MG tablet Take 25 mg by mouth 3 (three) times daily as needed.    [provider]  meloxicam (MOBIC) 15 MG tablet Take 15 mg by mouth daily.    [provider]  metroNIDAZOLE (FLAGYL) 500 MG tablet Take 1 tablet (500 mg total) by mouth 2 (two) times daily. 08/31/17   Lakota Markgraf, Rulon Eisenmenger B, FNP  naproxen (NAPROSYN) 500 MG tablet Take 1 tablet (500 mg total) by mouth 2 (two) times daily with a meal. 08/03/16   Jene Every, MD  omeprazole (PRILOSEC) 40 MG capsule Take 40 mg by mouth daily.    [provider]  permethrin (ELIMITE) 5 % cream Apply 1 application topically once. 06/30/15   Arnaldo Natal, MD  potassium chloride 20 MEQ TBCR Take 20 mEq by mouth daily. 08/13/16   Jeanmarie Plant, MD  pseudoephedrine (SUDAFED) 60 MG tablet Take 1 tablet (60 mg total) by mouth every 4 (four) hours as needed  for congestion. 09/29/15   Beers, Charmayne Sheerharles M, PA-C  triamterene-hydrochlorothiazide (MAXZIDE-25) 37.5-25 MG tablet Take 1 tablet by mouth daily. 08/13/16   Jeanmarie PlantMcShane, James A, MD    Allergies Aspirin; Mobic [meloxicam]; Tramadol; and Chocolate  No family history on file.  Social History Social History  Substance Use Topics  . Smoking status: Never Smoker  . Smokeless tobacco: Never Used  . Alcohol use No    Review of Systems Constitutional: Negative for fever. Respiratory: Negative for shortness of breath or cough. Gastrointestinal: Positive for abdominal pain; negative for nausea , negative  for vomiting. Genitourinary: Negative for dysuria , negative for vaginal discharge. Musculoskeletal: Negative for back pain. Skin: Negative for rash, lesion, or wound. ____________________________________________   PHYSICAL EXAM:  VITAL SIGNS: ED Triage Vitals  Enc Vitals Group     BP 08/31/17 1517 (!) 179/90     Pulse Rate 08/31/17 1517 73     Resp 08/31/17 1517 20     Temp 08/31/17 1517 98.3 F (36.8 C)     Temp Source 08/31/17 1517 Oral     SpO2 08/31/17 1517 97 %     Weight 08/31/17 1516 198 lb (89.8 kg)     Height --      Head Circumference --      Peak Flow --      Pain Score 08/31/17 1516 5     Pain Loc --      Pain Edu? --      Excl. in GC? --     Constitutional: Alert and oriented. Well appearing and in no acute distress. Eyes: Conjunctivae are normal. PERRL. EOMI. Head: Atraumatic. Nose: No congestion/rhinnorhea. Mouth/Throat: Mucous membranes are moist. Respiratory: Normal respiratory effort.  No retractions. Gastrointestinal: Suprapubic tenderness on exam.  Abdomen is soft without guarding or rebound tenderness.  Genitourinary: Pelvic exam: Thin, white exudate noted on vaginal walls. Uterus and cervix surgically absent. Musculoskeletal: No extremity tenderness nor edema.  Neurologic:  Normal speech and language. No gross focal neurologic deficits are appreciated. Speech is normal. No gait instability. Skin:  Skin is warm, dry and intact. No rash noted. Psychiatric: Mood and affect are normal. Speech and behavior are normal.  ____________________________________________   LABS (all labs ordered are listed, but only abnormal results are displayed)  Labs Reviewed  WET PREP, GENITAL - Abnormal; Notable for the following:       Result Value   Clue Cells Wet Prep HPF POC PRESENT (*)    All other components within normal limits  URINALYSIS, ROUTINE W REFLEX MICROSCOPIC - Abnormal; Notable for the following:    Color, Urine AMBER (*)    APPearance CLEAR (*)     Protein, ur 100 (*)    Leukocytes, UA TRACE (*)    Squamous Epithelial / LPF 0-5 (*)    All other components within normal limits   ____________________________________________  RADIOLOGY  Not indicated. ____________________________________________   PROCEDURES  Procedure(s) performed: None  ____________________________________________  56 year old female presenting to the emergency department for treatment and evaluation of vaginal itching, urinary frequency, and suprapubic abdominal pain that has been present for the past couple of months. She will be treated with Flagyl and Cipro. She was advised to follow-up with her primary care provider for symptoms that are not improving over the next week or so. She was instructed to return to the emergency department for symptoms that change or worsen if she is unable schedule an appointment.  INITIAL IMPRESSION / ASSESSMENT AND PLAN / ED COURSE  Pertinent labs & imaging results that were available during my care of the patient were reviewed by me and considered in my medical decision making (see chart for details).  ____________________________________________   FINAL CLINICAL IMPRESSION(S) / ED DIAGNOSES  Final diagnoses:  BV (bacterial vaginosis)  Acute cystitis without hematuria    Note:  This document was prepared using Dragon voice recognition software and may include unintentional dictation errors.    Chinita Pester, FNP 08/31/17 1750    Phineas Semen, MD 08/31/17 [phone]

## 2017-08-31 NOTE — ED Triage Notes (Signed)
Pt wants to be seen for possible STD. C/o pelvic pain.

## 2017-08-31 NOTE — ED Notes (Signed)
See triage note  Presents with some pelvic discomfort and vaginal itching  Was seen couple of months ago for same

## 2017-08-31 NOTE — ED Notes (Signed)
AAOx3.  Skin warm and dry.  NAD 

## 2017-08-31 NOTE — Discharge Instructions (Signed)
Taken medication until finished. Follow-up with your primary care provider for symptoms that are not improving over the week. Return to the emergency department for symptoms that change or worsen if you're unable schedule an appointment.

## 2017-09-27 DIAGNOSIS — F418 Other specified anxiety disorders: Secondary | ICD-10-CM | POA: Insufficient documentation

## 2017-12-02 ENCOUNTER — Encounter: Payer: Self-pay | Admitting: Emergency Medicine

## 2017-12-02 ENCOUNTER — Emergency Department: Payer: Medicare Other

## 2017-12-02 ENCOUNTER — Emergency Department
Admission: EM | Admit: 2017-12-02 | Discharge: 2017-12-02 | Disposition: A | Discharge status: Home / Self Care | Payer: Medicare Other | Attending: Emergency Medicine | Admitting: Emergency Medicine

## 2017-12-02 DIAGNOSIS — I10 Essential (primary) hypertension: Secondary | ICD-10-CM | POA: Insufficient documentation

## 2017-12-02 DIAGNOSIS — S161XXA Strain of muscle, fascia and tendon at neck level, initial encounter: Secondary | ICD-10-CM

## 2017-12-02 DIAGNOSIS — Y939 Activity, unspecified: Secondary | ICD-10-CM | POA: Diagnosis not present

## 2017-12-02 DIAGNOSIS — Y929 Unspecified place or not applicable: Secondary | ICD-10-CM | POA: Diagnosis not present

## 2017-12-02 DIAGNOSIS — Z79899 Other long term (current) drug therapy: Secondary | ICD-10-CM | POA: Insufficient documentation

## 2017-12-02 DIAGNOSIS — Y999 Unspecified external cause status: Secondary | ICD-10-CM | POA: Diagnosis not present

## 2017-12-02 DIAGNOSIS — W19XXXA Unspecified fall, initial encounter: Secondary | ICD-10-CM | POA: Diagnosis not present

## 2017-12-02 DIAGNOSIS — E860 Dehydration: Secondary | ICD-10-CM

## 2017-12-02 DIAGNOSIS — R42 Dizziness and giddiness: Secondary | ICD-10-CM | POA: Diagnosis not present

## 2017-12-02 DIAGNOSIS — S199XXA Unspecified injury of neck, initial encounter: Secondary | ICD-10-CM | POA: Diagnosis present

## 2017-12-02 LAB — CBC
HEMATOCRIT: 41.8 % (ref 35.0–47.0)
HEMOGLOBIN: 13.9 g/dL (ref 12.0–16.0)
MCH: 27.4 pg (ref 26.0–34.0)
MCHC: 33.2 g/dL (ref 32.0–36.0)
MCV: 82.6 fL (ref 80.0–100.0)
Platelets: 297 10*3/uL (ref 150–440)
RBC: 5.06 MIL/uL (ref 3.80–5.20)
RDW: 15.7 % — ABNORMAL HIGH (ref 11.5–14.5)
WBC: 8.4 10*3/uL (ref 3.6–11.0)

## 2017-12-02 LAB — URINALYSIS, COMPLETE (UACMP) WITH MICROSCOPIC
BACTERIA UA: NONE SEEN
BILIRUBIN URINE: NEGATIVE
Glucose, UA: NEGATIVE mg/dL
HGB URINE DIPSTICK: NEGATIVE
KETONES UR: NEGATIVE mg/dL
LEUKOCYTES UA: NEGATIVE
NITRITE: NEGATIVE
Protein, ur: NEGATIVE mg/dL
RBC / HPF: NONE SEEN RBC/hpf (ref 0–5)
Specific Gravity, Urine: 1.013 (ref 1.005–1.030)
pH: 6 (ref 5.0–8.0)

## 2017-12-02 LAB — COMPREHENSIVE METABOLIC PANEL
ALK PHOS: 57 U/L (ref 38–126)
ALT: 21 U/L (ref 14–54)
ANION GAP: 9 (ref 5–15)
AST: 26 U/L (ref 15–41)
Albumin: 3.5 g/dL (ref 3.5–5.0)
BILIRUBIN TOTAL: 0.5 mg/dL (ref 0.3–1.2)
BUN: 23 mg/dL — ABNORMAL HIGH (ref 6–20)
CALCIUM: 9 mg/dL (ref 8.9–10.3)
CO2: 23 mmol/L (ref 22–32)
Chloride: 106 mmol/L (ref 101–111)
Creatinine, Ser: 1.05 mg/dL — ABNORMAL HIGH (ref 0.44–1.00)
GFR calc non Af Amer: 58 mL/min — ABNORMAL LOW (ref >60)
Glucose, Bld: 101 mg/dL — ABNORMAL HIGH (ref 65–99)
POTASSIUM: 3.5 mmol/L (ref 3.5–5.1)
SODIUM: 138 mmol/L (ref 135–145)
TOTAL PROTEIN: 6.9 g/dL (ref 6.5–8.1)

## 2017-12-02 LAB — TROPONIN I: Troponin I: <0.03 ng/mL (ref <0.03)

## 2017-12-02 NOTE — ED Triage Notes (Signed)
Pt reports has had numbness to right lip/face and left neck for 2 days.  She then donated plasma yesterday and has had SHOB since donating plasma yesterday but has also had a cough for a month.  SHOB is all the time even at rest per pt. Unlabored currently. Reports has been trembling all over as well.  Syncope after donating plasma yesterday. Ambulatory with steady gait to triage. No unilateral weakness.  No facial droop or slurred speech at this time.  Also has pain "shooting down chest".  Alert and oriented at this time.

## 2017-12-02 NOTE — ED Provider Notes (Signed)
[facility] Emergency Department Provider Note  ____________________________________________  Time seen: Approximately 6:40 PM  I have reviewed the triage vital signs and the nursing notes.   HISTORY  Chief Complaint Shortness of Breath    HPI Elizabeth Coffey is a 56 y.o. female who presents with multiple complaints. First feeling anxious and restless. Second, having altered sensation and numbness of the right side of the face for the past month. Also complains of dysuria for 2 weeks. Also reports that after donating plasma yesterday she got dizzy and passed out. Only pain is in the left neck, worse with moving her head around. No aggravating or alleviating factors other than head movement. Mild to moderate intensity. Symptoms are Intermittent since onset.   Past Medical History:  Diagnosis Date  . Anxiety   . Arthritis   . Hypertension   . Peptic ulcer      There are no active problems to display for this patient.    Past Surgical History:  Procedure Laterality Date  . ABDOMINAL HYSTERECTOMY    . BREAST CYST EXCISION       Prior to Admission medications   Medication Sig Start Date End Date Taking? Authorizing Provider  albuterol-ipratropium (COMBIVENT) 18-103 MCG/ACT inhaler Inhale 1 puff into the lungs every 4 (four) hours as needed for wheezing or shortness of breath. 09/29/15   Beers, Charmayne Sheerharles M, PA-C  amLODipine (NORVASC) 10 MG tablet Take 1 tablet (10 mg total) by mouth daily. 10/20/16 10/20/17  Rockne MenghiniNorman, Anne-Caroline, MD  amLODipine-benazepril (LOTREL) 10-20 MG capsule Take 1 capsule by mouth daily. 08/13/16   Jeanmarie PlantMcShane, James A, MD  azithromycin (ZITHROMAX Z-PAK) 250 MG tablet Take 2 tablets (500 mg) on  Day 1,  followed by 1 tablet (250 mg) once daily on Days 2 through 5. 09/29/15   Beers, Charmayne Sheerharles M, PA-C  ciprofloxacin (CIPRO) 500 MG tablet Take 1 tablet (500 mg total) by mouth 2 (two) times daily. 08/31/17   Triplett, Rulon Eisenmengerari B, FNP  cyclobenzaprine  (FLEXERIL) 10 MG tablet Take 1 tablet (10 mg total) by mouth 3 (three) times daily as needed for muscle spasms. 05/15/17   Triplett, Cari B, FNP  etodolac (LODINE) 500 MG tablet Take 500 mg by mouth 2 (two) times daily.    [provider]  FLUoxetine (PROZAC) 20 MG capsule Take 20 mg by mouth daily.    [provider]  guaiFENesin-codeine 100-10 MG/5ML syrup Take 10 mLs by mouth every 4 (four) hours as needed for cough. 09/29/15   Beers, Charmayne Sheerharles M, PA-C  hydrOXYzine (ATARAX/VISTARIL) 10 MG tablet Take 1 tablet (10 mg total) by mouth 3 (three) times daily as needed. 06/30/15   Arnaldo NatalMalinda, Paul F, MD  hydrOXYzine (ATARAX/VISTARIL) 25 MG tablet Take 25 mg by mouth 3 (three) times daily as needed.    [provider]  meloxicam (MOBIC) 15 MG tablet Take 15 mg by mouth daily.    [provider]  metroNIDAZOLE (FLAGYL) 500 MG tablet Take 1 tablet (500 mg total) by mouth 2 (two) times daily. 08/31/17   Triplett, Rulon Eisenmengerari B, FNP  naproxen (NAPROSYN) 500 MG tablet Take 1 tablet (500 mg total) by mouth 2 (two) times daily with a meal. 08/03/16   Jene EveryKinner, Robert, MD  omeprazole (PRILOSEC) 40 MG capsule Take 40 mg by mouth daily.    [provider]  permethrin (ELIMITE) 5 % cream Apply 1 application topically once. 06/30/15   Arnaldo NatalMalinda, Paul F, MD  potassium chloride 20 MEQ TBCR Take 20  mEq by mouth daily. 08/13/16   Jeanmarie PlantMcShane, James A, MD  pseudoephedrine (SUDAFED) 60 MG tablet Take 1 tablet (60 mg total) by mouth every 4 (four) hours as needed for congestion. 09/29/15   Beers, Charmayne Sheerharles M, PA-C  triamterene-hydrochlorothiazide (MAXZIDE-25) 37.5-25 MG tablet Take 1 tablet by mouth daily. 08/13/16   Jeanmarie PlantMcShane, James A, MD     Allergies Aspirin; Mobic [meloxicam]; Tramadol; and Chocolate   History reviewed. No pertinent family history.  Social History Social History   Tobacco Use  . Smoking status: Never Smoker  . Smokeless tobacco: Never Used  Substance Use Topics  . Alcohol use: No   . Drug use: No    Review of Systems  Constitutional:   No fever or chills.  ENT:   No sore throat. No rhinorrhea. Cardiovascular:   No chest pain , positive syncope yesterday after donating plasma. Respiratory:   No dyspnea or cough. Gastrointestinal:   Negative for abdominal pain, vomiting and diarrhea.  Musculoskeletal:   Negative for focal pain or swelling All other systems reviewed and are negative except as documented above in ROS and HPI.  ____________________________________________   PHYSICAL EXAM:  VITAL SIGNS: ED Triage Vitals  Enc Vitals Group     BP 12/02/17 1353 (!) 151/86     Pulse Rate 12/02/17 1353 (!) 112     Resp 12/02/17 1353 20     Temp 12/02/17 1353 98.4 F (36.9 C)     Temp Source 12/02/17 1353 Oral     SpO2 12/02/17 1353 95 %     Weight 12/02/17 1353 205 lb (93 kg)     Height 12/02/17 1353 5\' 4"  (1.626 m)     Head Circumference --      Peak Flow --      Pain Score 12/02/17 1640 0     Pain Loc --      Pain Edu? --      Excl. in GC? --     Vital signs reviewed, nursing assessments reviewed.   Constitutional:   Alert and oriented. Well appearing and in no distress. Eyes:   No scleral icterus.  EOMI. No nystagmus. No conjunctival pallor. PERRL. ENT   Head:   Normocephalic and atraumatic.   Nose:   No congestion/rhinnorhea.    Mouth/Throat:   MMM, no pharyngeal erythema. No peritonsillar mass.    Neck:   No meningismus. Full ROM. No midline tenderness. Focal tenderness of the left sternocleidomastoid muscle. Pain with rotation of the head to the right Hematological/Lymphatic/Immunilogical:   No cervical lymphadenopathy. Cardiovascular:   RRR. Symmetric bilateral radial and DP pulses.  No murmurs.  Respiratory:   Normal respiratory effort without tachypnea/retractions. Breath sounds are clear and equal bilaterally. No wheezes/rales/rhonchi. Gastrointestinal:   Soft and nontender. Non distended. There is no CVA tenderness.  No rebound,  rigidity, or guarding. Genitourinary:   deferred Musculoskeletal:   Normal range of motion in all extremities. No joint effusions.  No lower extremity tenderness.  No edema. Neurologic:   Normal speech and language.  Motor grossly intact. No gross focal neurologic deficits are appreciated.  Skin:    Skin is warm, dry and intact. No rash noted.  No petechiae, purpura, or bullae.  ____________________________________________    LABS (pertinent positives/negatives) (all labs ordered are listed, but only abnormal results are displayed) Labs Reviewed  CBC - Abnormal; Notable for the following components:      Result Value   RDW 15.7 (*)    All other components within  normal limits  URINALYSIS, COMPLETE (UACMP) WITH MICROSCOPIC - Abnormal; Notable for the following components:   Color, Urine YELLOW (*)    APPearance CLEAR (*)    Squamous Epithelial / LPF 0-5 (*)    All other components within normal limits  COMPREHENSIVE METABOLIC PANEL - Abnormal; Notable for the following components:   Glucose, Bld 101 (*)    BUN 23 (*)    Creatinine, Ser 1.05 (*)    GFR calc non Af Amer 58 (*)    All other components within normal limits  TROPONIN I   ____________________________________________   EKG  Interpreted by me Sinus tachycardia rate 103, normal axis and intervals. Normal QRS ST segments and T waves  ____________________________________________    RADIOLOGY  Dg Chest 2 View  Result Date: 12/02/2017 CLINICAL DATA:  Short of breath EXAM: CHEST  2 VIEW COMPARISON:  10/20/2016 FINDINGS: The heart size and mediastinal contours are within normal limits. Both lungs are clear. The visualized skeletal structures are unremarkable. IMPRESSION: No active cardiopulmonary disease. Electronically Signed   By: Marlan Palau M.D.   On: 12/02/2017 14:35   Ct Head Wo Contrast  Result Date: 12/02/2017 CLINICAL DATA:  56 year old female with numbness in the right face and left neck for 2 days.  Syncope after donating plasma yesterday. Headache. EXAM: CT HEAD WITHOUT CONTRAST TECHNIQUE: Contiguous axial images were obtained from the base of the skull through the vertex without intravenous contrast. COMPARISON:  Head CT without contrast 05/15/2017 and earlier. FINDINGS: Brain: Cerebral volume is stable and within normal limits. No midline shift, ventriculomegaly, mass effect, evidence of mass lesion, intracranial hemorrhage or evidence of cortically based acute infarction. Minimal inferior frontal white matter hypodensity appears stable. Otherwise gray-white mter matter differentiation is within normal limits throughout the brain. Vascular: Calcified atherosclerosis at the skull base. No suspicious intracranial vascular hyperdensity. Skull: Negative.  No acute osseous abnormality identified. Sinuses/Orbits: Visualized paranasal sinuses and mastoids are stable and well pneumatized. Other: Visualized orbits and scalp soft tissues are within normal limits. IMPRESSION: Noncontrast head CT is stable since 2017 and largely unremarkable for age; minimal to mild nonspecific white matter changes. Electronically Signed   By: Odessa Fleming M.D.   On: 12/02/2017 18:31    ____________________________________________   PROCEDURES Procedures  ____________________________________________   DIFFERENTIAL DIAGNOSIS  Stroke, intracranial mass  CLINICAL IMPRESSION / ASSESSMENT AND PLAN / ED COURSE  Pertinent labs & imaging results that were available during my care of the patient were reviewed by me and considered in my medical decision making (see chart for details).   Patient well-appearing no acute distress, unremarkable vital signs on my exam, presents with fatigue and dyspnea since donating plasma yesterday. Exam is reassuring, neurologically intact, most likely due to the dehydration effects of blood donation. However, due to her subjective neurologic symptom in the setting of syncope, obtain a CT scan to  further risk stratify. No acute findings on the CT, no evidence of intracranial hemorrhage or stroke, no evidence of any mass. No further workup indicated at this time. Follow-up with primary care. Low suspicion for ACS PE dissection. No evidence of infection or sepsis.      ____________________________________________   FINAL CLINICAL IMPRESSION(S) / ED DIAGNOSES    Final diagnoses:  Dehydration  Strain of sternocleidomastoid muscle, initial encounter      This SmartLink is deprecated. Use AVSMEDLIST instead to display the medication list for a patient.   Portions of this note were generated with dragon dictation software. Dictation  errors may occur despite best attempts at proofreading.    Sharman Cheek, MD 12/02/17 [id number]

## 2017-12-02 NOTE — ED Notes (Signed)
Patient is complaining of numbness and tingling to her mouth x 1 week and somewhat to the right side of her face.  Patient also is complaining of dysuria x 2 weeks and foul urinary odor.  Patient reports occasional tremors and dry mouth x 1 month.

## 2017-12-02 NOTE — Discharge Instructions (Signed)
Your labs, chest xray, and CT scan of the head are all unremarkable. Continue to drink lots of fluids and eat regularly to maintain good hydration. Use a heating pad and gentle range of motion exercises on your neck for the muscle strain.

## 2017-12-08 ENCOUNTER — Emergency Department
Admission: EM | Admit: 2017-12-08 | Discharge: 2017-12-08 | Disposition: A | Discharge status: Home / Self Care | Payer: Medicare Other | Attending: Emergency Medicine | Admitting: Emergency Medicine

## 2017-12-08 ENCOUNTER — Other Ambulatory Visit: Payer: Self-pay

## 2017-12-08 DIAGNOSIS — T783XXA Angioneurotic edema, initial encounter: Secondary | ICD-10-CM | POA: Diagnosis not present

## 2017-12-08 DIAGNOSIS — Z79899 Other long term (current) drug therapy: Secondary | ICD-10-CM | POA: Insufficient documentation

## 2017-12-08 DIAGNOSIS — R600 Localized edema: Secondary | ICD-10-CM | POA: Diagnosis present

## 2017-12-08 DIAGNOSIS — Z791 Long term (current) use of non-steroidal anti-inflammatories (NSAID): Secondary | ICD-10-CM | POA: Insufficient documentation

## 2017-12-08 DIAGNOSIS — I10 Essential (primary) hypertension: Secondary | ICD-10-CM | POA: Diagnosis not present

## 2017-12-08 MED ORDER — METHYLPREDNISOLONE SODIUM SUCC 125 MG IJ SOLR
125.0000 mg | Freq: Once | INTRAMUSCULAR | Status: AC
  Administered 2017-12-08: 125 mg via INTRAMUSCULAR
  Filled 2017-12-08: qty 2

## 2017-12-08 MED ORDER — LIDOCAINE 5 % EX OINT: 1.0000 "application " | TOPICAL_OINTMENT | CUTANEOUS | 0 refills | Status: DC | PRN

## 2017-12-08 MED ORDER — PREDNISONE 10 MG (21) PO TBPK: ORAL_TABLET | ORAL | 0 refills | Status: DC

## 2017-12-08 NOTE — Discharge Instructions (Signed)
Please stop donating plasma immediately.  If the lip swelling gets worse, return to the ER.  If it is not going down, follow up with Dr. Chilton SiShingh.

## 2017-12-08 NOTE — ED Triage Notes (Addendum)
Pt reports that she was seen Friday for lips swelling, worsening over past few days. Swelling, burning present, oozing clear fluid to upper and lower lip. Tongue is WNL. Speak normal. RR even and unlabored.

## 2017-12-08 NOTE — ED Provider Notes (Signed)
[facility] Emergency Department Provider Note  ____________________________________________   First MD Initiated Contact with Patient 12/08/17 1005     (approximate)  I have reviewed the triage vital signs and the nursing notes.   HISTORY  Chief Complaint Oral Swelling (lip swelling)  HPI Elizabeth BaldyMary A Coffey is a 56 y.o. female who presents to the emergency department for treatment and evaluation of lip swelling and oozing. She states that last Wednesday she was donating plasma and began to have lip tingling and "convulsions." She was evaluated here the next day and told to take "ibuprofen." Since then, she states that her lips have become more swollen each day but because of the snow has not been able to get out to seek treatment. She reports they were much worse this morning and now are oozing and crusting. She has donated plasma 2 times per week for about a year but has not had any trouble until now. She has not attempted any alleviating measures for this complaint. Her last plasma donation was 1 week ago.   Past Medical History:  Diagnosis Date  . Anxiety   . Arthritis   . Hypertension   . Peptic ulcer     There are no active problems to display for this patient.   Past Surgical History:  Procedure Laterality Date  . ABDOMINAL HYSTERECTOMY    . BREAST CYST EXCISION      Prior to Admission medications   Medication Sig Start Date End Date Taking? Authorizing Provider  albuterol-ipratropium (COMBIVENT) 18-103 MCG/ACT inhaler Inhale 1 puff into the lungs every 4 (four) hours as needed for wheezing or shortness of breath. 09/29/15   Beers, Charmayne Sheerharles M, PA-C  amLODipine (NORVASC) 10 MG tablet Take 1 tablet (10 mg total) by mouth daily. 10/20/16 10/20/17  Rockne MenghiniNorman, Anne-Caroline, MD  amLODipine-benazepril (LOTREL) 10-20 MG capsule Take 1 capsule by mouth daily. 08/13/16   Jeanmarie PlantMcShane, James A, MD  azithromycin (ZITHROMAX Z-PAK) 250 MG tablet Take 2 tablets (500 mg) on   Day 1,  followed by 1 tablet (250 mg) once daily on Days 2 through 5. 09/29/15   Beers, Charmayne Sheerharles M, PA-C  ciprofloxacin (CIPRO) 500 MG tablet Take 1 tablet (500 mg total) by mouth 2 (two) times daily. 08/31/17   Tilla Wilborn, Rulon Eisenmengerari B, FNP  cyclobenzaprine (FLEXERIL) 10 MG tablet Take 1 tablet (10 mg total) by mouth 3 (three) times daily as needed for muscle spasms. 05/15/17   Antonea Gaut B, FNP  etodolac (LODINE) 500 MG tablet Take 500 mg by mouth 2 (two) times daily.    [provider]  FLUoxetine (PROZAC) 20 MG capsule Take 20 mg by mouth daily.    [provider]  guaiFENesin-codeine 100-10 MG/5ML syrup Take 10 mLs by mouth every 4 (four) hours as needed for cough. 09/29/15   Beers, Charmayne Sheerharles M, PA-C  hydrOXYzine (ATARAX/VISTARIL) 10 MG tablet Take 1 tablet (10 mg total) by mouth 3 (three) times daily as needed. 06/30/15   Arnaldo NatalMalinda, Paul F, MD  hydrOXYzine (ATARAX/VISTARIL) 25 MG tablet Take 25 mg by mouth 3 (three) times daily as needed.    [provider]  lidocaine (XYLOCAINE) 5 % ointment Apply 1 application topically as needed. 12/08/17   Kennice Finnie, Rulon Eisenmengerari B, FNP  meloxicam (MOBIC) 15 MG tablet Take 15 mg by mouth daily.    [provider]  metroNIDAZOLE (FLAGYL) 500 MG tablet Take 1 tablet (500 mg total) by mouth 2 (two) times daily. 08/31/17   Chinita Pesterriplett, Willaim Mode B, FNP  naproxen (NAPROSYN) 500 MG tablet Take 1 tablet (500 mg total) by mouth 2 (two) times daily with a meal. 08/03/16   Jene EveryKinner, Robert, MD  omeprazole (PRILOSEC) 40 MG capsule Take 40 mg by mouth daily.    [provider]  permethrin (ELIMITE) 5 % cream Apply 1 application topically once. 06/30/15   Arnaldo NatalMalinda, Paul F, MD  potassium chloride 20 MEQ TBCR Take 20 mEq by mouth daily. 08/13/16   Jeanmarie PlantMcShane, James A, MD  predniSONE (STERAPRED UNI-PAK 21 TAB) 10 MG (21) TBPK tablet Take 6 tablets on day 1 Take 5 tablets on day 2 Take 4 tablets on day 3 Take 3 tablets on day 4 Take 2 tablets on day 5 Take 1 tablet on  day 6 12/08/17   Daquawn Seelman B, FNP  pseudoephedrine (SUDAFED) 60 MG tablet Take 1 tablet (60 mg total) by mouth every 4 (four) hours as needed for congestion. 09/29/15   Beers, Charmayne Sheerharles M, PA-C  triamterene-hydrochlorothiazide (MAXZIDE-25) 37.5-25 MG tablet Take 1 tablet by mouth daily. 08/13/16   Jeanmarie PlantMcShane, James A, MD    Allergies Aspirin; Mobic [meloxicam]; Tramadol; and Chocolate  No family history on file.  Social History Social History   Tobacco Use  . Smoking status: Never Smoker  . Smokeless tobacco: Never Used  Substance Use Topics  . Alcohol use: No  . Drug use: No    Review of Systems  Constitutional: No fever/chills Eyes: No visual changes. ENT: No sore throat. Cardiovascular: Denies chest pain. Respiratory: Denies shortness of breath. Gastrointestinal: No abdominal pain.  No nausea, no vomiting.  No diarrhea.  No constipation. Genitourinary: Negative for dysuria. Musculoskeletal: Negative for back pain. Skin: Negative for rash. Neurological: Negative for headaches, focal weakness or numbness. ____________________________________________   PHYSICAL EXAM:  VITAL SIGNS: ED Triage Vitals [12/08/17 0939]  Enc Vitals Group     BP (!) 141/75     Pulse Rate 98     Resp 18     Temp 98.4 F (36.9 C)     Temp Source Oral     SpO2 98 %     Weight 205 lb (93 kg)     Height 5\' 4"  (1.626 m)     Head Circumference      Peak Flow      Pain Score 10     Pain Loc      Pain Edu?      Excl. in GC?     Constitutional: Alert and oriented. Well appearing and in no acute distress. Eyes: Conjunctivae are normal. Head: Atraumatic. Nose: No congestion/rhinnorhea. Mouth/Throat: Mucous membranes are moist.  Oropharynx non-erythematous. Airway patent. No edema of the tongue. Marked edema of lips with pinpoint vesicles and oozing clear drainage. Neck: No stridor.   Cardiovascular: Normal rate, regular rhythm. Grossly normal heart sounds.  Good peripheral  circulation. Respiratory: Normal respiratory effort.  No retractions. Lungs CTAB. Gastrointestinal: Soft and nontender. No distention. No abdominal bruits. No CVA tenderness. Musculoskeletal: No lower extremity tenderness nor edema.  No joint effusions. Neurologic:  Normal speech and language. No gross focal neurologic deficits are appreciated. No gait instability. Skin:  See exam of mouth. Psychiatric: Mood and affect are normal. Speech and behavior are normal.  ____________________________________________   LABS (all labs ordered are listed, but only abnormal results are displayed)  Labs Reviewed - No data to display ____________________________________________  EKG  Not indicated. ____________________________________________  RADIOLOGY  No results found.  ____________________________________________   PROCEDURES  Procedure(s) performed: None  Procedures  Critical Care performed: No  ____________________________________________   INITIAL IMPRESSION / ASSESSMENT AND PLAN / ED COURSE   56 year old female presenting for treatment of lip swelling. She has hypertension, but is not on an ACE or ARB. Lip swelling started during plasma donation last Wednesday. She also had what sounds to have been a seizure and was evaluated here a day later. Chart and labs reviewed from that visit. Her labs were essentially unremarkable, including her calcium. She was given SoluMedrol injection in the ER and will start a tapered dose pack. She was also given a prescription for lidocaine gel. She is to see her PCP if not getting better over the next 2 days or return to the ER for worsening symptoms. She was also strongly advised against further plasma donation.       ____________________________________________   FINAL CLINICAL IMPRESSION(S) / ED DIAGNOSES  Final diagnoses:  Angioedema of lips, initial encounter     ED Discharge Orders        Ordered    predniSONE (STERAPRED  UNI-PAK 21 TAB) 10 MG (21) TBPK tablet     12/08/17 1039    lidocaine (XYLOCAINE) 5 % ointment  As needed     12/08/17 1039       Note:  This document was prepared using Dragon voice recognition software and may include unintentional dictation errors.    Chinita Pester, FNP 12/08/17 1610    Myrna Blazer, MD 12/09/17 [phone]

## 2017-12-08 NOTE — ED Notes (Signed)
See triage note  States she thought she was having an allergic reaction last weds after donating plasma   Felt "itchy" in mouth  But noticed that her lips were starting to swell..periorbiatl swelling noted  No drooling noted

## 2017-12-18 DIAGNOSIS — G5601 Carpal tunnel syndrome, right upper limb: Secondary | ICD-10-CM | POA: Insufficient documentation

## 2017-12-18 DIAGNOSIS — K649 Unspecified hemorrhoids: Secondary | ICD-10-CM | POA: Insufficient documentation

## 2018-01-31 DIAGNOSIS — M5412 Radiculopathy, cervical region: Secondary | ICD-10-CM | POA: Insufficient documentation

## 2018-03-25 DIAGNOSIS — I10 Essential (primary) hypertension: Secondary | ICD-10-CM | POA: Insufficient documentation

## 2018-06-10 DIAGNOSIS — R51 Headache: Secondary | ICD-10-CM

## 2018-06-10 DIAGNOSIS — R519 Headache, unspecified: Secondary | ICD-10-CM | POA: Insufficient documentation

## 2018-06-21 ENCOUNTER — Other Ambulatory Visit: Payer: Self-pay | Admitting: Internal Medicine

## 2018-07-11 ENCOUNTER — Emergency Department
Admission: EM | Admit: 2018-07-11 | Discharge: 2018-07-11 | Disposition: A | Discharge status: Home / Self Care | Payer: Medicare Other | Attending: Emergency Medicine | Admitting: Emergency Medicine

## 2018-07-11 ENCOUNTER — Encounter: Payer: Self-pay | Admitting: Emergency Medicine

## 2018-07-11 ENCOUNTER — Emergency Department: Payer: Medicare Other

## 2018-07-11 DIAGNOSIS — I10 Essential (primary) hypertension: Secondary | ICD-10-CM | POA: Insufficient documentation

## 2018-07-11 DIAGNOSIS — R079 Chest pain, unspecified: Secondary | ICD-10-CM

## 2018-07-11 DIAGNOSIS — Z79899 Other long term (current) drug therapy: Secondary | ICD-10-CM | POA: Diagnosis not present

## 2018-07-11 DIAGNOSIS — F419 Anxiety disorder, unspecified: Secondary | ICD-10-CM | POA: Insufficient documentation

## 2018-07-11 LAB — BASIC METABOLIC PANEL
ANION GAP: 10 (ref 5–15)
BUN: 18 mg/dL (ref 6–20)
CALCIUM: 9.3 mg/dL (ref 8.9–10.3)
CO2: 27 mmol/L (ref 22–32)
CREATININE: 0.86 mg/dL (ref 0.44–1.00)
Chloride: 105 mmol/L (ref 98–111)
GFR calc non Af Amer: >60 mL/min (ref >60)
Glucose, Bld: 110 mg/dL — ABNORMAL HIGH (ref 70–99)
Potassium: 3 mmol/L — ABNORMAL LOW (ref 3.5–5.1)
SODIUM: 142 mmol/L (ref 135–145)

## 2018-07-11 LAB — URINALYSIS, ROUTINE W REFLEX MICROSCOPIC
BILIRUBIN URINE: NEGATIVE
GLUCOSE, UA: NEGATIVE mg/dL
Hgb urine dipstick: NEGATIVE
Ketones, ur: NEGATIVE mg/dL
LEUKOCYTES UA: NEGATIVE
NITRITE: NEGATIVE
PH: 6 (ref 5.0–8.0)
PROTEIN: NEGATIVE mg/dL
Specific Gravity, Urine: 1.018 (ref 1.005–1.030)

## 2018-07-11 LAB — CBC
HCT: 36.4 % (ref 35.0–47.0)
HEMOGLOBIN: 12.4 g/dL (ref 12.0–16.0)
MCH: 27 pg (ref 26.0–34.0)
MCHC: 33.9 g/dL (ref 32.0–36.0)
MCV: 79.6 fL — ABNORMAL LOW (ref 80.0–100.0)
PLATELETS: 250 10*3/uL (ref 150–440)
RBC: 4.57 MIL/uL (ref 3.80–5.20)
RDW: 15.5 % — ABNORMAL HIGH (ref 11.5–14.5)
WBC: 5.5 10*3/uL (ref 3.6–11.0)

## 2018-07-11 LAB — TROPONIN I

## 2018-07-11 MED ORDER — HYDROCODONE-ACETAMINOPHEN 5-325 MG PO TABS
1.0000 | ORAL_TABLET | Freq: Once | ORAL | Status: AC
  Administered 2018-07-11: 1 via ORAL
  Filled 2018-07-11: qty 1

## 2018-07-11 MED ORDER — HYDROCODONE-ACETAMINOPHEN 5-325 MG PO TABS: 1.0000 | ORAL_TABLET | ORAL | 0 refills | Status: DC | PRN

## 2018-07-11 NOTE — ED Triage Notes (Signed)
[facility] x 2 days ago. Center of chest is tender to palpation. Patient denies worsening pain with inspiration. Patient denies shortness of breath or vomiting. Endorses or nausea.

## 2018-07-11 NOTE — ED Notes (Signed)

## 2018-07-11 NOTE — ED Provider Notes (Signed)
[facility] Emergency Department Provider Note  Time seen: 2:51 PM  I have reviewed the triage vital signs and the nursing notes.   HISTORY  Chief Complaint Chest Pain    HPI Elizabeth Coffey is a 57 y.o. female with a past medical history of anxiety, arthritis, hypertension presents to the emergency department for chest pain.  According to the patient she had a routine follow-up with her doctor today.  During the follow-up the doctor pressed on her chest and the patient had a lot of discomfort so she was sent to the emergency department for evaluation.  Patient states she has been experiencing discomfort in her chest for the past several days to 1 week.  Denies any nausea, diaphoresis or shortness of breath.  No cough or congestion.  No fever.  Largely negative review of systems otherwise.  States mild discomfort currently, moderate with palpation.   Past Medical History:  Diagnosis Date  . Anxiety   . Arthritis   . Hypertension   . Peptic ulcer     There are no active problems to display for this patient.   Past Surgical History:  Procedure Laterality Date  . ABDOMINAL HYSTERECTOMY    . BREAST CYST EXCISION      Prior to Admission medications   Medication Sig Start Date End Date Taking? Authorizing Provider  albuterol-ipratropium (COMBIVENT) 18-103 MCG/ACT inhaler Inhale 1 puff into the lungs every 4 (four) hours as needed for wheezing or shortness of breath. 09/29/15   Beers, Charmayne Sheer, PA-C  amLODipine (NORVASC) 10 MG tablet Take 1 tablet (10 mg total) by mouth daily. 10/20/16 10/20/17  Rockne Menghini, MD  amLODipine-benazepril (LOTREL) 10-20 MG capsule Take 1 capsule by mouth daily. 08/13/16   Jeanmarie Plant, MD  azithromycin (ZITHROMAX Z-PAK) 250 MG tablet Take 2 tablets (500 mg) on  Day 1,  followed by 1 tablet (250 mg) once daily on Days 2 through 5. 09/29/15   Beers, Charmayne Sheer, PA-C  ciprofloxacin (CIPRO) 500 MG tablet Take 1 tablet (500 mg  total) by mouth 2 (two) times daily. 08/31/17   Triplett, Rulon Eisenmenger B, FNP  cyclobenzaprine (FLEXERIL) 10 MG tablet Take 1 tablet (10 mg total) by mouth 3 (three) times daily as needed for muscle spasms. 05/15/17   Triplett, Cari B, FNP  etodolac (LODINE) 500 MG tablet Take 500 mg by mouth 2 (two) times daily.    [provider]  FLUoxetine (PROZAC) 20 MG capsule Take 20 mg by mouth daily.    [provider]  guaiFENesin-codeine 100-10 MG/5ML syrup Take 10 mLs by mouth every 4 (four) hours as needed for cough. 09/29/15   Beers, Charmayne Sheer, PA-C  hydrOXYzine (ATARAX/VISTARIL) 10 MG tablet Take 1 tablet (10 mg total) by mouth 3 (three) times daily as needed. 06/30/15   Arnaldo Natal, MD  hydrOXYzine (ATARAX/VISTARIL) 25 MG tablet Take 25 mg by mouth 3 (three) times daily as needed.    [provider]  lidocaine (XYLOCAINE) 5 % ointment Apply 1 application topically as needed. 12/08/17   Triplett, Rulon Eisenmenger B, FNP  meloxicam (MOBIC) 15 MG tablet Take 15 mg by mouth daily.    [provider]  metroNIDAZOLE (FLAGYL) 500 MG tablet Take 1 tablet (500 mg total) by mouth 2 (two) times daily. 08/31/17   Triplett, Cari B, FNP  naproxen (NAPROSYN) 500 MG tablet Take 1 tablet (500 mg total) by mouth 2 (two) times daily with a meal. 08/03/16   Jene Every, MD  omeprazole (PRILOSEC) 40 MG capsule Take 40 mg by mouth daily.    [provider]  permethrin (ELIMITE) 5 % cream Apply 1 application topically once. 06/30/15   Arnaldo Natal, MD  potassium chloride 20 MEQ TBCR Take 20 mEq by mouth daily. 08/13/16   Jeanmarie Plant, MD  predniSONE (STERAPRED UNI-PAK 21 TAB) 10 MG (21) TBPK tablet Take 6 tablets on day 1 Take 5 tablets on day 2 Take 4 tablets on day 3 Take 3 tablets on day 4 Take 2 tablets on day 5 Take 1 tablet on day 6 12/08/17   Triplett, Cari B, FNP  pseudoephedrine (SUDAFED) 60 MG tablet Take 1 tablet (60 mg total) by mouth every 4 (four) hours as needed for congestion.  09/29/15   Beers, Charmayne Sheer, PA-C  triamterene-hydrochlorothiazide (MAXZIDE-25) 37.5-25 MG tablet Take 1 tablet by mouth daily. 08/13/16   Jeanmarie Plant, MD  albuterol (PROVENTIL HFA;VENTOLIN HFA) 108 (90 BASE) MCG/ACT inhaler Inhale 2 puffs into the lungs every 6 (six) hours as needed for wheezing or shortness of breath. 09/07/15 09/29/15  Governor Rooks, MD    Allergies  Allergen Reactions  . Aspirin Itching  . Mobic [Meloxicam] Other (See Comments)    Light headed and vomiting.   . Tramadol Other (See Comments)    Stomach pain   . Chocolate Itching and Rash    No family history on file.  Social History Social History   Tobacco Use  . Smoking status: Never Smoker  . Smokeless tobacco: Never Used  Substance Use Topics  . Alcohol use: No  . Drug use: No    Review of Systems Constitutional: Negative for fever. Eyes: Negative for visual complaints ENT: Negative for recent illness/congestion Cardiovascular: Central chest pain for the past several days to 1 week.  Worse with palpation. Respiratory: Negative for shortness of breath.  Negative for cough. Gastrointestinal: Negative for abdominal pain, vomiting  Genitourinary: Mild dysuria x2 to 3 days. Musculoskeletal: Negative for leg pain or swelling. Skin: Negative for skin complaints  Neurological: Negative for headache All other ROS negative  ____________________________________________   PHYSICAL EXAM:  VITAL SIGNS: ED Triage Vitals [07/11/18 1246]  Enc Vitals Group     BP (!) 178/83     Pulse Rate (!) 58     Resp 18     Temp 97.8 F (36.6 C)     Temp Source Oral     SpO2 97 %     Weight 205 lb (93 kg)     Height 5\' 4"  (1.626 m)     Head Circumference      Peak Flow      Pain Score      Pain Loc      Pain Edu?      Excl. in GC?    Constitutional: Alert and oriented. Well appearing and in no distress. Eyes: Normal exam ENT   Head: Normocephalic and atraumatic.   Mouth/Throat: Mucous membranes are  moist. Cardiovascular: Normal rate, regular rhythm. No murmur Respiratory: Normal respiratory effort without tachypnea nor retractions. Breath sounds are clear.  Moderate central chest tenderness to palpation. Gastrointestinal: Soft and nontender. No distention.  Musculoskeletal: Nontender with normal range of motion in all extremities.  Neurologic:  Normal speech and language. No gross focal neurologic deficits Skin:  Skin is warm, dry and intact.  Psychiatric: Mood and affect are normal.   ____________________________________________    EKG  EKG reviewed and interpreted by myself shows sinus bradycardia 59  bpm with a narrow QRS, normal axis, normal intervals, no concerning ST changes.  ____________________________________________    RADIOLOGY  X-ray negative  ____________________________________________   INITIAL IMPRESSION / ASSESSMENT AND PLAN / ED COURSE  Pertinent labs & imaging results that were available during my care of the patient were reviewed by me and considered in my medical decision making (see chart for details).  Patient presents to the emergency department from her PCP for evaluation of chest pain ongoing for up to 1 week.  Patient has extremely reproducible central chest pain.  Differential would include muscular skeletal pain, arthritis, costochondritis, ACS, pneumothorax.  Patient's work-up including labs are normal.  Troponin negative.  Chest x-ray is negative.  We will repeat a troponin and dose pain medication.  If the patient's repeat troponin remains negative we will discharge with a short course of pain medication and PCP follow-up.  As a side note patient did note mild dysuria over the past several days.  We will check a urinalysis as a precaution.  Since repeat troponin remains negative.  Urinalysis is normal.  We will discharge the patient home with PCP follow-up and short course of pain  medication.  ____________________________________________   FINAL CLINICAL IMPRESSION(S) / ED DIAGNOSES  Chest pain    Minna AntisPaduchowski, Sparrow Sanzo, MD 07/11/18 1640

## 2018-08-09 ENCOUNTER — Other Ambulatory Visit: Payer: Self-pay | Admitting: Internal Medicine

## 2018-08-09 DIAGNOSIS — N6320 Unspecified lump in the left breast, unspecified quadrant: Secondary | ICD-10-CM

## 2018-08-22 ENCOUNTER — Other Ambulatory Visit: Payer: Medicare Other

## 2018-08-24 DIAGNOSIS — G479 Sleep disorder, unspecified: Secondary | ICD-10-CM | POA: Insufficient documentation

## 2018-08-24 DIAGNOSIS — R202 Paresthesia of skin: Secondary | ICD-10-CM | POA: Insufficient documentation

## 2018-08-24 DIAGNOSIS — R200 Anesthesia of skin: Secondary | ICD-10-CM | POA: Insufficient documentation

## 2018-09-03 IMAGING — CR DG CHEST 2V
1 series · 2 of 2 positions shown · non-contrast
Comparison: 10/20/2016

CLINICAL DATA: Short of breath

EXAM:
CHEST  2 VIEW

[Series 1: dg chest 2 view · 0.14mm/px · 2 of 2 slices shown]
[im 1/2]
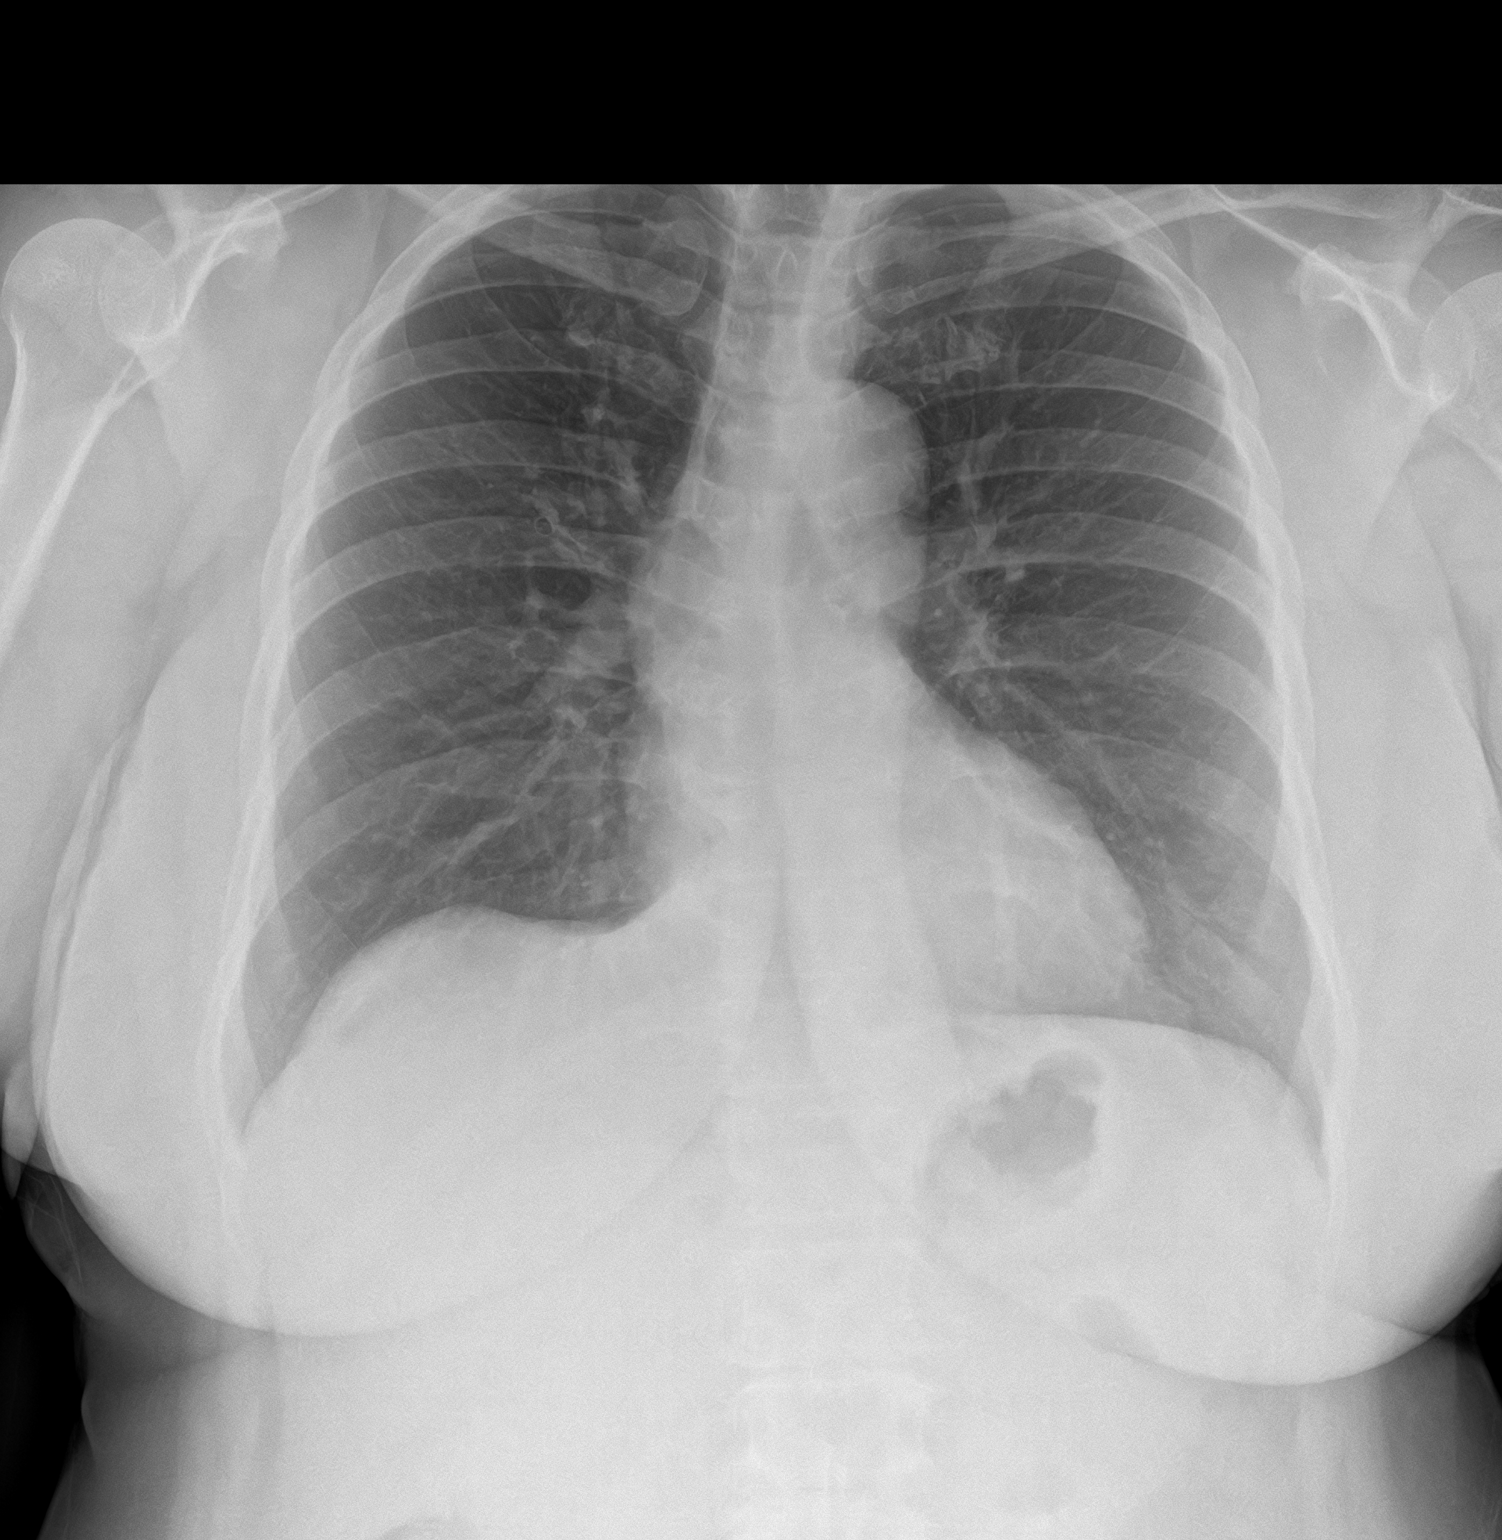
[im 2/2]
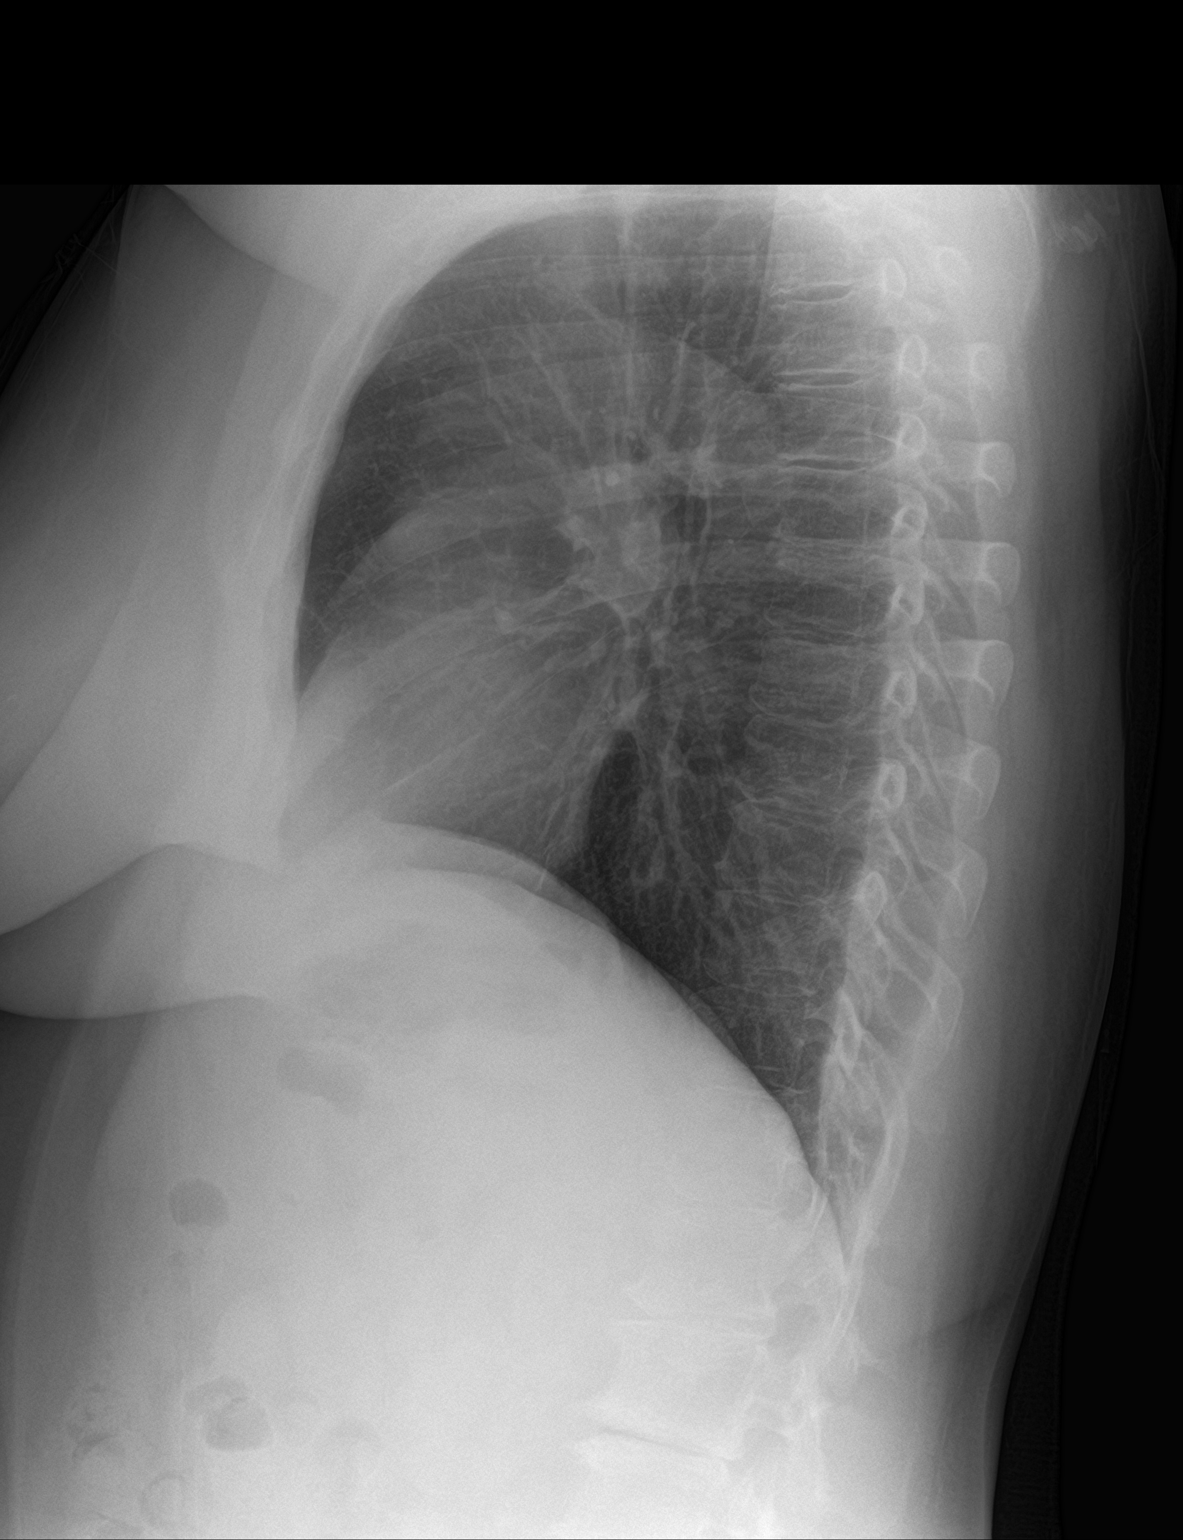

[2 of 2 positions shown; findings below may reference images not displayed]

FINDINGS: The heart size and mediastinal contours are within normal limits.
Both lungs are clear. The visualized skeletal structures are
unremarkable.
IMPRESSION: No active cardiopulmonary disease.

## 2018-09-09 ENCOUNTER — Inpatient Hospital Stay: Admission: RE | Admit: 2018-09-09 | Payer: Medicare Other | Source: Ambulatory Visit

## 2018-09-09 ENCOUNTER — Other Ambulatory Visit: Payer: Medicare Other

## 2018-09-27 ENCOUNTER — Inpatient Hospital Stay: Admission: RE | Admit: 2018-09-27 | Payer: Medicare Other | Source: Ambulatory Visit

## 2018-09-27 ENCOUNTER — Other Ambulatory Visit: Payer: Medicare Other

## 2018-10-26 ENCOUNTER — Other Ambulatory Visit: Payer: Self-pay

## 2018-10-26 ENCOUNTER — Encounter: Payer: Self-pay | Admitting: Psychiatry

## 2018-10-26 ENCOUNTER — Ambulatory Visit (INDEPENDENT_AMBULATORY_CARE_PROVIDER_SITE_OTHER): Payer: Medicare Other | Admitting: Psychiatry

## 2018-10-26 VITALS — BP 167/97 | HR 75 | Temp 97.8°F | Wt 195.4 lb

## 2018-10-26 DIAGNOSIS — F5105 Insomnia due to other mental disorder: Secondary | ICD-10-CM | POA: Diagnosis not present

## 2018-10-26 DIAGNOSIS — F333 Major depressive disorder, recurrent, severe with psychotic symptoms: Secondary | ICD-10-CM | POA: Diagnosis not present

## 2018-10-26 DIAGNOSIS — F411 Generalized anxiety disorder: Secondary | ICD-10-CM | POA: Diagnosis not present

## 2018-10-26 MED ORDER — QUETIAPINE FUMARATE 25 MG PO TABS: 25.0000 mg | ORAL_TABLET | Freq: Every day | ORAL | 0 refills | Status: DC

## 2018-10-26 MED ORDER — DULOXETINE HCL 30 MG PO CPEP: 30.0000 mg | ORAL_CAPSULE | Freq: Every day | ORAL | 0 refills | Status: DC

## 2018-10-26 MED ORDER — NORTRIPTYLINE HCL 10 MG PO CAPS: 20.0000 mg | ORAL_CAPSULE | Freq: Every day | ORAL | 0 refills | Status: DC
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
## 2018-10-26 NOTE — Patient Instructions (Addendum)
Quetiapine tablets What is this medicine? QUETIAPINE (kwe TYE a peen) is an antipsychotic. It is used to treat schizophrenia and bipolar disorder, also known as manic-depression. This medicine may be used for other purposes; ask your health care provider or pharmacist if you have questions. COMMON BRAND NAME(S): Seroquel What should I tell my health care provider before I take this medicine? They need to know if you have any of these conditions: -brain tumor or head injury -breast cancer -cataracts -diabetes -difficulty swallowing -heart disease -kidney disease -liver disease -low blood counts, like low white cell, platelet, or red cell counts -low blood pressure or dizziness when standing up -Parkinson's disease -previous heart attack -seizures -suicidal thoughts, plans, or attempt by you or a family member -thyroid disease -an unusual or allergic reaction to quetiapine, other medicines, foods, dyes, or preservatives -pregnant or trying to get pregnant -breast-feeding How should I use this medicine? Take this medicine by mouth. Swallow it with a drink of water. Follow the directions on the prescription label. If it upsets your stomach you can take it with food. Take your medicine at regular intervals. Do not take it more often than directed. Do not stop taking except on the advice of your doctor or health care professional. A special MedGuide will be given to you by the pharmacist with each prescription and refill. Be sure to read this information carefully each time. Talk to your pediatrician regarding the use of this medicine in children. While this drug may be prescribed for children as young as 10 years for selected conditions, precautions do apply. Patients over age 75 years may have a stronger reaction to this medicine and need smaller doses. Overdosage: If you think you have taken too much of this medicine contact a poison control center or emergency room at  once. NOTE: This medicine is only for you. Do not share this medicine with others. What if I miss a dose? If you miss a dose, take it as soon as you can. If it is almost time for your next dose, take only that dose. Do not take double or extra doses. What may interact with this medicine? Do not take this medicine with any of the following medications: -certain medicines for fungal infections like fluconazole, itraconazole, ketoconazole, posaconazole, voriconazole -cisapride -dofetilide -dronedarone -droperidol -grepafloxacin -halofantrine -phenothiazines like chlorpromazine, mesoridazine, thioridazine -pimozide -sparfloxacin -ziprasidone This medicine may also interact with the following medications: -alcohol -antiviral medicines for HIV or AIDS -certain medicines for blood pressure -certain medicines for depression, anxiety, or psychotic disturbances like haloperidol, lorazepam -certain medicines for diabetes -certain medicines for Parkinson's disease -certain medicines for seizures like carbamazepine, phenobarbital, phenytoin -cimetidine -erythromycin -other medicines that prolong the QT interval (cause an abnormal heart rhythm) -rifampin -steroid medicines like prednisone or cortisone This list may not describe all possible interactions. Give your health care provider a list of all the medicines, herbs, non-prescription drugs, or dietary supplements you use. Also tell them if you smoke, drink alcohol, or use illegal drugs. Some items may interact with your medicine. What should I watch for while using this medicine? Visit your doctor or health care professional for regular checks on your progress. It may be several weeks before you see the full effects of this medicine. Your health care provider may suggest that you have your eyes examined prior to starting this medicine, and every 6 months thereafter. If you have been taking this medicine regularly for some time, do not suddenly  stop taking  it. You must gradually reduce the dose or your symptoms may get worse. Ask your doctor or health care professional for advice. Patients and their families should watch out for worsening depression or thoughts of suicide. Also watch out for sudden or severe changes in feelings such as feeling anxious, agitated, panicky, irritable, hostile, aggressive, impulsive, severely restless, overly excited and hyperactive, or not being able to sleep. If this happens, especially at the beginning of antidepressant treatment or after a change in dose, call your health care professional. Elizabeth Coffey may get dizzy or drowsy. Do not drive, use machinery, or do anything that needs mental alertness until you know how this medicine affects you. Do not stand or sit up quickly, especially if you are an older patient. This reduces the risk of dizzy or fainting spells. Alcohol can increase dizziness and drowsiness. Avoid alcoholic drinks. Do not treat yourself for colds, diarrhea or allergies. Ask your doctor or health care professional for advice, some ingredients may increase possible side effects. This medicine can reduce the response of your body to heat or cold. Dress warm in cold weather and stay hydrated in hot weather. If possible, avoid extreme temperatures like saunas, hot tubs, very hot or cold showers, or activities that can cause dehydration such as vigorous exercise. What side effects may I notice from receiving this medicine? Side effects that you should report to your doctor or health care professional as soon as possible: -allergic reactions like skin rash, itching or hives, swelling of the face, lips, or tongue -difficulty swallowing -fast or irregular heartbeat -fever or chills, sore throat -fever with rash, swollen lymph nodes, or swelling of the face -increased hunger or thirst -increased urination -problems with balance, talking, walking -seizures -stiff muscles -suicidal thoughts or other mood  changes -uncontrollable head, mouth, neck, arm, or leg movements -unusually weak or tired Side effects that usually do not require medical attention (report to your doctor or health care professional if they continue or are bothersome): -change in sex drive or performance -constipation -drowsy or dizzy -dry mouth -stomach upset -weight gain This list may not describe all possible side effects. Call your doctor for medical advice about side effects. You may report side effects to FDA at 1-800-FDA-1088. Where should I keep my medicine? Keep out of the reach of children. Store at room temperature between 15 and 30 degrees C (59 and 86 degrees F). Throw away any unused medicine after the expiration date. NOTE: This sheet is a summary. It may not cover all possible information. If you have questions about this medicine, talk to your doctor, pharmacist, or health care provider.  2018 Elsevier/Gold Standard (2015-06-18 13:07:35) Duloxetine delayed-release capsules What is this medicine? DULOXETINE (doo LOX e teen) is used to treat depression, anxiety, and different types of chronic pain. This medicine may be used for other purposes; ask your health care provider or pharmacist if you have questions. COMMON BRAND NAME(S): Cymbalta, [place]ren[place] What should I tell my health care provider before I take this medicine? They need to know if you have any of these conditions: -bipolar disorder or a family history of bipolar disorder -glaucoma -kidney disease -liver disease -suicidal thoughts or a previous suicide attempt -taken medicines called MAOIs like Carbex, Eldepryl, Marplan, Nardil, and Parnate within 14 days -an unusual reaction to duloxetine, other medicines, foods, dyes, or preservatives -pregnant or trying to get pregnant -breast-feeding How should I use this medicine? Take this medicine by mouth with a glass of water. Follow the directions on  the prescription label. Do not cut, crush or chew  this medicine. You can take this medicine with or without food. Take your medicine at regular intervals. Do not take your medicine more often than directed. Do not stop taking this medicine suddenly except upon the advice of your doctor. Stopping this medicine too quickly may cause serious side effects or your condition may worsen. A special MedGuide will be given to you by the pharmacist with each prescription and refill. Be sure to read this information carefully each time. Talk to your pediatrician regarding the use of this medicine in children. While this drug may be prescribed for children as young as 29 years of age for selected conditions, precautions do apply. Overdosage: If you think you have taken too much of this medicine contact a poison control center or emergency room at once. NOTE: This medicine is only for you. Do not share this medicine with others. What if I miss a dose? If you miss a dose, take it as soon as you can. If it is almost time for your next dose, take only that dose. Do not take double or extra doses. What may interact with this medicine? Do not take this medicine with any of the following medications: -desvenlafaxine -levomilnacipran -linezolid -MAOIs like Carbex, Eldepryl, Marplan, Nardil, and Parnate -methylene blue (injected into a vein) -milnacipran -thioridazine -venlafaxine This medicine may also interact with the following medications: -alcohol -amphetamines -aspirin and aspirin-like medicines -certain antibiotics like ciprofloxacin and enoxacin -certain medicines for blood pressure, heart disease, irregular heart beat -certain medicines for depression, anxiety, or psychotic disturbances -certain medicines for migraine headache like almotriptan, eletriptan, frovatriptan, naratriptan, rizatriptan, sumatriptan, zolmitriptan -certain medicines that treat or prevent blood clots like warfarin, enoxaparin, and  dalteparin -cimetidine -fentanyl -lithium -NSAIDS, medicines for pain and inflammation, like ibuprofen or naproxen -phentermine -procarbazine -rasagiline -sibutramine -St. John's wort -theophylline -tramadol -tryptophan This list may not describe all possible interactions. Give your health care provider a list of all the medicines, herbs, non-prescription drugs, or dietary supplements you use. Also tell them if you smoke, drink alcohol, or use illegal drugs. Some items may interact with your medicine. What should I watch for while using this medicine? Tell your doctor if your symptoms do not get better or if they get worse. Visit your doctor or health care professional for regular checks on your progress. Because it may take several weeks to see the full effects of this medicine, it is important to continue your treatment as prescribed by your doctor. Patients and their families should watch out for new or worsening thoughts of suicide or depression. Also watch out for sudden changes in feelings such as feeling anxious, agitated, panicky, irritable, hostile, aggressive, impulsive, severely restless, overly excited and hyperactive, or not being able to sleep. If this happens, especially at the beginning of treatment or after a change in dose, call your health care professional. Elizabeth Coffey may get drowsy or dizzy. Do not drive, use machinery, or do anything that needs mental alertness until you know how this medicine affects you. Do not stand or sit up quickly, especially if you are an older patient. This reduces the risk of dizzy or fainting spells. Alcohol may interfere with the effect of this medicine. Avoid alcoholic drinks. This medicine can cause an increase in blood pressure. This medicine can also cause a sudden drop in your blood pressure, which may make you feel faint and increase the chance of a fall. These effects are most common when you  first start the medicine or when the dose is increased,  or during use of other medicines that can cause a sudden drop in blood pressure. Check with your doctor for instructions on monitoring your blood pressure while taking this medicine. Your mouth may get dry. Chewing sugarless gum or sucking hard candy, and drinking plenty of water may help. Contact your doctor if the problem does not go away or is severe. What side effects may I notice from receiving this medicine? Side effects that you should report to your doctor or health care professional as soon as possible: -allergic reactions like skin rash, itching or hives, swelling of the face, lips, or tongue -anxious -breathing problems -confusion -changes in vision -chest pain -confusion -elevated mood, decreased need for sleep, racing thoughts, impulsive behavior -eye pain -fast, irregular heartbeat -feeling faint or lightheaded, falls -feeling agitated, angry, or irritable -hallucination, loss of contact with reality -high blood pressure -loss of balance or coordination -palpitations -redness, blistering, peeling or loosening of the skin, including inside the mouth -restlessness, pacing, inability to keep still -seizures -stiff muscles -suicidal thoughts or other mood changes -trouble passing urine or change in the amount of urine -trouble sleeping -unusual bleeding or bruising -unusually weak or tired -vomiting -yellowing of the eyes or skin Side effects that usually do not require medical attention (report to your doctor or health care professional if they continue or are bothersome): -change in sex drive or performance -change in appetite or weight -constipation -dizziness -dry mouth -headache -increased sweating -nausea -tired This list may not describe all possible side effects. Call your doctor for medical advice about side effects. You may report side effects to FDA at 1-800-FDA-1088. Where should I keep my medicine? Keep out of the reach of children. Store at room  temperature between 20 and 25 degrees C (68 to 77 degrees F). Throw away any unused medicine after the expiration date. NOTE: This sheet is a summary. It may not cover all possible information. If you have questions about this medicine, talk to your doctor, pharmacist, or health care provider.  2018 Elsevier/Gold Standard (2016-05-14 18:16:03)  PLEASE STOP ESCITALOPRAM , START TAKING DULOXETINE AND SEROQUEL.

## 2018-11-02 ENCOUNTER — Inpatient Hospital Stay: Admission: RE | Admit: 2018-11-02 | Payer: Medicare Other | Source: Ambulatory Visit

## 2018-11-02 ENCOUNTER — Other Ambulatory Visit: Payer: Medicare Other

## 2018-11-03 ENCOUNTER — Other Ambulatory Visit: Payer: Medicare Other

## 2018-11-03 ENCOUNTER — Ambulatory Visit: Admission: RE | Admit: 2018-11-03 | Payer: Medicare Other | Source: Ambulatory Visit

## 2018-11-03 ENCOUNTER — Inpatient Hospital Stay: Admission: RE | Admit: 2018-11-03 | Payer: Medicare Other | Source: Ambulatory Visit

## 2018-11-09 ENCOUNTER — Ambulatory Visit: Payer: Medicare Other | Admitting: Psychiatry

## 2018-11-16 ENCOUNTER — Ambulatory Visit: Payer: Medicare Other | Admitting: Licensed Clinical Social Worker

## 2018-11-17 ENCOUNTER — Other Ambulatory Visit: Payer: Self-pay | Admitting: Psychiatry

## 2018-11-17 DIAGNOSIS — F411 Generalized anxiety disorder: Secondary | ICD-10-CM

## 2018-11-17 DIAGNOSIS — F333 Major depressive disorder, recurrent, severe with psychotic symptoms: Secondary | ICD-10-CM

## 2018-11-21 ENCOUNTER — Ambulatory Visit: Payer: Medicare Other | Admitting: Psychiatry

## 2018-11-22 ENCOUNTER — Other Ambulatory Visit: Payer: Medicare Other

## 2019-03-07 ENCOUNTER — Ambulatory Visit: Payer: Medicare Other | Admitting: Licensed Clinical Social Worker

## 2019-04-19 ENCOUNTER — Encounter: Admission: RE | Payer: Self-pay | Source: Home / Self Care

## 2019-04-19 ENCOUNTER — Ambulatory Visit
Admission: RE | Admit: 2019-04-19 | Payer: Medicare Other | Source: Home / Self Care | Admitting: Unknown Physician Specialty

## 2019-04-19 SURGERY — ESOPHAGOGASTRODUODENOSCOPY (EGD) WITH PROPOFOL
Anesthesia: General

## 2019-05-24 ENCOUNTER — Ambulatory Visit: Payer: Medicare Other | Attending: Neurology

## 2019-05-29 ENCOUNTER — Ambulatory Visit: Payer: Medicare Other

## 2019-08-20 ENCOUNTER — Other Ambulatory Visit: Payer: Self-pay | Admitting: Psychiatry

## 2019-08-20 DIAGNOSIS — F333 Major depressive disorder, recurrent, severe with psychotic symptoms: Secondary | ICD-10-CM

## 2019-08-20 DIAGNOSIS — F411 Generalized anxiety disorder: Secondary | ICD-10-CM

## 2019-09-13 ENCOUNTER — Emergency Department: Payer: Medicare Other

## 2019-09-13 ENCOUNTER — Other Ambulatory Visit: Payer: Self-pay

## 2019-09-13 ENCOUNTER — Encounter: Payer: Self-pay | Admitting: Emergency Medicine

## 2019-09-13 ENCOUNTER — Emergency Department
Admission: EM | Admit: 2019-09-13 | Discharge: 2019-09-13 | Disposition: A | Discharge status: Home / Self Care | Payer: Medicare Other | Attending: Emergency Medicine | Admitting: Emergency Medicine

## 2019-09-13 DIAGNOSIS — R05 Cough: Secondary | ICD-10-CM | POA: Diagnosis not present

## 2019-09-13 DIAGNOSIS — Z79899 Other long term (current) drug therapy: Secondary | ICD-10-CM | POA: Diagnosis not present

## 2019-09-13 DIAGNOSIS — M791 Myalgia, unspecified site: Secondary | ICD-10-CM | POA: Diagnosis not present

## 2019-09-13 DIAGNOSIS — Z7982 Long term (current) use of aspirin: Secondary | ICD-10-CM | POA: Diagnosis not present

## 2019-09-13 DIAGNOSIS — Z20828 Contact with and (suspected) exposure to other viral communicable diseases: Secondary | ICD-10-CM | POA: Diagnosis not present

## 2019-09-13 DIAGNOSIS — E871 Hypo-osmolality and hyponatremia: Secondary | ICD-10-CM

## 2019-09-13 DIAGNOSIS — I10 Essential (primary) hypertension: Secondary | ICD-10-CM | POA: Diagnosis not present

## 2019-09-13 DIAGNOSIS — Z20822 Contact with and (suspected) exposure to covid-19: Secondary | ICD-10-CM

## 2019-09-13 DIAGNOSIS — R059 Cough, unspecified: Secondary | ICD-10-CM

## 2019-09-13 DIAGNOSIS — R51 Headache: Secondary | ICD-10-CM | POA: Diagnosis present

## 2019-09-13 LAB — URINALYSIS, COMPLETE (UACMP) WITH MICROSCOPIC
Bilirubin Urine: NEGATIVE
Glucose, UA: NEGATIVE mg/dL
Hgb urine dipstick: NEGATIVE
Ketones, ur: NEGATIVE mg/dL
Leukocytes,Ua: NEGATIVE
Nitrite: NEGATIVE
Protein, ur: 100 mg/dL — AB
Specific Gravity, Urine: 1.016 (ref 1.005–1.030)
pH: 6 (ref 5.0–8.0)

## 2019-09-13 LAB — CBC
HCT: 43.2 % (ref 36.0–46.0)
Hemoglobin: 14.1 g/dL (ref 12.0–15.0)
MCH: 26.2 pg (ref 26.0–34.0)
MCHC: 32.6 g/dL (ref 30.0–36.0)
MCV: 80.3 fL (ref 80.0–100.0)
Platelets: 292 10*3/uL (ref 150–400)
RBC: 5.38 MIL/uL — ABNORMAL HIGH (ref 3.87–5.11)
RDW: 14 % (ref 11.5–15.5)
WBC: 6.7 10*3/uL (ref 4.0–10.5)
nRBC: 0 % (ref 0.0–0.2)

## 2019-09-13 LAB — LIPASE, BLOOD: Lipase: 33 U/L (ref 11–51)

## 2019-09-13 LAB — COMPREHENSIVE METABOLIC PANEL
ALT: 16 U/L (ref 0–44)
AST: 21 U/L (ref 15–41)
Albumin: 4.4 g/dL (ref 3.5–5.0)
Alkaline Phosphatase: 91 U/L (ref 38–126)
Anion gap: 11 (ref 5–15)
BUN: 16 mg/dL (ref 6–20)
CO2: 26 mmol/L (ref 22–32)
Calcium: 9.7 mg/dL (ref 8.9–10.3)
Chloride: 106 mmol/L (ref 98–111)
Creatinine, Ser: 0.89 mg/dL (ref 0.44–1.00)
GFR calc Af Amer: >60 mL/min (ref >60)
GFR calc non Af Amer: >60 mL/min (ref >60)
Glucose, Bld: 129 mg/dL — ABNORMAL HIGH (ref 70–99)
Potassium: 2.9 mmol/L — ABNORMAL LOW (ref 3.5–5.1)
Sodium: 143 mmol/L (ref 135–145)
Total Bilirubin: 0.6 mg/dL (ref 0.3–1.2)
Total Protein: 8.2 g/dL — ABNORMAL HIGH (ref 6.5–8.1)

## 2019-09-13 LAB — MAGNESIUM: Magnesium: 2.1 mg/dL (ref 1.7–2.4)

## 2019-09-13 MED ORDER — POTASSIUM CHLORIDE 20 MEQ PO PACK
40.0000 meq | PACK | Freq: Once | ORAL | Status: AC
  Administered 2019-09-13: 40 meq via ORAL
  Filled 2019-09-13: qty 2

## 2019-09-13 MED ORDER — MORPHINE SULFATE (PF) 4 MG/ML IV SOLN
4.0000 mg | Freq: Once | INTRAVENOUS | Status: AC
  Administered 2019-09-13: 4 mg via INTRAVENOUS
  Filled 2019-09-13: qty 1

## 2019-09-13 MED ORDER — POTASSIUM CHLORIDE 10 MEQ/100ML IV SOLN
10.0000 meq | Freq: Once | INTRAVENOUS | Status: AC
  Administered 2019-09-13: 10 meq via INTRAVENOUS
  Filled 2019-09-13 (×2): qty 100

## 2019-09-13 MED ORDER — MAGNESIUM SULFATE 2 GM/50ML IV SOLN
2.0000 g | Freq: Once | INTRAVENOUS | Status: AC
  Administered 2019-09-13: 2 g via INTRAVENOUS
  Filled 2019-09-13: qty 50

## 2019-09-13 MED ORDER — CLONIDINE HCL 0.1 MG PO TABS
0.2000 mg | ORAL_TABLET | Freq: Once | ORAL | Status: AC
  Administered 2019-09-13: 0.2 mg via ORAL
  Filled 2019-09-13: qty 2

## 2019-09-13 MED ORDER — LACTATED RINGERS IV BOLUS
1000.0000 mL | Freq: Once | INTRAVENOUS | Status: AC
  Administered 2019-09-13: 1000 mL via INTRAVENOUS

## 2019-09-13 NOTE — ED Notes (Signed)
Pharmacy called Corene Cornea) regarding missing dose./ St he will send it up

## 2019-09-13 NOTE — ED Notes (Signed)
Pt given crackers and peanut butter. Lights dimmed, falls bracelet applied and TV on. Pt resting in NAD and verbalized that she is aware of call bell and to call if staff is needed.

## 2019-09-13 NOTE — ED Triage Notes (Signed)
Pt reports has a HA, abd pain, cough and dizziness. Pt states sx's have been going on for the past week. Pt denies SOB or productive cough.

## 2019-09-13 NOTE — ED Notes (Signed)
MD at bedside. 

## 2019-09-13 NOTE — ED Notes (Signed)
IV infiltrated on RT FA , IV removed and ice pack applied to site.

## 2019-09-13 NOTE — ED Provider Notes (Signed)
[facility] Emergency Department Provider Note   ____________________________________________   First MD Initiated Contact with Patient 09/13/19 1406     (approximate)  I have reviewed the triage vital signs and the nursing notes.   HISTORY  Chief Complaint Headache, Abdominal Pain, Cough, and Dizziness    HPI Elizabeth Coffey is a 58 y.o. female with past medical history of hypertension who presents to the ED complaining of cough, lightheadedness, and diffuse muscle aches.  Patient reports that she has been feeling bad in general for the past couple of days with generalized weakness, fatigue, muscle aches, and cough.  She denies any specific pain in her chest or abdomen, states she feels achy all over.  She describes some mild shortness of breath, has not noticed any pain or swelling in her lower extremities.  She reports subjective fevers and chills, states that a neighbor had tested positive for COVID-19 and patient's daughter-in-law, who lives with her, had been visiting the neighbor and has similar symptoms.  She denies any nausea, vomiting, or diarrhea.        Past Medical History:  Diagnosis Date  . Anxiety   . Arthritis   . Hypertension   . Peptic ulcer     Patient Active Problem List   Diagnosis Date Noted  . Numbness and tingling of left arm and leg 08/24/2018  . Sleep disturbance 08/24/2018  . Chest pain at rest 07/11/2018  . Headache disorder 06/10/2018  . Poorly-controlled hypertension 03/25/2018  . Cervical radiculopathy 01/31/2018  . Hemorrhoids 12/18/2017  . Mild carpal tunnel syndrome of right wrist 12/18/2017  . Depression with anxiety 09/27/2017  . Cervical muscle strain 09/23/2016  . Degenerative disc disease, lumbar 09/23/2016  . Essential hypertension 09/23/2016  . Gastroesophageal reflux disease without esophagitis 09/23/2016  . Abnormal mammogram of left breast 01/06/2016    Past Surgical History:  Procedure Laterality  Date  . ABDOMINAL HYSTERECTOMY    . BREAST CYST EXCISION      Prior to Admission medications   Medication Sig Start Date End Date Taking? Authorizing Provider  albuterol-ipratropium (COMBIVENT) 18-103 MCG/ACT inhaler Inhale 1 puff into the lungs every 4 (four) hours as needed for wheezing or shortness of breath. 09/29/15   Beers, Charmayne Sheer, PA-C  amLODipine-benazepril (LOTREL) 10-20 MG capsule Take 1 capsule by mouth daily. 08/13/16   Jeanmarie Plant, MD  aspirin EC 81 MG tablet Take by mouth.    [provider]  cloNIDine (CATAPRES) 0.2 MG tablet TAKE 1 TABLET (0.2 MG TOTAL) BY MOUTH 2 (TWO) TIMES DAILY 08/08/18   [provider]  cyclobenzaprine (FLEXERIL) 10 MG tablet Take 1 tablet (10 mg total) by mouth 3 (three) times daily as needed for muscle spasms. 05/15/17   Triplett, Cari B, FNP  DULoxetine (CYMBALTA) 30 MG capsule TAKE 1 CAPSULE (30 MG TOTAL) BY MOUTH DAILY. FOR MOOD 11/18/18   Jomarie Longs, MD  etodolac (LODINE) 500 MG tablet Take 500 mg by mouth 2 (two) times daily.    [provider]  guaiFENesin-codeine 100-10 MG/5ML syrup Take 10 mLs by mouth every 4 (four) hours as needed for cough. 09/29/15   Beers, Charmayne Sheer, PA-C  hydrochlorothiazide (HYDRODIURIL) 25 MG tablet Take by mouth. 06/10/18 06/10/19  [provider]  HYDROcodone-acetaminophen (NORCO/VICODIN) 5-325 MG tablet Take 1 tablet by mouth every 4 (four) hours as needed. 07/11/18   Minna Antis, MD  lidocaine (XYLOCAINE) 5 % ointment Apply 1 application topically as needed. 12/08/17  Triplett, Cari B, FNP  linaclotide (LINZESS) 290 MCG CAPS capsule Take by mouth.    [provider]  meloxicam (MOBIC) 15 MG tablet Take 15 mg by mouth daily.    [provider]  metoprolol succinate (TOPROL-XL) 50 MG 24 hr tablet TAKE 1 AND 1/2 TABLETS (75 MG TOTAL) BY MOUTH ONCE DAILY 06/14/18   [provider]  metroNIDAZOLE (FLAGYL) 500 MG tablet Take 1 tablet (500 mg total) by  mouth 2 (two) times daily. 08/31/17   Triplett, Johnette Abraham B, FNP  naproxen (NAPROSYN) 500 MG tablet Take 1 tablet (500 mg total) by mouth 2 (two) times daily with a meal. 08/03/16   Lavonia Drafts, MD  nortriptyline (PAMELOR) 10 MG capsule Take 2 capsules (20 mg total) by mouth at bedtime. 10/26/18   Ursula Alert, MD  omeprazole (PRILOSEC) 40 MG capsule Take 40 mg by mouth daily.    [provider]  pseudoephedrine (SUDAFED) 60 MG tablet Take 1 tablet (60 mg total) by mouth every 4 (four) hours as needed for congestion. 09/29/15   Beers, Pierce Crane, PA-C  QUEtiapine (SEROQUEL) 25 MG tablet TAKE 1 TABLET (25 MG TOTAL) BY MOUTH AT BEDTIME. FOR MOOD AND SLEEP 11/18/18   Ursula Alert, MD  ranitidine (ZANTAC) 150 MG capsule Take by mouth. 08/08/18 08/08/19  [provider]  tiZANidine (ZANAFLEX) 2 MG tablet 1-2 tablets at bedtime as needed for muscle spasm. 08/08/18   [provider]  triamterene-hydrochlorothiazide (MAXZIDE-25) 37.5-25 MG tablet Take 1 tablet by mouth daily. 08/13/16   Schuyler Amor, MD    Allergies Hydrocodone-acetaminophen, Aspirin, Mobic [meloxicam], Tramadol, Chocolate, and Cocoa  Family History  Problem Relation Age of Onset  . Mental illness Maternal Uncle     Social History Social History   Tobacco Use  . Smoking status: Never Smoker  . Smokeless tobacco: Never Used  Substance Use Topics  . Alcohol use: No  . Drug use: No    Review of Systems  Constitutional: No fever/chills Eyes: No visual changes. ENT: No sore throat. Cardiovascular: Denies chest pain. Respiratory: Positive for cough and shortness of breath. Gastrointestinal: No abdominal pain.  No nausea, no vomiting.  No diarrhea.  No constipation. Genitourinary: Negative for dysuria. Musculoskeletal: Negative for back pain.  Positive for myalgias. Skin: Negative for rash. Neurological: Negative for headaches, focal weakness or  numbness.  ____________________________________________   PHYSICAL EXAM:  VITAL SIGNS: ED Triage Vitals  Enc Vitals Group     BP 09/13/19 1342 (!) 172/111     Pulse Rate 09/13/19 1342 (!) 112     Resp 09/13/19 1342 20     Temp 09/13/19 1342 98.1 F (36.7 C)     Temp Source 09/13/19 1342 Oral     SpO2 09/13/19 1342 95 %     Weight 09/13/19 1343 192 lb (87.1 kg)     Height 09/13/19 1343 5\' 4"  (1.626 m)     Head Circumference --      Peak Flow --      Pain Score 09/13/19 1342 5     Pain Loc --      Pain Edu? --      Excl. in [place]? --     Constitutional: Alert and oriented. Eyes: Conjunctivae are normal. Head: Atraumatic. Nose: No congestion/rhinnorhea. Mouth/Throat: Mucous membranes are moist. Neck: Normal ROM Cardiovascular: Tachycardic, regular rhythm. Grossly normal heart sounds. Respiratory: Normal respiratory effort.  No retractions. Lungs CTAB.  Diffuse chest wall tenderness. Gastrointestinal: Soft and nontender. No distention.  Genitourinary: deferred Musculoskeletal: No lower extremity tenderness nor edema. Neurologic:  Normal speech and language. No gross focal neurologic deficits are appreciated. Skin:  Skin is warm, dry and intact. No rash noted. Psychiatric: Mood and affect are normal. Speech and behavior are normal.  ____________________________________________   LABS (all labs ordered are listed, but only abnormal results are displayed)  Labs Reviewed  COMPREHENSIVE METABOLIC PANEL - Abnormal; Notable for the following components:      Result Value   Potassium 2.9 (*)    Glucose, Bld 129 (*)    Total Protein 8.2 (*)    All other components within normal limits  CBC - Abnormal; Notable for the following components:   RBC 5.38 (*)    All other components within normal limits  URINALYSIS, COMPLETE (UACMP) WITH MICROSCOPIC - Abnormal; Notable for the following components:   Color, Urine YELLOW (*)    APPearance HAZY (*)    Protein, ur 100 (*)     Bacteria, UA RARE (*)    All other components within normal limits  NOVEL CORONAVIRUS, NAA (HOSP ORDER, SEND-OUT TO REF LAB; TAT 18-24 HRS)  LIPASE, BLOOD  MAGNESIUM   ____________________________________________  EKG  ED ECG REPORT I, Chesley Noonharles Kin Galbraith, the attending physician, personally viewed and interpreted this ECG.   Date: 09/13/2019  EKG Time: 13:38  Rate: 118  Rhythm: sinus tachycardia  Axis: LAD  Intervals:first-degree A-V block  and QT prolongation  ST&T Change: Nonspecific T wave changes    PROCEDURES  Procedure(s) performed (including Critical Care):  Procedures   ____________________________________________   INITIAL IMPRESSION / ASSESSMENT AND PLAN / ED COURSE       58 year old female with history of hypertension presents to the ED with malaise, fatigue, generalized weakness, cough, and myalgias for the past few days after potential exposure to COVID-19.  She is in no respiratory distress and maintaining O2 sats on room air.  EKG without acute ischemic changes, her pain appears very diffuse, does not localize to her chest or abdomen, do not suspect ACS or intra-abdominal pathology.  Labs unremarkable thus far, will check chest x-ray, test for COVID-19.  Chest x-ray negative for acute process, COVID-19 testing pending although relatively high suspicion for this given her close contact.  Remainder of lab significant for hypokalemia, patient given IV and p.o. potassium as well as magnesium.  Counseled patient to follow guidelines regarding quarantine and return to the ED for new or worsening symptoms.  Patient agrees with plan.      ____________________________________________   FINAL CLINICAL IMPRESSION(S) / ED DIAGNOSES  Final diagnoses:  Myalgia  Cough  Close Exposure to Covid-19 Virus  Hyponatremia     ED Discharge Orders    None       Note:  This document was prepared using Dragon voice recognition software and may include unintentional  dictation errors.   Chesley NoonJessup, Dystany Duffy, MD 09/13/19 2120

## 2019-09-14 LAB — NOVEL CORONAVIRUS, NAA (HOSP ORDER, SEND-OUT TO REF LAB; TAT 18-24 HRS): SARS-CoV-2, NAA: NOT DETECTED

## 2019-09-16 ENCOUNTER — Telehealth: Payer: Self-pay | Admitting: *Deleted

## 2019-09-16 NOTE — Telephone Encounter (Signed)
Pt given result of COVID test; she verbalized understanding. 

## 2019-09-22 ENCOUNTER — Emergency Department: Payer: Medicare Other

## 2019-09-22 ENCOUNTER — Encounter: Payer: Self-pay | Admitting: Emergency Medicine

## 2019-09-22 ENCOUNTER — Emergency Department
Admission: EM | Admit: 2019-09-22 | Discharge: 2019-09-22 | Disposition: A | Discharge status: Home / Self Care | Payer: Medicare Other | Attending: Emergency Medicine | Admitting: Emergency Medicine

## 2019-09-22 ENCOUNTER — Other Ambulatory Visit: Payer: Self-pay

## 2019-09-22 DIAGNOSIS — J020 Streptococcal pharyngitis: Secondary | ICD-10-CM | POA: Insufficient documentation

## 2019-09-22 DIAGNOSIS — Z7982 Long term (current) use of aspirin: Secondary | ICD-10-CM | POA: Diagnosis not present

## 2019-09-22 DIAGNOSIS — Z79899 Other long term (current) drug therapy: Secondary | ICD-10-CM | POA: Diagnosis not present

## 2019-09-22 DIAGNOSIS — R51 Headache: Secondary | ICD-10-CM | POA: Insufficient documentation

## 2019-09-22 DIAGNOSIS — U071 COVID-19: Secondary | ICD-10-CM | POA: Diagnosis not present

## 2019-09-22 DIAGNOSIS — R509 Fever, unspecified: Secondary | ICD-10-CM | POA: Diagnosis present

## 2019-09-22 DIAGNOSIS — I10 Essential (primary) hypertension: Secondary | ICD-10-CM | POA: Diagnosis not present

## 2019-09-22 LAB — CBC WITH DIFFERENTIAL/PLATELET
Abs Immature Granulocytes: 0.01 10*3/uL (ref 0.00–0.07)
Basophils Absolute: 0 10*3/uL (ref 0.0–0.1)
Basophils Relative: 0 %
Eosinophils Absolute: 0.1 10*3/uL (ref 0.0–0.5)
Eosinophils Relative: 1 %
HCT: 41.3 % (ref 36.0–46.0)
Hemoglobin: 13.5 g/dL (ref 12.0–15.0)
Immature Granulocytes: 0 %
Lymphocytes Relative: 20 %
Lymphs Abs: 0.9 10*3/uL (ref 0.7–4.0)
MCH: 26 pg (ref 26.0–34.0)
MCHC: 32.7 g/dL (ref 30.0–36.0)
MCV: 79.6 fL — ABNORMAL LOW (ref 80.0–100.0)
Monocytes Absolute: 0.2 10*3/uL (ref 0.1–1.0)
Monocytes Relative: 5 %
Neutro Abs: 3.3 10*3/uL (ref 1.7–7.7)
Neutrophils Relative %: 74 %
Platelets: 217 10*3/uL (ref 150–400)
RBC: 5.19 MIL/uL — ABNORMAL HIGH (ref 3.87–5.11)
RDW: 13.8 % (ref 11.5–15.5)
WBC: 4.5 10*3/uL (ref 4.0–10.5)
nRBC: 0 % (ref 0.0–0.2)

## 2019-09-22 LAB — COMPREHENSIVE METABOLIC PANEL
ALT: 18 U/L (ref 0–44)
AST: 25 U/L (ref 15–41)
Albumin: 3.7 g/dL (ref 3.5–5.0)
Alkaline Phosphatase: 75 U/L (ref 38–126)
Anion gap: 10 (ref 5–15)
BUN: 18 mg/dL (ref 6–20)
CO2: 24 mmol/L (ref 22–32)
Calcium: 9.2 mg/dL (ref 8.9–10.3)
Chloride: 105 mmol/L (ref 98–111)
Creatinine, Ser: 0.94 mg/dL (ref 0.44–1.00)
GFR calc Af Amer: >60 mL/min (ref >60)
GFR calc non Af Amer: >60 mL/min (ref >60)
Glucose, Bld: 129 mg/dL — ABNORMAL HIGH (ref 70–99)
Potassium: 3.2 mmol/L — ABNORMAL LOW (ref 3.5–5.1)
Sodium: 139 mmol/L (ref 135–145)
Total Bilirubin: 0.6 mg/dL (ref 0.3–1.2)
Total Protein: 7.6 g/dL (ref 6.5–8.1)

## 2019-09-22 LAB — TSH: TSH: 4.498 u[IU]/mL (ref 0.350–4.500)

## 2019-09-22 LAB — TROPONIN I (HIGH SENSITIVITY): Troponin I (High Sensitivity): 8 ng/L (ref <18)

## 2019-09-22 LAB — SARS CORONAVIRUS 2 BY RT PCR (HOSPITAL ORDER, PERFORMED IN ~~LOC~~ HOSPITAL LAB): SARS Coronavirus 2: POSITIVE — AB

## 2019-09-22 LAB — GROUP A STREP BY PCR: Group A Strep by PCR: DETECTED — AB

## 2019-09-22 MED ORDER — ACETAMINOPHEN 500 MG PO TABS
1000.0000 mg | ORAL_TABLET | Freq: Once | ORAL | Status: AC
  Administered 2019-09-22: 1000 mg via ORAL
  Filled 2019-09-22: qty 2

## 2019-09-22 MED ORDER — AMOXICILLIN-POT CLAVULANATE 875-125 MG PO TABS: 1.0000 | ORAL_TABLET | Freq: Two times a day (BID) | ORAL | 0 refills | Status: AC

## 2019-09-22 MED ORDER — DEXAMETHASONE 4 MG PO TABS
10.0000 mg | ORAL_TABLET | Freq: Once | ORAL | Status: AC
  Administered 2019-09-22: 10 mg via ORAL
  Filled 2019-09-22: qty 2.5

## 2019-09-22 MED ORDER — CLONIDINE HCL 0.1 MG PO TABS
0.1000 mg | ORAL_TABLET | Freq: Once | ORAL | Status: AC
  Administered 2019-09-22: 0.1 mg via ORAL
  Filled 2019-09-22: qty 1

## 2019-09-22 MED ORDER — KETOROLAC TROMETHAMINE 60 MG/2ML IM SOLN
60.0000 mg | Freq: Once | INTRAMUSCULAR | Status: AC
  Administered 2019-09-22: 60 mg via INTRAMUSCULAR
  Filled 2019-09-22: qty 2

## 2019-09-22 MED ORDER — ETODOLAC 500 MG PO TABS: 500.0000 mg | ORAL_TABLET | Freq: Two times a day (BID) | ORAL | 0 refills | Status: DC

## 2019-09-22 MED ORDER — AMOXICILLIN-POT CLAVULANATE 875-125 MG PO TABS
1.0000 | ORAL_TABLET | Freq: Once | ORAL | Status: AC
  Administered 2019-09-22: 1 via ORAL
  Filled 2019-09-22: qty 1

## 2019-09-22 MED ORDER — ETODOLAC 500 MG PO TABS: 500.0000 mg | ORAL_TABLET | Freq: Two times a day (BID) | ORAL | 0 refills | Status: AC

## 2019-09-22 MED ORDER — AMOXICILLIN-POT CLAVULANATE 875-125 MG PO TABS: 1.0000 | ORAL_TABLET | Freq: Two times a day (BID) | ORAL | 0 refills | Status: DC

## 2019-09-22 NOTE — ED Notes (Signed)
Awaiting medication from pharmacy prior to discharge.

## 2019-09-22 NOTE — Discharge Instructions (Signed)
Follow-up with your doctor in 1 week as needed  Take the antibiotics as prescribed  Take the Lodine as prescribed.  You can also start taking Tylenol 1000 mg every 6 hours for fever or headache/pain.

## 2019-09-22 NOTE — ED Provider Notes (Addendum)
[facility] Emergency Department Provider Note  ____________________________________________   First MD Initiated Contact with Patient 09/22/19 1221     (approximate)  I have reviewed the triage vital signs and the nursing notes.   HISTORY  Chief Complaint Fever, Chills, and Cough    HPI Elizabeth Coffey is a 58 y.o. female  Here with ongoing fever, chills. Pt states that her sx started approximately 1 week ago. She developed chills, cough, and subjective fevers. She was seen here on 9/16 and tested neg for COVID, sent home. Since then, she reports near daily fever, chills, and off and on cold sweats. She has had mild cough with SOB. No CP. No abd pain, nausea, or vomiting. She has had intermittent but not persistent diarrhea. No dysuria. No sputum production. Of note, her son who she lives with tested + for COVID prior to this. No rash. Has bee intermittently taking APAP and ibuprofen w/ minimal relief. Last dose was last night. She has also developed a mild, aching, sore throat worse w/ swallowing. No stridor. No difficulty speaking or swallowing.        Past Medical History:  Diagnosis Date  . Anxiety   . Arthritis   . Hypertension   . Peptic ulcer     Patient Active Problem List   Diagnosis Date Noted  . Numbness and tingling of left arm and leg 08/24/2018  . Sleep disturbance 08/24/2018  . Chest pain at rest 07/11/2018  . Headache disorder 06/10/2018  . Poorly-controlled hypertension 03/25/2018  . Cervical radiculopathy 01/31/2018  . Hemorrhoids 12/18/2017  . Mild carpal tunnel syndrome of right wrist 12/18/2017  . Depression with anxiety 09/27/2017  . Cervical muscle strain 09/23/2016  . Degenerative disc disease, lumbar 09/23/2016  . Essential hypertension 09/23/2016  . Gastroesophageal reflux disease without esophagitis 09/23/2016  . Abnormal mammogram of left breast 01/06/2016    Past Surgical History:  Procedure Laterality Date  .  ABDOMINAL HYSTERECTOMY    . BREAST CYST EXCISION      Prior to Admission medications   Medication Sig Start Date End Date Taking? Authorizing Provider  albuterol-ipratropium (COMBIVENT) 18-103 MCG/ACT inhaler Inhale 1 puff into the lungs every 4 (four) hours as needed for wheezing or shortness of breath. 09/29/15   Beers, Charmayne Sheerharles M, PA-C  amLODipine-benazepril (LOTREL) 10-20 MG capsule Take 1 capsule by mouth daily. 08/13/16   Jeanmarie PlantMcShane, James A, MD  amoxicillin-clavulanate (AUGMENTIN) 875-125 MG tablet Take 1 tablet by mouth 2 (two) times daily for 10 days. 09/22/19 10/02/19  Shaune PollackIsaacs, Favian Kittleson, MD  aspirin EC 81 MG tablet Take by mouth.    [provider]  cloNIDine (CATAPRES) 0.2 MG tablet TAKE 1 TABLET (0.2 MG TOTAL) BY MOUTH 2 (TWO) TIMES DAILY 08/08/18   [provider]  cyclobenzaprine (FLEXERIL) 10 MG tablet Take 1 tablet (10 mg total) by mouth 3 (three) times daily as needed for muscle spasms. 05/15/17   Triplett, Cari B, FNP  DULoxetine (CYMBALTA) 30 MG capsule TAKE 1 CAPSULE (30 MG TOTAL) BY MOUTH DAILY. FOR MOOD 11/18/18   Jomarie LongsEappen, Saramma, MD  etodolac (LODINE) 500 MG tablet Take 1 tablet (500 mg total) by mouth 2 (two) times daily for 5 days. 09/22/19 09/27/19  Shaune PollackIsaacs, Kensley Lares, MD  guaiFENesin-codeine 100-10 MG/5ML syrup Take 10 mLs by mouth every 4 (four) hours as needed for cough. 09/29/15   Beers, Charmayne Sheerharles M, PA-C  hydrochlorothiazide (HYDRODIURIL) 25 MG tablet Take by mouth. 06/10/18 06/10/19  [provider]  HYDROcodone-acetaminophen (  NORCO/VICODIN) 5-325 MG tablet Take 1 tablet by mouth every 4 (four) hours as needed. 07/11/18   Minna Antis, MD  lidocaine (XYLOCAINE) 5 % ointment Apply 1 application topically as needed. 12/08/17   Triplett, Kasandra Knudsen, FNP  linaclotide (LINZESS) 290 MCG CAPS capsule Take by mouth.    [provider]  meloxicam (MOBIC) 15 MG tablet Take 15 mg by mouth daily.    [provider]  metoprolol succinate (TOPROL-XL) 50  MG 24 hr tablet TAKE 1 AND 1/2 TABLETS (75 MG TOTAL) BY MOUTH ONCE DAILY 06/14/18   [provider]  metroNIDAZOLE (FLAGYL) 500 MG tablet Take 1 tablet (500 mg total) by mouth 2 (two) times daily. 08/31/17   Triplett, Rulon Eisenmenger B, FNP  naproxen (NAPROSYN) 500 MG tablet Take 1 tablet (500 mg total) by mouth 2 (two) times daily with a meal. 08/03/16   Jene Every, MD  nortriptyline (PAMELOR) 10 MG capsule Take 2 capsules (20 mg total) by mouth at bedtime. 10/26/18   Jomarie Longs, MD  omeprazole (PRILOSEC) 40 MG capsule Take 40 mg by mouth daily.    [provider]  pseudoephedrine (SUDAFED) 60 MG tablet Take 1 tablet (60 mg total) by mouth every 4 (four) hours as needed for congestion. 09/29/15   Beers, Charmayne Sheer, PA-C  QUEtiapine (SEROQUEL) 25 MG tablet TAKE 1 TABLET (25 MG TOTAL) BY MOUTH AT BEDTIME. FOR MOOD AND SLEEP 11/18/18   Jomarie Longs, MD  ranitidine (ZANTAC) 150 MG capsule Take by mouth. 08/08/18 08/08/19  [provider]  tiZANidine (ZANAFLEX) 2 MG tablet 1-2 tablets at bedtime as needed for muscle spasm. 08/08/18   [provider]  triamterene-hydrochlorothiazide (MAXZIDE-25) 37.5-25 MG tablet Take 1 tablet by mouth daily. 08/13/16   Jeanmarie Plant, MD    Allergies Hydrocodone-acetaminophen, Aspirin, Mobic [meloxicam], Tramadol, Chocolate, and Cocoa  Family History  Problem Relation Age of Onset  . Mental illness Maternal Uncle     Social History Social History   Tobacco Use  . Smoking status: Never Smoker  . Smokeless tobacco: Never Used  Substance Use Topics  . Alcohol use: No  . Drug use: No    Review of Systems  Review of Systems  Constitutional: Positive for chills and fatigue. Negative for fever.  HENT: Positive for sore throat. Negative for congestion.   Eyes: Negative for visual disturbance.  Respiratory: Positive for cough and shortness of breath.   Cardiovascular: Negative for chest pain.  Gastrointestinal: Positive for  diarrhea and nausea. Negative for abdominal pain and vomiting.  Genitourinary: Negative for flank pain.  Musculoskeletal: Negative for back pain and neck pain.  Skin: Negative for rash and wound.  Neurological: Negative for weakness.  All other systems reviewed and are negative.    ____________________________________________  PHYSICAL EXAM:      VITAL SIGNS: ED Triage Vitals  Enc Vitals Group     BP 09/22/19 1149 (!) 175/114     Pulse Rate 09/22/19 1149 97     Resp 09/22/19 1149 18     Temp 09/22/19 1149 99 F (37.2 C)     Temp Source 09/22/19 1149 Oral     SpO2 09/22/19 1149 100 %     Weight 09/22/19 1150 192 lb (87.1 kg)     Height 09/22/19 1150  (1.626 m)     Head Circumference --      Peak Flow --      Pain Score 09/22/19 1150 0     Pain Loc --  Pain Edu? --      Excl. in [place]? --      Physical Exam Vitals signs and nursing note reviewed.  Constitutional:      General: She is not in acute distress.    Appearance: She is well-developed.  HENT:     Head: Normocephalic and atraumatic.     Nose: Congestion present.     Mouth/Throat:     Pharynx: Posterior oropharyngeal erythema present.  Eyes:     Conjunctiva/sclera: Conjunctivae normal.  Neck:     Musculoskeletal: Neck supple.  Cardiovascular:     Rate and Rhythm: Normal rate and regular rhythm.     Heart sounds: Normal heart sounds. No murmur. No friction rub.  Pulmonary:     Effort: Pulmonary effort is normal. No respiratory distress.     Breath sounds: Examination of the right-lower field reveals rales. Examination of the left-lower field reveals rales. Rales present. No wheezing.     Comments: Scattered rales that clear with coughing, normal WOB Abdominal:     General: There is no distension.     Palpations: Abdomen is soft.     Tenderness: There is no abdominal tenderness.  Skin:    General: Skin is warm.     Capillary Refill: Capillary refill takes less than 2 seconds.  Neurological:      Mental Status: She is alert and oriented to person, place, and time.     Motor: No abnormal muscle tone.       ____________________________________________   LABS (all labs ordered are listed, but only abnormal results are displayed)  Labs Reviewed  GROUP A STREP BY PCR - Abnormal; Notable for the following components:      Result Value   Group A Strep by PCR DETECTED (*)    All other components within normal limits  SARS CORONAVIRUS 2 (HOSPITAL ORDER, [facility]) - Abnormal; Notable for the following components:   SARS Coronavirus 2 POSITIVE (*)    All other components within normal limits  CBC WITH DIFFERENTIAL/PLATELET - Abnormal; Notable for the following components:   RBC 5.19 (*)    MCV 79.6 (*)    All other components within normal limits  COMPREHENSIVE METABOLIC PANEL - Abnormal; Notable for the following components:   Potassium 3.2 (*)    Glucose, Bld 129 (*)    All other components within normal limits  TSH  TROPONIN I (HIGH SENSITIVITY)    ____________________________________________  EKG:  Sinus tachycardia, VR 105. PR 158, QRS 68, QTc 481. Non-specific TWC are unchanged/improved diffusely when compared to 09/13/2019. No ischemic or infarct noted. ________________________________________  RADIOLOGY All imaging, including plain films, CT scans, and ultrasounds, independently reviewed by me, and interpretations confirmed via formal radiology reads.  ED MD interpretation:   CXR: Clear  Official radiology report(s): Dg Chest Portable 1 View  Result Date: 09/22/2019 CLINICAL DATA:  Fever, cough, shortness of breath. EXAM: PORTABLE CHEST 1 VIEW COMPARISON:  Chest radiograph 09/13/2019 FINDINGS: Heart size within normal limits. No focal consolidation within the lungs. No evidence of pleural effusion or pneumothorax. No acute bony abnormality. Dextrocurvature of the thoracic spine. IMPRESSION: No evidence of active cardiopulmonary disease.  Electronically Signed   By: Kellie Simmering   On: 09/22/2019 12:54    ____________________________________________  PROCEDURES   Procedure(s) performed (including Critical Care):  Procedures  ____________________________________________  INITIAL IMPRESSION / MDM / [place] / ED COURSE  As part of my medical decision making, I reviewed  the following data within the electronic MEDICAL RECORD NUMBER Notes from prior ED visits and [place] Controlled Substance Database      *Elizabeth Coffey was evaluated in Emergency Department on 09/22/2019 for the symptoms described in the history of present illness. She was evaluated in the context of the global COVID-19 pandemic, which necessitated consideration that the patient might be at risk for infection with the SARS-CoV-2 virus that causes COVID-19. Institutional protocols and algorithms that pertain to the evaluation of patients at risk for COVID-19 are in a state of rapid change based on information released by regulatory bodies including the CDC and federal and state organizations. These policies and algorithms were followed during the patient's care in the ED.  Some ED evaluations and interventions may be delayed as a result of limited staffing during the pandemic.*   Clinical Course as of Sep 21 1421  Fri Sep 22, 2019  1244 58 yo very well appearing female here with ongoing fever, chills, chronic HA. Suspect ongoing viral illness, query COVID-19, influenza, other viral illness. DDx also includes strep, CAP. Will check CXR, rapid strep and covid. Pt is o/w very well appearing and in NAD. Satting well on RA. She does report h/o thyroid issues so will check a TSH, though thyroid storm somewhat less likely. She has a well documented h/o chronic HA and HTN so do not feel this is directly contributing and she has no sx to suggest meningitis, encephalitis, CVA, or ICH.   [CI]  1315 CBC reassuring. CMP with mild hypokalemia that is improved. TSH, COVID pending.    [CI]  1318 TSH wnl.  TSH [CI]  1414 Strep +. Will treat empirically. Decadron given. Will d/c with outpt follow-up.   [CI]    Clinical Course User Index [CI] Shaune Pollack, MD    Medical Decision Making: As above.  Incidentally, pt also COVID+. I suspect this is also contributing. She is not hypoxic, has normal WOB, and clear CXR. Supportive car encouraged. Decadron given.  ____________________________________________  FINAL CLINICAL IMPRESSION(S) / ED DIAGNOSES  Final diagnoses:  Strep pharyngitis     MEDICATIONS GIVEN DURING THIS VISIT:  Medications  amoxicillin-clavulanate (AUGMENTIN) 875-125 MG per tablet 1 tablet (has no administration in time range)  dexamethasone (DECADRON) tablet 10 mg (has no administration in time range)  acetaminophen (TYLENOL) tablet 1,000 mg (1,000 mg Oral Given 09/22/19 1247)  ketorolac (TORADOL) injection 60 mg (60 mg Intramuscular Given 09/22/19 1259)  cloNIDine (CATAPRES) tablet 0.1 mg (0.1 mg Oral Given 09/22/19 1258)     ED Discharge Orders         Ordered    amoxicillin-clavulanate (AUGMENTIN) 875-125 MG tablet  2 times daily,   Status:  Discontinued     09/22/19 1408    etodolac (LODINE) 500 MG tablet  2 times daily,   Status:  Discontinued     09/22/19 1408    amoxicillin-clavulanate (AUGMENTIN) 875-125 MG tablet  2 times daily     09/22/19 1410    etodolac (LODINE) 500 MG tablet  2 times daily     09/22/19 1410           Note:  This document was prepared using Dragon voice recognition software and may include unintentional dictation errors.   Shaune Pollack, MD 09/22/19 1415    Shaune Pollack, MD 09/22/19 [phone]

## 2019-09-22 NOTE — ED Notes (Signed)
Date and time results received: 09/22/19 2:21 PM  (use smartphrase ".now" to insert current time)  Test: coronavirus Critical Value: positive Name of Provider Notified: Dr. Ellender Hose

## 2019-09-22 NOTE — ED Triage Notes (Signed)
Pt presents to ED via POV from [facility]. Pt states originally went to [facility] for fever, chills, cough. States was tested for Covid on 9/16 and tested negative. States was sent to [facility] because her "heart rate was too high and her BP was too high". Pt noted to be wearing heavy leather jacket upon arrival, reports that she is sweating, mild diaphoresis noted to patient's forehead upon arrival to ED.   Pt states hx of HTN at this time.

## 2019-09-22 NOTE — ED Notes (Signed)
Pt alert and oriented X 4, stable for discharge. RR even and unlabored, color WNL. Discussed discharge instructions and follow up when appropriate. Instructed to follow up with ER for any life threatening symptoms or concerns that patient or family of patient may have  

## 2019-09-22 NOTE — ED Notes (Signed)
This RN spoke with Dr. Jimmye Norman regarding patient care. VORB for bloodwork and EKG.

## 2019-10-12 ENCOUNTER — Other Ambulatory Visit: Payer: Self-pay | Admitting: Family Medicine

## 2019-10-12 DIAGNOSIS — M542 Cervicalgia: Secondary | ICD-10-CM

## 2019-10-12 DIAGNOSIS — R519 Headache, unspecified: Secondary | ICD-10-CM

## 2019-10-12 DIAGNOSIS — R200 Anesthesia of skin: Secondary | ICD-10-CM

## 2019-10-16 ENCOUNTER — Other Ambulatory Visit: Payer: Self-pay

## 2019-10-16 ENCOUNTER — Ambulatory Visit
Admission: RE | Admit: 2019-10-16 | Discharge: 2019-10-16 | Disposition: A | Discharge status: Home / Self Care | Payer: Medicare Other | Source: Ambulatory Visit | Attending: Family Medicine | Admitting: Family Medicine

## 2019-10-16 DIAGNOSIS — M542 Cervicalgia: Secondary | ICD-10-CM | POA: Diagnosis present

## 2019-10-16 DIAGNOSIS — R200 Anesthesia of skin: Secondary | ICD-10-CM | POA: Insufficient documentation

## 2019-10-16 DIAGNOSIS — R519 Headache, unspecified: Secondary | ICD-10-CM | POA: Diagnosis present

## 2019-10-26 ENCOUNTER — Encounter: Payer: Self-pay | Admitting: Emergency Medicine

## 2019-10-26 ENCOUNTER — Other Ambulatory Visit: Payer: Self-pay

## 2019-10-26 ENCOUNTER — Emergency Department
Admission: EM | Admit: 2019-10-26 | Discharge: 2019-10-26 | Disposition: A | Discharge status: Home / Self Care | Payer: Medicare Other | Attending: Emergency Medicine | Admitting: Emergency Medicine

## 2019-10-26 DIAGNOSIS — R220 Localized swelling, mass and lump, head: Secondary | ICD-10-CM | POA: Diagnosis present

## 2019-10-26 DIAGNOSIS — I10 Essential (primary) hypertension: Secondary | ICD-10-CM | POA: Diagnosis not present

## 2019-10-26 DIAGNOSIS — G510 Bell's palsy: Secondary | ICD-10-CM | POA: Diagnosis not present

## 2019-10-26 DIAGNOSIS — Z79899 Other long term (current) drug therapy: Secondary | ICD-10-CM | POA: Insufficient documentation

## 2019-10-26 MED ORDER — METHYLPREDNISOLONE 4 MG PO TBPK: ORAL_TABLET | ORAL | 0 refills | Status: DC

## 2019-10-26 MED ORDER — ACYCLOVIR 800 MG PO TABS: 800.0000 mg | ORAL_TABLET | Freq: Three times a day (TID) | ORAL | 0 refills | Status: DC

## 2019-10-26 MED ORDER — METHYLPREDNISOLONE SODIUM SUCC 125 MG IJ SOLR
125.0000 mg | Freq: Once | INTRAMUSCULAR | Status: AC
  Administered 2019-10-26: 125 mg via INTRAMUSCULAR
  Filled 2019-10-26: qty 2

## 2019-10-26 MED ORDER — TRIAMCINOLONE ACETONIDE 0.025 % EX OINT: 1.0000 "application " | TOPICAL_OINTMENT | Freq: Two times a day (BID) | CUTANEOUS | 0 refills | Status: DC

## 2019-10-26 MED ORDER — FAMOTIDINE 20 MG PO TABS
40.0000 mg | ORAL_TABLET | Freq: Once | ORAL | Status: AC
  Administered 2019-10-26: 40 mg via ORAL
  Filled 2019-10-26: qty 2

## 2019-10-26 MED ORDER — DIPHENHYDRAMINE HCL 25 MG PO CAPS
25.0000 mg | ORAL_CAPSULE | Freq: Once | ORAL | Status: AC
  Administered 2019-10-26: 25 mg via ORAL
  Filled 2019-10-26: qty 1

## 2019-10-26 NOTE — ED Triage Notes (Addendum)
Pt states upper and lower lips swelling from an allergic reaction, has been seen here for the same. Pt states the only treatment that works for her is prednisone cream. Pt is not sure what she is allergic to. No shob at this time. Does not appear swollen to this RN.

## 2019-10-26 NOTE — ED Provider Notes (Signed)
[facility] Emergency Department Provider Note  ____________________________________________   First MD Initiated Contact with Patient 10/26/19 1449     (approximate)  I have reviewed the triage vital signs and the nursing notes.   HISTORY  Chief Complaint Oral Swelling    HPI Elizabeth Coffey is a 58 y.o. female presents emergency department complaint of her upper and lower lip swelling from a questionable allergic reaction.  Patient's been seen here for the same before.  She states now she has tingling into the left side of her face.  Tingling around her lips.  She was seen at her regular doctors in the MRI showed a lacunar infarct.  She has an appointment with neurology next week.    Past Medical History:  Diagnosis Date  . Anxiety   . Arthritis   . Hypertension   . Peptic ulcer     Patient Active Problem List   Diagnosis Date Noted  . Numbness and tingling of left arm and leg 08/24/2018  . Sleep disturbance 08/24/2018  . Chest pain at rest 07/11/2018  . Headache disorder 06/10/2018  . Poorly-controlled hypertension 03/25/2018  . Cervical radiculopathy 01/31/2018  . Hemorrhoids 12/18/2017  . Mild carpal tunnel syndrome of right wrist 12/18/2017  . Depression with anxiety 09/27/2017  . Cervical muscle strain 09/23/2016  . Degenerative disc disease, lumbar 09/23/2016  . Essential hypertension 09/23/2016  . Gastroesophageal reflux disease without esophagitis 09/23/2016  . Abnormal mammogram of left breast 01/06/2016    Past Surgical History:  Procedure Laterality Date  . ABDOMINAL HYSTERECTOMY    . BREAST CYST EXCISION      Prior to Admission medications   Medication Sig Start Date End Date Taking? Authorizing Provider  acyclovir (ZOVIRAX) 800 MG tablet Take 1 tablet (800 mg total) by mouth 3 (three) times daily. 10/26/19   Cristin Penaflor, Roselyn Bering, PA-C  albuterol-ipratropium (COMBIVENT) 18-103 MCG/ACT inhaler Inhale 1 puff into the lungs every 4  (four) hours as needed for wheezing or shortness of breath. 09/29/15   Beers, Charmayne Sheer, PA-C  amLODipine-benazepril (LOTREL) 10-20 MG capsule Take 1 capsule by mouth daily. 08/13/16   Jeanmarie Plant, MD  aspirin EC 81 MG tablet Take by mouth.    [provider]  cloNIDine (CATAPRES) 0.2 MG tablet TAKE 1 TABLET (0.2 MG TOTAL) BY MOUTH 2 (TWO) TIMES DAILY 08/08/18   [provider]  cyclobenzaprine (FLEXERIL) 10 MG tablet Take 1 tablet (10 mg total) by mouth 3 (three) times daily as needed for muscle spasms. 05/15/17   Triplett, Cari B, FNP  DULoxetine (CYMBALTA) 30 MG capsule TAKE 1 CAPSULE (30 MG TOTAL) BY MOUTH DAILY. FOR MOOD 11/18/18   Jomarie Longs, MD  guaiFENesin-codeine 100-10 MG/5ML syrup Take 10 mLs by mouth every 4 (four) hours as needed for cough. 09/29/15   Beers, Charmayne Sheer, PA-C  hydrochlorothiazide (HYDRODIURIL) 25 MG tablet Take by mouth. 06/10/18 06/10/19  [provider]  linaclotide Karlene Einstein) 290 MCG CAPS capsule Take by mouth.    [provider]  meloxicam (MOBIC) 15 MG tablet Take 15 mg by mouth daily.    [provider]  methylPREDNISolone (MEDROL DOSEPAK) 4 MG TBPK tablet Take 6 pills on day one then decrease by 1 pill each day 10/26/19   Faythe Ghee, PA-C  metoprolol succinate (TOPROL-XL) 50 MG 24 hr tablet TAKE 1 AND 1/2 TABLETS (75 MG TOTAL) BY MOUTH ONCE DAILY 06/14/18   [provider]  nortriptyline (PAMELOR) 10 MG capsule  Take 2 capsules (20 mg total) by mouth at bedtime. 10/26/18   Jomarie LongsEappen, Saramma, MD  omeprazole (PRILOSEC) 40 MG capsule Take 40 mg by mouth daily.    [provider]  pseudoephedrine (SUDAFED) 60 MG tablet Take 1 tablet (60 mg total) by mouth every 4 (four) hours as needed for congestion. 09/29/15   Beers, Charmayne Sheerharles M, PA-C  QUEtiapine (SEROQUEL) 25 MG tablet TAKE 1 TABLET (25 MG TOTAL) BY MOUTH AT BEDTIME. FOR MOOD AND SLEEP 11/18/18   Jomarie LongsEappen, Saramma, MD  tiZANidine (ZANAFLEX) 2 MG tablet 1-2  tablets at bedtime as needed for muscle spasm. 08/08/18   [provider]  triamcinolone (KENALOG) 0.025 % ointment Apply 1 application topically 2 (two) times daily. 10/26/19   Theoden Mauch, Roselyn BeringSusan W, PA-C  triamterene-hydrochlorothiazide (MAXZIDE-25) 37.5-25 MG tablet Take 1 tablet by mouth daily. 08/13/16   Jeanmarie PlantMcShane, James A, MD    Allergies Hydrocodone-acetaminophen, Aspirin, Mobic [meloxicam], Tramadol, Chocolate, and Cocoa  Family History  Problem Relation Age of Onset  . Mental illness Maternal Uncle     Social History Social History   Tobacco Use  . Smoking status: Never Smoker  . Smokeless tobacco: Never Used  Substance Use Topics  . Alcohol use: No  . Drug use: No    Review of Systems  Constitutional: No fever/chills Eyes: No visual changes. ENT: No sore throat. Respiratory: Denies cough Genitourinary: Negative for dysuria. Musculoskeletal: Negative for back pain. Neuro: Numbness tingling around the lips and left side of face Skin: Negative for rash.    ____________________________________________   PHYSICAL EXAM:  VITAL SIGNS: ED Triage Vitals  Enc Vitals Group     BP 10/26/19 1439 (!) 192/137     Pulse Rate 10/26/19 1439 89     Resp 10/26/19 1540 18     Temp 10/26/19 1439 98.4 F (36.9 C)     Temp Source 10/26/19 1439 Oral     SpO2 10/26/19 1439 96 %     Weight 10/26/19 1434 200 lb (90.7 kg)     Height 10/26/19 1434 5\' 4"  (1.626 m)     Head Circumference --      Peak Flow --      Pain Score 10/26/19 1433 7     Pain Loc --      Pain Edu? --      Excl. in GC? --     Constitutional: Alert and oriented. Well appearing and in no acute distress. Eyes: Conjunctivae are normal.  Head: Atraumatic.  Positive for decreased mild left-sided face, patient is able to close her eyes, left eyebrow appears normal, Nose: No congestion/rhinnorhea. Mouth/Throat: Mucous membranes are moist.   Neck:  supple no lymphadenopathy noted Cardiovascular: Normal rate,  regular rhythm. Heart sounds are normal Respiratory: Normal respiratory effort.  No retractions, lungs c t a  GU: deferred Musculoskeletal: FROM all extremities, warm and well perfused Neurologic:  Normal speech and language.  Skin:  Skin is warm, dry and intact. No rash noted. Psychiatric: Mood and affect are normal. Speech and behavior are normal.  ____________________________________________   LABS (all labs ordered are listed, but only abnormal results are displayed)  Labs Reviewed - No data to display ____________________________________________   ____________________________________________  RADIOLOGY    ____________________________________________   PROCEDURES  Procedure(s) performed: No  Procedures    ____________________________________________   INITIAL IMPRESSION / ASSESSMENT AND PLAN / ED COURSE  Pertinent labs & imaging results that were available during my care of the patient were reviewed by me and  considered in my medical decision making (see chart for details).   Patient is a 58 year old female presents emergency department see HPI  Physical exam shows patient to appear well.  Positive decreased smile on the left side of the face typical of Bell's palsy.  Patient was given instructions to continue to follow-up with her neurologist next week as previously scheduled.  She was given a prescription for acyclovir, and triamcinolone cream for her rash.  She is to return emergency department for worsening.  She is discharged stable condition.    Elizabeth Coffey was evaluated in Emergency Department on 10/26/2019 for the symptoms described in the history of present illness. She was evaluated in the context of the global COVID-19 pandemic, which necessitated consideration that the patient might be at risk for infection with the SARS-CoV-2 virus that causes COVID-19. Institutional protocols and algorithms that pertain to the evaluation of patients at risk for  COVID-19 are in a state of rapid change based on information released by regulatory bodies including the CDC and federal and state organizations. These policies and algorithms were followed during the patient's care in the ED.   As part of my medical decision making, I reviewed the following data within the [place] notes reviewed and incorporated, Old chart reviewed, Notes from prior ED visits and [place] Controlled Substance Database  ____________________________________________   FINAL CLINICAL IMPRESSION(S) / ED DIAGNOSES  Final diagnoses:  Bell's palsy      NEW MEDICATIONS STARTED DURING THIS VISIT:  Discharge Medication List as of 10/26/2019  3:18 PM    START taking these medications   Details  acyclovir (ZOVIRAX) 800 MG tablet Take 1 tablet (800 mg total) by mouth 3 (three) times daily., Starting Thu 10/26/2019, Normal    methylPREDNISolone (MEDROL DOSEPAK) 4 MG TBPK tablet Take 6 pills on day one then decrease by 1 pill each day, Normal    triamcinolone (KENALOG) 0.025 % ointment Apply 1 application topically 2 (two) times daily., Starting Thu 10/26/2019, Normal         Note:  This document was prepared using Dragon voice recognition software and may include unintentional dictation errors.    Versie Starks, PA-C 10/26/19 1556    Earleen Newport, MD 10/27/19 [phone]

## 2019-10-26 NOTE — ED Notes (Signed)
See triage note  Presents with some swelling to lips  States hx of same  Not sure what she is allergic too  No diff breathing or swallowing

## 2019-10-26 NOTE — Discharge Instructions (Addendum)
Follow-up with your regular doctor.  Keep all of your upcoming appointments.  Return if worsening.

## 2019-11-29 ENCOUNTER — Other Ambulatory Visit: Payer: Self-pay | Admitting: Internal Medicine

## 2019-11-29 DIAGNOSIS — Z1231 Encounter for screening mammogram for malignant neoplasm of breast: Secondary | ICD-10-CM

## 2019-12-05 ENCOUNTER — Ambulatory Visit: Payer: Medicare Other | Attending: Neurology

## 2020-03-01 ENCOUNTER — Emergency Department: Payer: Medicare Other

## 2020-03-01 ENCOUNTER — Encounter: Payer: Self-pay | Admitting: Emergency Medicine

## 2020-03-01 ENCOUNTER — Inpatient Hospital Stay
Admission: EM | Admit: 2020-03-01 | Discharge: 2020-03-04 | DRG: 065 | Disposition: A | Discharge status: Home Health | Payer: Medicare Other | Attending: Internal Medicine | Admitting: Internal Medicine

## 2020-03-01 ENCOUNTER — Other Ambulatory Visit: Payer: Self-pay

## 2020-03-01 DIAGNOSIS — R202 Paresthesia of skin: Secondary | ICD-10-CM

## 2020-03-01 DIAGNOSIS — Z79899 Other long term (current) drug therapy: Secondary | ICD-10-CM

## 2020-03-01 DIAGNOSIS — Z8711 Personal history of peptic ulcer disease: Secondary | ICD-10-CM

## 2020-03-01 DIAGNOSIS — Z20822 Contact with and (suspected) exposure to covid-19: Secondary | ICD-10-CM | POA: Diagnosis present

## 2020-03-01 DIAGNOSIS — I6389 Other cerebral infarction: Secondary | ICD-10-CM | POA: Diagnosis not present

## 2020-03-01 DIAGNOSIS — R5383 Other fatigue: Secondary | ICD-10-CM | POA: Diagnosis present

## 2020-03-01 DIAGNOSIS — Z791 Long term (current) use of non-steroidal anti-inflammatories (NSAID): Secondary | ICD-10-CM | POA: Diagnosis not present

## 2020-03-01 DIAGNOSIS — I6381 Other cerebral infarction due to occlusion or stenosis of small artery: Secondary | ICD-10-CM | POA: Diagnosis present

## 2020-03-01 DIAGNOSIS — I10 Essential (primary) hypertension: Secondary | ICD-10-CM | POA: Diagnosis present

## 2020-03-01 DIAGNOSIS — R531 Weakness: Secondary | ICD-10-CM | POA: Diagnosis present

## 2020-03-01 DIAGNOSIS — I639 Cerebral infarction, unspecified: Secondary | ICD-10-CM | POA: Diagnosis not present

## 2020-03-01 DIAGNOSIS — Z7982 Long term (current) use of aspirin: Secondary | ICD-10-CM

## 2020-03-01 DIAGNOSIS — Z888 Allergy status to other drugs, medicaments and biological substances status: Secondary | ICD-10-CM | POA: Diagnosis not present

## 2020-03-01 DIAGNOSIS — Z886 Allergy status to analgesic agent status: Secondary | ICD-10-CM | POA: Diagnosis not present

## 2020-03-01 DIAGNOSIS — R29810 Facial weakness: Secondary | ICD-10-CM | POA: Diagnosis present

## 2020-03-01 DIAGNOSIS — R200 Anesthesia of skin: Secondary | ICD-10-CM

## 2020-03-01 DIAGNOSIS — Z91018 Allergy to other foods: Secondary | ICD-10-CM | POA: Diagnosis not present

## 2020-03-01 DIAGNOSIS — K219 Gastro-esophageal reflux disease without esophagitis: Secondary | ICD-10-CM | POA: Diagnosis present

## 2020-03-01 DIAGNOSIS — Z8673 Personal history of transient ischemic attack (TIA), and cerebral infarction without residual deficits: Secondary | ICD-10-CM | POA: Diagnosis not present

## 2020-03-01 DIAGNOSIS — F418 Other specified anxiety disorders: Secondary | ICD-10-CM | POA: Diagnosis present

## 2020-03-01 DIAGNOSIS — R471 Dysarthria and anarthria: Secondary | ICD-10-CM | POA: Diagnosis present

## 2020-03-01 DIAGNOSIS — G8194 Hemiplegia, unspecified affecting left nondominant side: Secondary | ICD-10-CM | POA: Diagnosis present

## 2020-03-01 DIAGNOSIS — E785 Hyperlipidemia, unspecified: Secondary | ICD-10-CM | POA: Diagnosis present

## 2020-03-01 DIAGNOSIS — I739 Peripheral vascular disease, unspecified: Secondary | ICD-10-CM | POA: Diagnosis present

## 2020-03-01 DIAGNOSIS — J449 Chronic obstructive pulmonary disease, unspecified: Secondary | ICD-10-CM | POA: Diagnosis present

## 2020-03-01 DIAGNOSIS — Z885 Allergy status to narcotic agent status: Secondary | ICD-10-CM | POA: Diagnosis not present

## 2020-03-01 DIAGNOSIS — G479 Sleep disorder, unspecified: Secondary | ICD-10-CM | POA: Diagnosis present

## 2020-03-01 DIAGNOSIS — R29703 NIHSS score 3: Secondary | ICD-10-CM | POA: Diagnosis present

## 2020-03-01 LAB — CBC
HCT: 39.7 % (ref 36.0–46.0)
Hemoglobin: 12.8 g/dL (ref 12.0–15.0)
MCH: 26.2 pg (ref 26.0–34.0)
MCHC: 32.2 g/dL (ref 30.0–36.0)
MCV: 81.4 fL (ref 80.0–100.0)
Platelets: 268 10*3/uL (ref 150–400)
RBC: 4.88 MIL/uL (ref 3.87–5.11)
RDW: 13.5 % (ref 11.5–15.5)
WBC: 5.6 10*3/uL (ref 4.0–10.5)
nRBC: 0 % (ref 0.0–0.2)

## 2020-03-01 LAB — DIFFERENTIAL
Abs Immature Granulocytes: 0.01 10*3/uL (ref 0.00–0.07)
Basophils Absolute: 0 10*3/uL (ref 0.0–0.1)
Basophils Relative: 0 %
Eosinophils Absolute: 0.2 10*3/uL (ref 0.0–0.5)
Eosinophils Relative: 4 %
Immature Granulocytes: 0 %
Lymphocytes Relative: 28 %
Lymphs Abs: 1.6 10*3/uL (ref 0.7–4.0)
Monocytes Absolute: 0.3 10*3/uL (ref 0.1–1.0)
Monocytes Relative: 5 %
Neutro Abs: 3.5 10*3/uL (ref 1.7–7.7)
Neutrophils Relative %: 63 %

## 2020-03-01 LAB — PROTIME-INR
INR: 1 (ref 0.8–1.2)
Prothrombin Time: 13.1 seconds (ref 11.4–15.2)

## 2020-03-01 LAB — COMPREHENSIVE METABOLIC PANEL
ALT: 18 U/L (ref 0–44)
AST: 22 U/L (ref 15–41)
Albumin: 4 g/dL (ref 3.5–5.0)
Alkaline Phosphatase: 86 U/L (ref 38–126)
Anion gap: 7 (ref 5–15)
BUN: 16 mg/dL (ref 6–20)
CO2: 24 mmol/L (ref 22–32)
Calcium: 9.3 mg/dL (ref 8.9–10.3)
Chloride: 112 mmol/L — ABNORMAL HIGH (ref 98–111)
Creatinine, Ser: 0.99 mg/dL (ref 0.44–1.00)
GFR calc Af Amer: >60 mL/min (ref >60)
GFR calc non Af Amer: >60 mL/min (ref >60)
Glucose, Bld: 141 mg/dL — ABNORMAL HIGH (ref 70–99)
Potassium: 3.3 mmol/L — ABNORMAL LOW (ref 3.5–5.1)
Sodium: 143 mmol/L (ref 135–145)
Total Bilirubin: 0.4 mg/dL (ref 0.3–1.2)
Total Protein: 7.5 g/dL (ref 6.5–8.1)

## 2020-03-01 LAB — GLUCOSE, CAPILLARY: Glucose-Capillary: 96 mg/dL (ref 70–99)

## 2020-03-01 LAB — APTT: aPTT: 30 seconds (ref 24–36)

## 2020-03-01 LAB — POC SARS CORONAVIRUS 2 AG: SARS Coronavirus 2 Ag: NEGATIVE

## 2020-03-01 MED ORDER — LORAZEPAM 2 MG/ML IJ SOLN
1.0000 mg | Freq: Once | INTRAMUSCULAR | Status: AC | PRN
  Administered 2020-03-01: 1 mg via INTRAVENOUS
  Filled 2020-03-01: qty 1

## 2020-03-01 MED ORDER — IPRATROPIUM-ALBUTEROL 0.5-2.5 (3) MG/3ML IN SOLN: 3.0000 mL | RESPIRATORY_TRACT | Status: DC | PRN

## 2020-03-01 MED ORDER — STROKE: EARLY STAGES OF RECOVERY BOOK: Freq: Once | Status: DC

## 2020-03-01 MED ORDER — QUETIAPINE FUMARATE 25 MG PO TABS
25.0000 mg | ORAL_TABLET | Freq: Every day | ORAL | Status: DC
  Administered 2020-03-01 – 2020-03-03 (×3): 25 mg via ORAL
  Filled 2020-03-01 (×3): qty 1

## 2020-03-01 MED ORDER — DULOXETINE HCL 30 MG PO CPEP
30.0000 mg | ORAL_CAPSULE | Freq: Every day | ORAL | Status: DC
  Administered 2020-03-02 – 2020-03-04 (×3): 30 mg via ORAL
  Filled 2020-03-01 (×3): qty 1

## 2020-03-01 MED ORDER — ASPIRIN EC 81 MG PO TBEC
81.0000 mg | DELAYED_RELEASE_TABLET | Freq: Every day | ORAL | Status: DC
  Administered 2020-03-01: 81 mg via ORAL
  Filled 2020-03-01: qty 1

## 2020-03-01 MED ORDER — COMBIVENT 18-103 MCG/ACT IN AERO: 1.0000 | INHALATION_SPRAY | RESPIRATORY_TRACT | Status: DC | PRN

## 2020-03-01 MED ORDER — CLOPIDOGREL BISULFATE 75 MG PO TABS
300.0000 mg | ORAL_TABLET | Freq: Once | ORAL | Status: AC
  Administered 2020-03-01: 300 mg via ORAL
  Filled 2020-03-01: qty 4

## 2020-03-01 NOTE — H&P (Signed)
Elizabeth Coffey is an 59 y.o. female.   Chief Complaint: Left sided paresthesias, mild left sided weakness, and mild dysarthria/word finding difficulties.  HPI: The patient is a 59 yr old woman who carries a past medical history significant for CVA, depression, anxiety, arthritis, hypertension, peptic ulcer, cervical radiculopathy, and GERD. She lives alone in a boarding house. She presents with one day history of numbness of the left side of her body. She states that this is head to toe. She also admits to being more sleepy than usual for the last day. She states that she almost called 911 yesterday, but thought she would get better.   In the ED the patient is hypertensive with a normal temperature, heart rate, and respiratory rate. CT brain is negative for acute abnormality. Laboratory indices are for the most part within normal limits except for a potassium of 3.3 on the BMP. CBC is normal.  The patient denies fevers, chills, cough, shortness of breath, nausea, vomiting, constipation, diarrhea, rash, visual changes or lesions.  [facility] have been consulted to admit for further evaluation and treatment.  She has been admitted to a telemetry bed for a stroke work up.  Past Medical History:  Diagnosis Date  . Anxiety   . Arthritis   . Hypertension   . Peptic ulcer     Past Surgical History:  Procedure Laterality Date  . ABDOMINAL HYSTERECTOMY    . BREAST CYST EXCISION      Family History  Problem Relation Age of Onset  . Mental illness Maternal Uncle    Social History:  reports that she has never smoked. She has never used smokeless tobacco. She reports that she does not drink alcohol or use drugs. (Not in a hospital admission)   Allergies:  Allergies  Allergen Reactions  . Hydrocodone-Acetaminophen Anaphylaxis  . Aspirin Itching  . Mobic [Meloxicam] Other (See Comments)    Light headed and vomiting.   . Tramadol Other (See Comments)    Stomach pain   . Chocolate  Itching and Rash  . Cocoa Itching and Rash    Pertinent items noted in HPI and remainder of comprehensive ROS otherwise negative.   General appearance: alert, fatigued, no distress, mildly obese and slowed mentation Head: Normocephalic, without obvious abnormality, atraumatic Eyes: conjunctivae/corneas clear. PERRL, EOM's intact. Fundi benign. Throat: lips, mucosa, and tongue normal; teeth and gums normal Neck: no adenopathy, no carotid bruit, no JVD, supple, symmetrical, trachea midline and thyroid not enlarged, symmetric, no tenderness/mass/nodules Resp: No increased work of breathing. No wheezes, rales, or rhonchi. No tactile fremitus. Chest wall: no tenderness Cardio: regular rate and rhythm, S1, S2 normal, no murmur, click, rub or gallop GI: soft, non-tender; bowel sounds normal; no masses,  no organomegaly Extremities: extremities normal, atraumatic, no cyanosis or edema Pulses: 2+ and symmetric Skin: Skin color, texture, turgor normal. No rashes or lesions Lymph nodes: Cervical, supraclavicular, and axillary nodes normal. Neurologic: The patient is awake, alert, and oriented x 3. She is communicative and able to give a fair history. Her speech is somewhat thick and she occasionally has difficulty finding her words.  Results for orders placed or performed during the hospital encounter of 03/01/20 (from the past 48 hour(s))  Protime-INR     Status: None   Collection Time: 03/01/20  2:00 PM  Result Value Ref Range   Prothrombin Time 13.1 11.4 - 15.2 seconds   INR 1.0 0.8 - 1.2    Comment: (NOTE) INR goal varies based on device  and disease states. Performed at [facility], [address] [place], [place] [address]   APTT     Status: None   Collection Time: 03/01/20  2:00 PM  Result Value Ref Range   aPTT 30 24 - 36 seconds    Comment: Performed at [facility], [address] [place], [place] [address]  CBC     Status: None   Collection Time: 03/01/20   2:00 PM  Result Value Ref Range   WBC 5.6 4.0 - 10.5 K/uL   RBC 4.88 3.87 - 5.11 MIL/uL   Hemoglobin 12.8 12.0 - 15.0 g/dL   HCT 74.2 59.5 - 63.8 %   MCV 81.4 80.0 - 100.0 fL   MCH 26.2 26.0 - 34.0 pg   MCHC 32.2 30.0 - 36.0 g/dL   RDW 75.6 43.3 - 29.5 %   Platelets 268 150 - 400 K/uL   nRBC 0.0 0.0 - 0.2 %    Comment: Performed at [facility], [address] [place], [place] [address]  Differential     Status: None   Collection Time: 03/01/20  2:00 PM  Result Value Ref Range   Neutrophils Relative % 63 %   Neutro Abs 3.5 1.7 - 7.7 K/uL   Lymphocytes Relative 28 %   Lymphs Abs 1.6 0.7 - 4.0 K/uL   Monocytes Relative 5 %   Monocytes Absolute 0.3 0.1 - 1.0 K/uL   Eosinophils Relative 4 %   Eosinophils Absolute 0.2 0.0 - 0.5 K/uL   Basophils Relative 0 %   Basophils Absolute 0.0 0.0 - 0.1 K/uL   Immature Granulocytes 0 %   Abs Immature Granulocytes 0.01 0.00 - 0.07 K/uL    Comment: Performed at [facility], [address] [place], [place] [address]  Comprehensive metabolic panel     Status: Abnormal   Collection Time: 03/01/20  2:00 PM  Result Value Ref Range   Sodium 143 135 - 145 mmol/L   Potassium 3.3 (L) 3.5 - 5.1 mmol/L   Chloride 112 (H) 98 - 111 mmol/L   CO2 24 22 - 32 mmol/L   Glucose, Bld 141 (H) 70 - 99 mg/dL    Comment: Glucose reference range applies only to samples taken after fasting for at least 8 hours.   BUN 16 6 - 20 mg/dL   Creatinine, Ser 0.16 0.44 - 1.00 mg/dL   Calcium 9.3 8.9 - 01.0 mg/dL   Total Protein 7.5 6.5 - 8.1 g/dL   Albumin 4.0 3.5 - 5.0 g/dL   AST 22 15 - 41 U/L   ALT 18 0 - 44 U/L   Alkaline Phosphatase 86 38 - 126 U/L   Total Bilirubin 0.4 0.3 - 1.2 mg/dL   GFR calc non Af Amer >60 >60 mL/min   GFR calc Af Amer >60 >60 mL/min   Anion gap 7 5 - 15    Comment: Performed at [facility], [address] [place], [place] [address]  Glucose, capillary     Status: None   Collection Time: 03/01/20  5:20 PM   Result Value Ref Range   Glucose-Capillary 96 70 - 99 mg/dL    Comment: Glucose reference range applies only to samples taken after fasting for at least 8 hours.   @[id number] @  Blood pressure (!) 189/92, pulse 66, temperature 98 F (36.7 C), temperature source Oral, resp. rate 20, height 5\' 4"  (1.626 m), weight 93.4 kg, SpO2 99 %.   Assessment/Plan Problem  Lethargy  Numbness and Tingling of Left Arm and Leg  Poorly-Controlled Hypertension  Depression With Anxiety  Essential Hypertension  Gastroesophageal Reflux Disease Without Esophagitis   Acute CVA: CT head negative for acute abnormality. However the patient's presentation is suspicious for an acute CVA with increased lethargy, word finding difficulty, and left sided paresthesias. She has received aspirin and plavix 300 mg in the ED. The patient will be admitted to a telemetry bed. We will take a liberal approach to the patient's hypertension. She will have an MRI, carotid dopplers and echocardiogram. PT/OT will be asked to evaluate and treat the patient.  Memory deficits: The patient states that lately she has had difficulty remembering small things such as leaving items cooking on the stovetop. This is somewhat concerning for dementia. This will need to be further evaluated as outpatient when the patient is neurologically back at baseline.  Hypertension: Uncontrolled. Systolic pressures in the ED have run from 171 - 194. Diastolic pressures have run from 92 - 101. We will take a liberal approach to the patient's blood pressures for the next 48 hours.   Depression and anxiety: Continue cymbalta, pamelor.  COPD: Noted and stable. Combivent inhaler to be used Q6H prn wheezing.  I have seen and examined this patient myself. I have spent 35 minutes in her evaluation and care.  CODE STATUS: Full Code DVT Prophylaxis: Heparin Family communication: None available Disposition: tbd  Treina Arscott 03/01/2020, 7:24 PM

## 2020-03-01 NOTE — ED Notes (Signed)
Report to Adrienne, RN.

## 2020-03-01 NOTE — ED Triage Notes (Signed)
FIRST NURSE NOTE- pt here for left side numbness to whole body. Hx CVA. Sx started at noon yesterday. Ambulatory. NAD

## 2020-03-01 NOTE — ED Notes (Signed)
EDP aware of pt trending BP. No further orders at this time.

## 2020-03-01 NOTE — ED Notes (Signed)
Pt on phone with MRI for screening questions  

## 2020-03-01 NOTE — ED Provider Notes (Signed)
[facility] Emergency Department Provider Note  ____________________________________________  Time seen: Approximately 3:38 PM  I have reviewed the triage vital signs and the nursing notes.   HISTORY  Chief Complaint Stroke Symptoms    HPI Elizabeth Coffey is a 59 y.o. female with a history of hypertension, strokes who comes the ED complaining of left-sided numbness and tingling that started yesterday morning, constant, no aggravating or alleviating factors.  Also associated with a feeling of off balance when she walks.  She reports a fall about a month ago at her residence after which she did not seek care.  Denies chest pain palpitations or shortness of breath.  No thunderclap headache or vision changes.      Past Medical History:  Diagnosis Date  . Anxiety   . Arthritis   . Hypertension   . Peptic ulcer      Patient Active Problem List   Diagnosis Date Noted  . Numbness and tingling of left arm and leg 08/24/2018  . Sleep disturbance 08/24/2018  . Chest pain at rest 07/11/2018  . Headache disorder 06/10/2018  . Poorly-controlled hypertension 03/25/2018  . Cervical radiculopathy 01/31/2018  . Hemorrhoids 12/18/2017  . Mild carpal tunnel syndrome of right wrist 12/18/2017  . Depression with anxiety 09/27/2017  . Cervical muscle strain 09/23/2016  . Degenerative disc disease, lumbar 09/23/2016  . Essential hypertension 09/23/2016  . Gastroesophageal reflux disease without esophagitis 09/23/2016  . Abnormal mammogram of left breast 01/06/2016     Past Surgical History:  Procedure Laterality Date  . ABDOMINAL HYSTERECTOMY    . BREAST CYST EXCISION       Prior to Admission medications   Medication Sig Start Date End Date Taking? Authorizing Provider  acyclovir (ZOVIRAX) 800 MG tablet Take 1 tablet (800 mg total) by mouth 3 (three) times daily. 10/26/19   Fisher, Linden Dolin, PA-C  albuterol-ipratropium (COMBIVENT) 18-103 MCG/ACT inhaler Inhale 1  puff into the lungs every 4 (four) hours as needed for wheezing or shortness of breath. 09/29/15   Beers, Pierce Crane, PA-C  amLODipine-benazepril (LOTREL) 10-20 MG capsule Take 1 capsule by mouth daily. 08/13/16   Schuyler Amor, MD  aspirin EC 81 MG tablet Take by mouth.    [provider]  cloNIDine (CATAPRES) 0.2 MG tablet TAKE 1 TABLET (0.2 MG TOTAL) BY MOUTH 2 (TWO) TIMES DAILY 08/08/18   [provider]  cyclobenzaprine (FLEXERIL) 10 MG tablet Take 1 tablet (10 mg total) by mouth 3 (three) times daily as needed for muscle spasms. 05/15/17   Triplett, Cari B, FNP  DULoxetine (CYMBALTA) 30 MG capsule TAKE 1 CAPSULE (30 MG TOTAL) BY MOUTH DAILY. FOR MOOD 11/18/18   Ursula Alert, MD  guaiFENesin-codeine 100-10 MG/5ML syrup Take 10 mLs by mouth every 4 (four) hours as needed for cough. 09/29/15   Beers, Pierce Crane, PA-C  hydrochlorothiazide (HYDRODIURIL) 25 MG tablet Take by mouth. 06/10/18 06/10/19  [provider]  linaclotide Rolan Lipa) 290 MCG CAPS capsule Take by mouth.    [provider]  meloxicam (MOBIC) 15 MG tablet Take 15 mg by mouth daily.    [provider]  methylPREDNISolone (MEDROL DOSEPAK) 4 MG TBPK tablet Take 6 pills on day one then decrease by 1 pill each day 10/26/19   Versie Starks, PA-C  metoprolol succinate (TOPROL-XL) 50 MG 24 hr tablet TAKE 1 AND 1/2 TABLETS (75 MG TOTAL) BY MOUTH ONCE DAILY 06/14/18   [provider]  nortriptyline (PAMELOR) 10 MG capsule  Take 2 capsules (20 mg total) by mouth at bedtime. 10/26/18   Jomarie Longs, MD  omeprazole (PRILOSEC) 40 MG capsule Take 40 mg by mouth daily.    [provider]  pseudoephedrine (SUDAFED) 60 MG tablet Take 1 tablet (60 mg total) by mouth every 4 (four) hours as needed for congestion. 09/29/15   Beers, Charmayne Sheer, PA-C  QUEtiapine (SEROQUEL) 25 MG tablet TAKE 1 TABLET (25 MG TOTAL) BY MOUTH AT BEDTIME. FOR MOOD AND SLEEP 11/18/18   Jomarie Longs, MD  tiZANidine  (ZANAFLEX) 2 MG tablet 1-2 tablets at bedtime as needed for muscle spasm. 08/08/18   [provider]  triamcinolone (KENALOG) 0.025 % ointment Apply 1 application topically 2 (two) times daily. 10/26/19   Fisher, Roselyn Bering, PA-C  triamterene-hydrochlorothiazide (MAXZIDE-25) 37.5-25 MG tablet Take 1 tablet by mouth daily. 08/13/16   Jeanmarie Plant, MD     Allergies Hydrocodone-acetaminophen, Aspirin, Mobic [meloxicam], Tramadol, Chocolate, and Cocoa   Family History  Problem Relation Age of Onset  . Mental illness Maternal Uncle     Social History Social History   Tobacco Use  . Smoking status: Never Smoker  . Smokeless tobacco: Never Used  Substance Use Topics  . Alcohol use: No  . Drug use: No    Review of Systems  Constitutional:   No fever or chills.  ENT:   No sore throat. No rhinorrhea. Cardiovascular:   No chest pain or syncope. Respiratory:   No dyspnea or cough. Gastrointestinal:   Negative for abdominal pain, vomiting and diarrhea.  Musculoskeletal:   Negative for focal pain or swelling All other systems reviewed and are negative except as documented above in ROS and HPI.  ____________________________________________   PHYSICAL EXAM:  VITAL SIGNS: ED Triage Vitals  Enc Vitals Group     BP 03/01/20 1349 (!) 171/94     Pulse Rate 03/01/20 1349 71     Resp 03/01/20 1349 16     Temp 03/01/20 1349 98.5 F (36.9 C)     Temp Source 03/01/20 1349 Oral     SpO2 03/01/20 1349 99 %     Weight 03/01/20 1350 206 lb (93.4 kg)     Height 03/01/20 1350 5\' 4"  (1.626 m)     Head Circumference --      Peak Flow --      Pain Score 03/01/20 1349 4     Pain Loc --      Pain Edu? --      Excl. in GC? --     Vital signs reviewed, nursing assessments reviewed.   Constitutional:   Alert and oriented. Non-toxic appearance. Eyes:   Conjunctivae are normal. EOMI. PERRL. ENT      Head:   Normocephalic and atraumatic.      Nose:   Wearing a mask.       Mouth/Throat:   Wearing a mask.      Neck:   No meningismus. Full ROM. Hematological/Lymphatic/Immunilogical:   No cervical lymphadenopathy. Cardiovascular:   RRR. Symmetric bilateral radial and DP pulses.  No murmurs. Cap refill less than 2 seconds. Respiratory:   Normal respiratory effort without tachypnea/retractions. Breath sounds are clear and equal bilaterally. No wheezes/rales/rhonchi. Gastrointestinal:   Soft and nontender. Non distended. There is no CVA tenderness.  No rebound, rigidity, or guarding. Genitourinary:   deferred Musculoskeletal:   Normal range of motion in all extremities. No joint effusions.  No lower extremity tenderness.  No edema. Neurologic:   Normal speech and  language.  Slight left lower extremity weakness and drift compared to right. Diminished sensation diffusely in the left arm and left leg NIH stroke scale 3 Skin:    Skin is warm, dry and intact. No rash noted.  No petechiae, purpura, or bullae.  ____________________________________________    LABS (pertinent positives/negatives) (all labs ordered are listed, but only abnormal results are displayed) Labs Reviewed  COMPREHENSIVE METABOLIC PANEL - Abnormal; Notable for the following components:      Result Value   Potassium 3.3 (*)    Chloride 112 (*)    Glucose, Bld 141 (*)    All other components within normal limits  PROTIME-INR  APTT  CBC  DIFFERENTIAL  CBG MONITORING, ED  POC URINE PREG, ED   ____________________________________________   EKG  Interpreted by me Normal sinus rhythm rate of 68, normal axis and intervals.  Poor R wave progression.  Normal ST segments.  Slight T wave inversions in lateral leads.  Appears new compared to previous EKG September 2020.  ____________________________________________    RADIOLOGY  CT HEAD WO CONTRAST  Result Date: 03/01/2020 CLINICAL DATA:  Left-sided numbness. EXAM: CT HEAD WITHOUT CONTRAST TECHNIQUE: Contiguous axial images were obtained  from the base of the skull through the vertex without intravenous contrast. COMPARISON:  December 02, 2017. FINDINGS: Brain: Mild chronic ischemic white matter disease. No mass effect or midline shift is noted. Ventricular size is within normal limits. There is no evidence of mass lesion, hemorrhage or acute infarction. Vascular: No hyperdense vessel or unexpected calcification. Skull: Normal. Negative for fracture or focal lesion. Sinuses/Orbits: No acute finding. Other: None. IMPRESSION: Mild chronic ischemic white matter disease. No acute intracranial abnormality seen. Electronically Signed   By: Lupita Raider M.D.   On: 03/01/2020 14:40    ____________________________________________   PROCEDURES Procedures  ____________________________________________  DIFFERENTIAL DIAGNOSIS   Intracranial hemorrhage, ischemic stroke, electrolyte abnormality, nstemi  CLINICAL IMPRESSION / ASSESSMENT AND PLAN / ED COURSE  Medications ordered in the ED: Medications  clopidogrel (PLAVIX) tablet 300 mg (has no administration in time range)  LORazepam (ATIVAN) injection 1 mg (has no administration in time range)    Pertinent labs & imaging results that were available during my care of the patient were reviewed by me and considered in my medical decision making (see chart for details).  Elizabeth Coffey was evaluated in Emergency Department on 03/01/2020 for the symptoms described in the history of present illness. She was evaluated in the context of the global COVID-19 pandemic, which necessitated consideration that the patient might be at risk for infection with the SARS-CoV-2 virus that causes COVID-19. Institutional protocols and algorithms that pertain to the evaluation of patients at risk for COVID-19 are in a state of rapid change based on information released by regulatory bodies including the CDC and federal and state organizations. These policies and algorithms were followed during the patient's care in  the ED.   Patient presents with left-sided numbness and tingling, also found to have slight left leg weakness on exam.  Cranial nerves are intact, not in distress.  Symptoms are subacute and outside of the window for TPA or neuro IR intervention.  CT head negative, will plan to hospitalize for further stroke work-up. Has reported aspirin sensitivity, so i'll give plavix for anti-platelet tx      ____________________________________________   FINAL CLINICAL IMPRESSION(S) / ED DIAGNOSES    Final diagnoses:  Paresthesia of left arm and leg  Acute ischemic stroke (HCC)  ED Discharge Orders    None      Portions of this note were generated with dragon dictation software. Dictation errors may occur despite best attempts at proofreading.   Sharman Cheek, MD 03/01/20 [phone]

## 2020-03-01 NOTE — ED Triage Notes (Signed)
Patient presents to the ED for left sided numbness that began yesterday at noon.  Patient does not have obvious facial droop when smiling but seems to have slight left sided droop while speaking.  Patient states, "even half of my back feels numb."  Patient reports history of stroke.  Patient is occasionally having difficulty speaking but is speaking clearly in full sentences for the most part.  Occasionally has to pause to get her thoughts together.

## 2020-03-02 ENCOUNTER — Encounter: Payer: Self-pay | Admitting: Internal Medicine

## 2020-03-02 ENCOUNTER — Inpatient Hospital Stay: Payer: Medicare Other

## 2020-03-02 DIAGNOSIS — I639 Cerebral infarction, unspecified: Secondary | ICD-10-CM

## 2020-03-02 LAB — CBC
HCT: 37.1 % (ref 36.0–46.0)
Hemoglobin: 12 g/dL (ref 12.0–15.0)
MCH: 26.2 pg (ref 26.0–34.0)
MCHC: 32.3 g/dL (ref 30.0–36.0)
MCV: 81 fL (ref 80.0–100.0)
Platelets: 259 10*3/uL (ref 150–400)
RBC: 4.58 MIL/uL (ref 3.87–5.11)
RDW: 13.5 % (ref 11.5–15.5)
WBC: 6 10*3/uL (ref 4.0–10.5)
nRBC: 0 % (ref 0.0–0.2)

## 2020-03-02 LAB — SARS CORONAVIRUS 2 (TAT 6-24 HRS): SARS Coronavirus 2: NEGATIVE

## 2020-03-02 LAB — HEMOGLOBIN A1C
Hgb A1c MFr Bld: 6.1 % — ABNORMAL HIGH (ref 4.8–5.6)
Mean Plasma Glucose: 128.37 mg/dL

## 2020-03-02 LAB — BASIC METABOLIC PANEL WITH GFR
Anion gap: 7 (ref 5–15)
BUN: 14 mg/dL (ref 6–20)
CO2: 24 mmol/L (ref 22–32)
Calcium: 8.9 mg/dL (ref 8.9–10.3)
Chloride: 114 mmol/L — ABNORMAL HIGH (ref 98–111)
Creatinine, Ser: 0.81 mg/dL (ref 0.44–1.00)
GFR calc Af Amer: >60 mL/min (ref >60)
GFR calc non Af Amer: >60 mL/min (ref >60)
Glucose, Bld: 97 mg/dL (ref 70–99)
Potassium: 3 mmol/L — ABNORMAL LOW (ref 3.5–5.1)
Sodium: 145 mmol/L (ref 135–145)

## 2020-03-02 LAB — LIPID PANEL
Cholesterol: 132 mg/dL (ref 0–200)
HDL: 38 mg/dL — ABNORMAL LOW (ref >40)
LDL Cholesterol: 74 mg/dL (ref 0–99)
Total CHOL/HDL Ratio: 3.5 RATIO
Triglycerides: 101 mg/dL (ref <150)
VLDL: 20 mg/dL (ref 0–40)

## 2020-03-02 MED ORDER — LABETALOL HCL 5 MG/ML IV SOLN
5.0000 mg | INTRAVENOUS | Status: DC | PRN
  Administered 2020-03-02 – 2020-03-03 (×4): 5 mg via INTRAVENOUS
  Filled 2020-03-02 (×4): qty 4

## 2020-03-02 MED ORDER — IOHEXOL 350 MG/ML SOLN
75.0000 mL | Freq: Once | INTRAVENOUS | Status: AC | PRN
  Administered 2020-03-02: 75 mL via INTRAVENOUS

## 2020-03-02 MED ORDER — POTASSIUM CHLORIDE 10 MEQ/100ML IV SOLN
10.0000 meq | INTRAVENOUS | Status: DC
  Administered 2020-03-02: 10 meq via INTRAVENOUS
  Filled 2020-03-02 (×4): qty 100

## 2020-03-02 MED ORDER — TRAMADOL HCL 50 MG PO TABS
50.0000 mg | ORAL_TABLET | Freq: Four times a day (QID) | ORAL | Status: DC | PRN
  Administered 2020-03-02 – 2020-03-04 (×5): 50 mg via ORAL
  Filled 2020-03-02 (×5): qty 1

## 2020-03-02 MED ORDER — ALUM & MAG HYDROXIDE-SIMETH 200-200-20 MG/5ML PO SUSP
15.0000 mL | ORAL | Status: DC | PRN
  Administered 2020-03-02: 15 mL via ORAL
  Filled 2020-03-02: qty 30

## 2020-03-02 MED ORDER — POTASSIUM CHLORIDE CRYS ER 20 MEQ PO TBCR
40.0000 meq | EXTENDED_RELEASE_TABLET | Freq: Once | ORAL | Status: AC
  Administered 2020-03-02: 40 meq via ORAL
  Filled 2020-03-02: qty 2

## 2020-03-02 MED ORDER — KETOROLAC TROMETHAMINE 30 MG/ML IJ SOLN
30.0000 mg | Freq: Once | INTRAMUSCULAR | Status: AC
  Administered 2020-03-02: 30 mg via INTRAVENOUS
  Filled 2020-03-02: qty 1

## 2020-03-02 MED ORDER — ASPIRIN EC 325 MG PO TBEC
325.0000 mg | DELAYED_RELEASE_TABLET | Freq: Every day | ORAL | Status: DC
  Administered 2020-03-03 – 2020-03-04 (×2): 325 mg via ORAL
  Filled 2020-03-02 (×2): qty 1

## 2020-03-02 MED ORDER — ACETAMINOPHEN 325 MG PO TABS: 650.0000 mg | ORAL_TABLET | Freq: Four times a day (QID) | ORAL | Status: DC | PRN

## 2020-03-02 MED ORDER — SODIUM CHLORIDE 0.9% FLUSH
3.0000 mL | Freq: Two times a day (BID) | INTRAVENOUS | Status: DC
  Administered 2020-03-02 – 2020-03-03 (×3): 3 mL via INTRAVENOUS

## 2020-03-02 MED ORDER — ENOXAPARIN SODIUM 40 MG/0.4ML ~~LOC~~ SOLN
40.0000 mg | SUBCUTANEOUS | Status: DC
  Administered 2020-03-02 – 2020-03-04 (×3): 40 mg via SUBCUTANEOUS
  Filled 2020-03-02 (×3): qty 0.4

## 2020-03-02 NOTE — Progress Notes (Signed)
PROGRESS NOTE  MIAMI LATULIPPE GQQ:[id number] DOB: 1961/02/22 DOA: 03/01/2020 PCP: Lauro Regulus, MD  Brief History   The patient is a 59 yr old woman who carries a past medical history significant for CVA, depression, anxiety, arthritis, hypertension, peptic ulcer, cervical radiculopathy, and GERD. She lives alone in a boarding house. She presents with one day history of numbness of the left side of her body. She states that this is head to toe. She also admits to being more sleepy than usual for the last day. She states that she almost called 911 yesterday, but thought she would get better.   In the ED the patient is hypertensive with a normal temperature, heart rate, and respiratory rate. CT brain is negative for acute abnormality. Laboratory indices are for the most part within normal limits except for a potassium of 3.3 on the BMP. CBC is normal.  The patient denies fevers, chills, cough, shortness of breath, nausea, vomiting, constipation, diarrhea, rash, visual changes or lesions.  [facility] have been consulted to admit for further evaluation and treatment.  She has been admitted to a telemetry bed for a stroke work up.  MRI Brain has demonstrated right thalamic infarct. She is receiving plavix and aspirin. Echocardiogram and Carotid ultrasound is pending. Will also order CTA head and neck.  Consultants  . Neurology  Procedures  . None  Antibiotics   Anti-infectives (From admission, onward)   None    .  Subjective  The patient is resting comfortably. She feels that her speech has improved. She continues to have the numbness.  Objective   Vitals:  Vitals:   03/02/20 1548 03/02/20 1600  BP: (!) 210/110 (!) 217/123  Pulse: 86   Resp:    Temp:    SpO2:     General appearance: Alert, awake, and oriented x 3.   No acute distress.  Eyes:  Conjunctivae/corneas clear. PERRLA, EOM's intact. Fundi benign.  Resp:  No increased work of breathing  No wheezes,  rales, or rhonchi.   No tactile fremitus.  Cardio:Regular rate and rhythm  No murmurs, ectopy, or galllups  No lateral PMI. No thrills. GI:  Soft, non-tender, non-distended  Bowel sounds normal  No masses,  Organomegaly, or hernias are appreciated  Extremities: extremities normal, atraumatic, no cyanosis or edema  Skin:  Skin color, texture, turgor normal. No rashes or lesions  Neurologic: The patient is awake, alert, and oriented x 3. She is communicative and able to give a fair history. Her speech is somewhat thick and she occasionally has difficulty finding her words. CN II - XII grossly intact. Moving all extremities. Continues numbness of left side.  I have personally reviewed the following:   Today's Data  . Vitals, CBC, BMP, lipid panel   Imaging  . CT brain . MRI brain  Scheduled Meds: .  stroke: mapping our early stages of recovery book   Does not apply Once  . [START ON 03/03/2020] aspirin EC  325 mg Oral Daily  . DULoxetine  30 mg Oral Daily  . enoxaparin (LOVENOX) injection  40 mg Subcutaneous Q24H  . QUEtiapine  25 mg Oral QHS   Continuous Infusions:  Active Problems:   Depression with anxiety   Essential hypertension   Gastroesophageal reflux disease without esophagitis   Numbness and tingling of left arm and leg   Poorly-controlled hypertension   Lethargy   Stroke (HCC)   LOS: 1 day   A & P  Acute CVA: 8 mm right  thalimic infarct. She has been admitted to a telemetry bed. She is receiving aspirin and plavix. We have adopted a liberal approach to the patient's hypertension. She will have carotid dopplers, CTA head and neck, and echocardiogram. PT/OT will be asked to evaluate and treat the patient.  Memory deficits: The patient states that lately she has had difficulty remembering small things such as leaving items cooking on the stovetop. This is somewhat concerning for dementia. This will need to be further evaluated as outpatient when the patient is  neurologically back at baseline.  Hypertension: Uncontrolled. Systolic pressures in the ED have run from 171 - 194. Diastolic pressures have run from 92 - 101. We will take a liberal approach to the patient's blood pressures for the next 48 hours.   Depression and anxiety: Continue cymbalta, pamelor.  COPD: Noted and stable. Combivent inhaler to be used Q6H prn wheezing.  I have seen and examined this patient myself. I have spent 38 minutes in her evaluation and care.  CODE STATUS: Full Code DVT Prophylaxis: Heparin Family communication: None available Disposition: tbd   Zamariah Seaborn, DO [facility] Direct contact: see [email or web address]  7PM-7AM contact night coverage as above 03/02/2020, 5:49 PM  LOS: 1 day

## 2020-03-02 NOTE — Progress Notes (Addendum)
    BRIEF OVERNIGHT PROGRESS REPORT  SUBJECTIVE: Follow up abnormal MRI brain showing acute right thalamic infarct.  OBJECTIVE:On arrival to the ED, he was afebrile with blood pressure 187/93 mm Hg and pulse rate 59 beats/min. There were mild focal neurological deficits; she was alert and oriented x4, with mild memory deficits.   Focused Neurological Exam: Patient awake, oriented x 3. Follows commands. Mild Dysarthria. Word finding difficulty. II: Discs flat bilaterally; Visual fields grossly normal, pupils equal, round, reactive to light and accommodation III,IV, VI: ptosis not present, extra-ocular motions intact bilaterally V,VII: smile symmetric, facial light touch sensation decreased on the left VIII: hearing normal bilaterally IX,X: gag reflex present XI: bilateral shoulder shrug XII: Tongue midline Motor: Left upper extremity with 5/5 strength. Left lower extremity 5/5.  Sensory: Decreased sensation on the left side of the body Gait: not tested due to safety  ASSESSMENT: 59 yr old woman who carries a past medical history significant for CVA, depression, anxiety, arthritis, hypertension, peptic ulcer, cervical radiculopathy, and GERD with c/o Left sided paresthesias, mild left sided weakness, and mild dysarthria/word finding difficulties  PLAN: 1. Acute right thalamic infarct- Etiology likely due to small vessel disease in a patient with hx of HTN. Presenting with accelerated BP on admission. - HgbA1c, fasting lipid panel pending - MRI  of the brain without contrast reviewed and shows acute tight thalamic infarct. - PT consult, OT consult, Speech consult - Echocardiogram pending - Continue medical management with dual therapy Aspirin 81 mg/day and Plavix 75 mg /day  - NPO until RN stroke swallow screen -Telemetry monitoring - Frequent neuro checks - Neurology consult placed. Message sent via Haiku to Dr. Loretha Brasil  3. Hypertension - Permissive hypertension for 24-48 hours  post stroke  - Long term Goal BP <140/90 - continue home bp meds  4. Hyperlipidemia - check lipids - LDL goal < 70  5. Cognitive deficit - likely post stroke related in a patient with prior hx of strokes and possible undiagnosed OSA. Concerns for vascular dementia - Follows with outpatient Neurologist Dr. Malvin Johns  5. DVT prophylaxis - heparin SubQ     Webb Silversmith, DNP, CCRN, FNP-C [facility] Nurse Practitioner Between 7pm to 7am - Pager [phone]  After 7am go to [email or web address] - password:[email or web address] select [facility]  [employer]  [phone]

## 2020-03-02 NOTE — Consult Note (Signed)
Reason for Consult: L sided weakness Requesting Physician: Dr. Gerri Lins  CC:  R sided weakness    HPI: Elizabeth Coffey is an 59 y.o. female with past medical history significant for CVA, depression, anxiety, arthritis, hypertension, peptic ulcer, cervical radiculopathy, and GERD with c/o Left sided paresthesias, mild left sided weakness, and mild dysarthria/word finding difficulties. She is s/p MRI with R thalamic stroke.  Pt was on ASA 81mg  at home and started on dual anti platelet therapy in the hospital.  As per patient was only taking ASA 3x/week.   Past Medical History:  Diagnosis Date  . Anxiety   . Arthritis   . Hypertension   . Peptic ulcer     Past Surgical History:  Procedure Laterality Date  . ABDOMINAL HYSTERECTOMY    . BREAST CYST EXCISION      Family History  Problem Relation Age of Onset  . Mental illness Maternal Uncle     Social History:  reports that she has never smoked. She has never used smokeless tobacco. She reports that she does not drink alcohol or use drugs.  Allergies  Allergen Reactions  . Hydrocodone-Acetaminophen Anaphylaxis  . Aspirin Itching  . Mobic [Meloxicam] Other (See Comments)    Light headed and vomiting.   . Tramadol Other (See Comments)    Stomach pain   . Chocolate Itching and Rash  . Cocoa Itching and Rash    Medications: I have reviewed the patient's current medications.  ROS: History obtained from the patient  General ROS: negative for - chills, fatigue, fever, night sweats, weight gain or weight loss Psychological ROS: negative for - behavioral disorder, hallucinations, memory difficulties, mood swings or suicidal ideation Ophthalmic ROS: negative for - blurry vision, double vision, eye pain or loss of vision ENT ROS: negative for - epistaxis, nasal discharge, oral lesions, sore throat, tinnitus or vertigo Allergy and Immunology ROS: negative for - hives or itchy/watery eyes Hematological and Lymphatic ROS: negative for -  bleeding problems, bruising or swollen lymph nodes Endocrine ROS: negative for - galactorrhea, hair pattern changes, polydipsia/polyuria or temperature intolerance Respiratory ROS: negative for - cough, hemoptysis, shortness of breath or wheezing Cardiovascular ROS: negative for - chest pain, dyspnea on exertion, edema or irregular heartbeat Gastrointestinal ROS: negative for - abdominal pain, diarrhea, hematemesis, nausea/vomiting or stool incontinence Genito-Urinary ROS: negative for - dysuria, hematuria, incontinence or urinary frequency/urgency Musculoskeletal ROS: negative for - joint swelling or muscular weakness Neurological ROS: as noted in HPI Dermatological ROS: negative for rash and skin lesion changes  Physical Examination: Blood pressure (!) 171/90, pulse 62, temperature 97.7 F (36.5 C), temperature source Oral, resp. rate 17, height 5\' 4"  (1.626 m), weight 93.3 kg, SpO2 99 %.   Neurological Examination   Mental Status: Alert, oriented, thought content appropriate.  Speech fluent without evidence of aphasia.  Able to follow 3 step commands without difficulty. Cranial Nerves: II: Discs flat bilaterally; Visual fields grossly normal, pupils equal, round, reactive to light and accommodation III,IV, VI: ptosis not present, extra-ocular motions intact bilaterally V,VII: smile symmetric, facial light touch sensation normal bilaterally VIII: hearing normal bilaterally IX,X: gag reflex present XI: bilateral shoulder shrug XII: midline tongue extension Motor: Right : Upper extremity   5/5    Left:     Upper extremity   5/5  Lower extremity   5/5     Lower extremity   5/5 Tone and bulk:normal tone throughout; no atrophy noted Sensory: L sided numbness Deep Tendon Reflexes: 1+ and symmetric  throughout Plantars: Right: downgoing   Left: downgoing Cerebellar: normal finger-to-nose, normal rapid alternating movements and normal heel-to-shin test Gait: not tested       Laboratory Studies:   Basic Metabolic Panel: Recent Labs  Lab 03/01/20 1400 03/02/20 0448  NA 143 145  K 3.3* 3.0*  CL 112* 114*  CO2 24 24  GLUCOSE 141* 97  BUN 16 14  CREATININE 0.99 0.81  CALCIUM 9.3 8.9    Liver Function Tests: Recent Labs  Lab 03/01/20 1400  AST 22  ALT 18  ALKPHOS 86  BILITOT 0.4  PROT 7.5  ALBUMIN 4.0   No results for input(s): LIPASE, AMYLASE in the last 168 hours. No results for input(s): AMMONIA in the last 168 hours.  CBC: Recent Labs  Lab 03/01/20 1400 03/02/20 0448  WBC 5.6 6.0  NEUTROABS 3.5  --   HGB 12.8 12.0  HCT 39.7 37.1  MCV 81.4 81.0  PLT 268 259    Cardiac Enzymes: No results for input(s): CKTOTAL, CKMB, CKMBINDEX, TROPONINI in the last 168 hours.  BNP: Invalid input(s): POCBNP  CBG: Recent Labs  Lab 03/01/20 1720  GLUCAP 96    Microbiology: Results for orders placed or performed during the hospital encounter of 03/01/20  SARS CORONAVIRUS 2 (TAT 6-24 HRS) Nasopharyngeal Nasopharyngeal Swab     Status: None   Collection Time: 03/01/20  3:52 PM   Specimen: Nasopharyngeal Swab  Result Value Ref Range Status   SARS Coronavirus 2 NEGATIVE NEGATIVE Final    Comment: (NOTE) SARS-CoV-2 target nucleic acids are NOT DETECTED. The SARS-CoV-2 RNA is generally detectable in upper and lower respiratory specimens during the acute phase of infection. Negative results do not preclude SARS-CoV-2 infection, do not rule out co-infections with other pathogens, and should not be used as the sole basis for treatment or other patient management decisions. Negative results must be combined with clinical observations, patient history, and epidemiological information. The expected result is Negative. Fact Sheet for Patients: HairSlick.no Fact Sheet for Healthcare Providers: quierodirigir.com This test is not yet approved or cleared by the [place] FDA and  has  been authorized for detection and/or diagnosis of SARS-CoV-2 by FDA under an Emergency Use Authorization (EUA). This EUA will remain  in effect (meaning this test can be used) for the duration of the COVID-19 declaration under Section 56 4(b)(1) of the Act, 21 U.S.C. section 360bbb-3(b)(1), unless the authorization is terminated or revoked sooner. Performed at [facility], [address], [place], [place] [address]     Coagulation Studies: Recent Labs    03/01/20 1400  LABPROT 13.1  INR 1.0    Urinalysis: No results for input(s): COLORURINE, LABSPEC, PHURINE, GLUCOSEU, HGBUR, BILIRUBINUR, KETONESUR, PROTEINUR, UROBILINOGEN, NITRITE, LEUKOCYTESUR in the last 168 hours.  Invalid input(s): APPERANCEUR  Lipid Panel:     Component Value Date/Time   CHOL 132 03/02/2020 0448   TRIG 101 03/02/2020 0448   HDL 38 (L) 03/02/2020 0448   CHOLHDL 3.5 03/02/2020 0448   VLDL 20 03/02/2020 0448   LDLCALC 74 03/02/2020 0448    HgbA1C: No results found for: HGBA1C  Urine Drug Screen:  No results found for: LABOPIA, COCAINSCRNUR, LABBENZ, AMPHETMU, THCU, LABBARB  Alcohol Level: No results for input(s): ETH in the last 168 hours.  Other results: EKG: normal EKG, normal sinus rhythm, unchanged from previous tracings.  Imaging: CT HEAD WO CONTRAST  Result Date: 03/01/2020 CLINICAL DATA:  Left-sided numbness. EXAM: CT HEAD WITHOUT CONTRAST TECHNIQUE: Contiguous axial images  were obtained from the base of the skull through the vertex without intravenous contrast. COMPARISON:  December 02, 2017. FINDINGS: Brain: Mild chronic ischemic white matter disease. No mass effect or midline shift is noted. Ventricular size is within normal limits. There is no evidence of mass lesion, hemorrhage or acute infarction. Vascular: No hyperdense vessel or unexpected calcification. Skull: Normal. Negative for fracture or focal lesion. Sinuses/Orbits: No acute finding. Other: None. IMPRESSION: Mild chronic  ischemic white matter disease. No acute intracranial abnormality seen. Electronically Signed   By: Marijo Conception M.D.   On: 03/01/2020 14:40   MR BRAIN WO CONTRAST  Result Date: 03/01/2020 CLINICAL DATA:  Neuro deficit, acute, stroke suspected. Additional history provided: 59 year old female with history of hypertension and strokes who presents with left-sided tingling and numbness which began yesterday morning. EXAM: MRI HEAD WITHOUT CONTRAST TECHNIQUE: Multiplanar, multiecho pulse sequences of the brain and surrounding structures were obtained without intravenous contrast. COMPARISON:  Noncontrast head CT performed earlier the same day 03/01/2020, brain MRI 10/16/2019 FINDINGS: Brain: There is an 8 mm focus of restricted diffusion within the right thalamus consistent with acute infarction (series 5, image 24). Corresponding T2/FLAIR hyperintensity at this site. No acute infarct is identified elsewhere within the brain. Unchanged background mild chronic small vessel ischemic disease. No evidence of intracranial mass. No midline shift or extra-axial fluid collection. No chronic intracranial blood products. Cerebral volume is normal for age. Vascular: Flow voids maintained within the proximal large arterial vessels. Skull and upper cervical spine: No focal marrow lesion Sinuses/Orbits: Visualized orbits demonstrate no acute abnormality. Trace ethmoid sinus mucosal thickening. No significant mastoid effusion IMPRESSION: 8 mm acute right thalamic infarct. This is superimposed upon a chronic infarct at this site. Stable mild background chronic small vessel ischemic disease. Electronically Signed   By: Kellie Simmering DO   On: 03/01/2020 18:39   US Carotid Bilateral (at [facility] and [facility] only)  Result Date: 03/02/2020 CLINICAL DATA:  Stroke like symptoms.  History of hypertension. EXAM: BILATERAL CAROTID DUPLEX ULTRASOUND TECHNIQUE: Pearline Cables scale imaging, color Doppler and duplex ultrasound were performed of bilateral  carotid and vertebral arteries in the neck. COMPARISON:  None. FINDINGS: Examination is degraded due to patient body habitus and poor sonographic window. Criteria: Quantification of carotid stenosis is based on velocity parameters that correlate the residual internal carotid diameter with NASCET-based stenosis levels, using the diameter of the distal internal carotid lumen as the denominator for stenosis measurement. The following velocity measurements were obtained: RIGHT ICA: 77/36 cm/sec CCA: 69/67 cm/sec SYSTOLIC ICA/CCA RATIO:  1.0 ECA: 65 cm/sec LEFT ICA: 60/25 cm/sec CCA: 89/38 cm/sec SYSTOLIC ICA/CCA RATIO:  0.8 ECA: 75 cm/sec RIGHT CAROTID ARTERY: There is no grayscale evidence of significant intimal thickening or atherosclerotic plaque affecting the interrogated portions of the right carotid system. There are no elevated peak systolic velocities within the interrogated course of the right internal carotid artery to suggest a hemodynamically significant stenosis. RIGHT VERTEBRAL ARTERY:  Antegrade flow LEFT CAROTID ARTERY: There is a minimal amount of intimal thickening involving the mid (image 40) and distal (image 44) aspects of the left common carotid artery. There are no elevated peak systolic velocities within the interrogated course of the left internal carotid artery to suggest a hemodynamically significant stenosis. LEFT VERTEBRAL ARTERY:  Antegrade flow IMPRESSION: 1. Very minimal amount of left-sided intimal thickening, not resulting in hemodynamically significant stenosis. 2. Unremarkable sonographic evaluation of the right carotid system. 3. Given poor sonographic window, if there remains clinical concern  for hemodynamically significant stenosis, further evaluation with CTA could be performed as clinically indicated. Electronically Signed   By: Simonne Come M.D.   On: 03/02/2020 08:55     Assessment/Plan:  59 y.o. female with past medical history significant for CVA, depression, anxiety,  arthritis, hypertension, peptic ulcer, cervical radiculopathy, and GERD with c/o Left sided paresthesias, mild left sided weakness, and mild dysarthria/word finding difficulties. She is s/p MRI with R thalamic stroke.  Pt was on ASA 81mg  at home and started on dual anti platelet therapy in the hospital. Patient was on ASA 81mg  TID at home.   - I don't think there is a need for dual anti platelet therapy as she was not taking ASA 81mg  daily. Stroke is in setting of small vessel disease - pt/ot - ASA 325 daily/statin - d/c planning possibly today.

## 2020-03-02 NOTE — Evaluation (Addendum)
Speech Language Pathology Evaluation Patient Details Name: Elizabeth Coffey MRN: [id number] DOB: February 03, 1961 Today's Date: 03/02/2020 Time: 1856-3149 SLP Time Calculation (min) (ACUTE ONLY): 35 min  Problem List:  Patient Active Problem List   Diagnosis Date Noted  . Lethargy 03/01/2020  . Stroke (HCC) 03/01/2020  . Numbness and tingling of left arm and leg 08/24/2018  . Sleep disturbance 08/24/2018  . Chest pain at rest 07/11/2018  . Headache disorder 06/10/2018  . Poorly-controlled hypertension 03/25/2018  . Cervical radiculopathy 01/31/2018  . Hemorrhoids 12/18/2017  . Mild carpal tunnel syndrome of right wrist 12/18/2017  . Depression with anxiety 09/27/2017  . Cervical muscle strain 09/23/2016  . Degenerative disc disease, lumbar 09/23/2016  . Essential hypertension 09/23/2016  . Gastroesophageal reflux disease without esophagitis 09/23/2016  . Abnormal mammogram of left breast 01/06/2016   Past Medical History:  Past Medical History:  Diagnosis Date  . Anxiety   . Arthritis   . Hypertension   . Peptic ulcer    Past Surgical History:  Past Surgical History:  Procedure Laterality Date  . ABDOMINAL HYSTERECTOMY    . BREAST CYST EXCISION     HPI:  Pt is a 59 yr old woman who carries a past medical history significant for CVA, depression, anxiety, arthritis, hypertension, peptic ulcer, cervical radiculopathy, and GERD. She lives alone in a boarding house. She presented to the hospital ED on 03/01/2020 with one day history of numbness of the left side of her body, mild dysarthria/word finding difficulties; also associated with a feeling of off balance when she walks. Patient had uncontrolled hypertension upon arrival. Admitted for potential stroke.  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     Assessment / Plan / Recommendation Clinical Impression  Pt appears to present w/ slight-mild, inconsistent Motor Speech deficits c/b slight Dysfluency and slight Dysarthria. This  intermittently impacted her verbal communication at the sentence-conversation levels. When pt utilized strategies of slowing rate of speech, pausing to take her time during conversation, pt's Dysfluency and dysarthria lessened significantly. Pt is also missing Dentition. Intelligibility of speech was 100% during assessment; conversation w/ MD as well. Practiced w/ pt on slowing down during conversation, especially new questions/information w/ new speakers.  NO expressive or receptive aphasia noted. No cognitive deficits noted. Pt shared issues occurring where she lived which gave this SLP insight on pt's reasoning and problem-solving. Pt stated she spoke out to her Landlord on several occasions about issues at the Boarding Home(she recounted heating issues, drug issues, need for first floor room w/ no stairs as recommended by her MD. She also stated concern about MD recently switching her aspirin to "3x week and here I am told I need it everyday"). Pt also stated she may live w/ Son post d/c as a "better place to live right now". OM exam was grossly wfl for lingual/labial strength/ROM/tone. Discussed Fluency Strategies w/ pt to include slowing down and taking her time during speaking. Practice w/ pt. Recommend pt f/u w/ Outpatient or Home Health skilled ST servcies at discharge if further needs arise re: communication in ADLs. NSG/MD updated. Pt agreed.    SLP Assessment  SLP Recommendation/Assessment: All further Speech Lanaguage Pathology  needs can be addressed in the next venue of care(if needed) SLP Visit Diagnosis: Apraxia (R48.2)(dysfluency)    Follow Up Recommendations  Outpatient SLP;Home health SLP(TBD)    Frequency and Duration (n/a)  (n/a)      SLP Evaluation Cognition  Overall Cognitive Status: Within Functional Limits for tasks  assessed Arousal/Alertness: Awake/alert Orientation Level: Oriented X4 Attention: Focused;Sustained Focused Attention: Appears intact Sustained Attention:  Appears intact Memory: Appears intact(she recalled meal item choices ~7-8 mins later) Awareness: Appears intact Problem Solving: Appears intact(need to live on the first floor; spoke to landlord ) Executive Function: Reasoning;Self Monitoring;Initiating;Decision Making Reasoning: Appears intact Decision Making: Appears intact Initiating: Appears intact Self Monitoring: Appears intact Behaviors: (n/a) Safety/Judgment: Appears intact Comments: stated she spoke out to landlord on several occasions about issues at the Boarding Home(she recounted heating issues, drug issues, needs first floor room w/ no stairs). Also stated she may live w/ Son post d/c as a "better place to live right now".        Comprehension  Auditory Comprehension Overall Auditory Comprehension: Appears within functional limits for tasks assessed Yes/No Questions: Within Functional Limits Commands: Within Functional Limits Conversation: Simple(-General conversation) Other Conversation Comments: benefits from reduced distractions Visual Recognition/Discrimination Discrimination: Not tested Reading Comprehension Reading Status: Not tested    Expression Expression Primary Mode of Expression: Verbal Verbal Expression Overall Verbal Expression: Appears within functional limits for tasks assessed Initiation: No impairment Automatic Speech: Name;Social Response;Counting;Day of week Level of Generative/Spontaneous Verbalization: Conversation Repetition: No impairment Naming: No impairment Pragmatics: No impairment Non-Verbal Means of Communication: Not applicable Written Expression Dominant Hand: Right Written Expression: Not tested   Oral / Motor  Oral Motor/Sensory Function Overall Oral Motor/Sensory Function: Within functional limits Motor Speech Overall Motor Speech: Impaired(slight-min ) Respiration: Within functional limits Phonation: Normal Resonance: Within functional limits(grossly) Articulation:  Impaired(slight) Level of Impairment: Conversation Intelligibility: Intelligible Motor Planning: Impaired(slight) Level of Impairment: Insurance risk surveyor Errors: Aware Interfering Components: (n/a) Effective Techniques: Slow rate;Pause   GO                      Orinda Kenner, MS, CCC-SLP Selassie Spatafore 03/02/2020, 2:43 PM

## 2020-03-02 NOTE — Progress Notes (Signed)
Physical Therapy Evaluation Patient Details Name: Elizabeth Coffey MRN: [id number] DOB: 07-Feb-1961 Today's Date: 03/02/2020   History of Present Illness  The patient is a 59 yr old woman who carries a past medical history significant for CVA, depression, anxiety, arthritis, hypertension, peptic ulcer, cervical radiculopathy, and GERD. She lives alone in a boarding house. She presented to the hospital ED on 03/01/2020 with one day history of numbness of the left side of her body. Mild dysarthria/word finding difficulties present. Patient had uncontrolled hypertension upon arrival. Admitted for potential stroke.  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 Permissive hypertension fro 24-48 hours post stroke.    Clinical Impression  Patient alert and oriented and slightly impulsive. Provided detailed history. Lives alone in a room at a boarding house. Reports she has 2 steps to enter, but her doctor has written a letter that she needs a flat entry. Home is single story and she has a walk in shower and tub/shower combo. Does not have any assistive device and was fully independent prior to hospitalization. Upon evaluation, patient was independent with bed mobility and required SBA - CGA for safety for transfers. Demo slight impulsivity, getting up quickly prior to cuing. Blood pressure elevated at 172/91 mmHg when reclining in bed, re-measured in sitting with slight elevation. Patient hypersensitive to pressure at L UE and LE during strength assessment. Patient with no increased symptoms in standing and able to complete tandem stance > 30 seconds with each foot forward. Ambulated 110 feet and suddenly became dizzy. Chair was brought and BP found to be 186/123 mmHg. Patient felt better and was wheeled back to room where she was assisted back to bed. BP back down to pre-mobility ranges before leaving room and patient with unchanged headache. Updated nursing. She is expected to progress to being functionally independent once  medical conditions improve (BP). Patient appears to have experienced a decrease in activity tolerance and functional mobility. Patient would benefit from skilled physical therapy to address remaining impairments and functional limitations (see PT Problem List below) to work towards stated goals and return to PLOF or maximal functional independence.       Follow Up Recommendations Home health PT    Equipment Recommendations  3in1 (PT)    Recommendations for Other Services OT consult     Precautions / Restrictions Precautions Precautions: Fall Restrictions Weight Bearing Restrictions: No      Mobility  Bed Mobility Overal bed mobility: Independent             General bed mobility comments: supine <> sit independent with bed flat  Transfers Overall transfer level: Needs assistance Equipment used: None Transfers: Sit to/from Stand Sit to Stand: Min guard;Supervision         General transfer comment: Patient transfered sit <> stand bed to chair, chair <> chair,  and back with CGA - SBA. Mildly impulsive, getting up before cued.  Ambulation/Gait Ambulation/Gait assistance: Min guard Gait Distance (Feet): 110 Feet Assistive device: None Gait Pattern/deviations: Step-through pattern Gait velocity: slow   General Gait Details: Patient ambulated with CGA and no AD to the other end of the pod before becoming suddenly dizzy and needed to sit down (chair was brought). BP checked and found to be 186/123 mmHg. HR 69 bpm. Gradually felt better once sitting, transfered to rolling chair and was wheeled back to room where she transfered back to bed. Ambulated with functional but slow gait most of the way but did have one staggering stumble and was able  to self-correct without physical assistance.  Stairs            Wheelchair Mobility    Modified Rankin (Stroke Patients Only) Modified Rankin (Stroke Patients Only) Pre-Morbid Rankin Score: No symptoms Modified Rankin:  Moderately severe disability     Balance Overall balance assessment: Needs assistance Sitting-balance support: No upper extremity supported;Feet supported Sitting balance-Leahy Scale: Normal Sitting balance - Comments: steady sitting at edge of bed   Standing balance support: No upper extremity supported;During functional activity Standing balance-Leahy Scale: Good Standing balance comment: Patient is able to stand and ambulate without AD but did require CGA for safety and stumbled/staggered during gait.     Tandem Stance - Right Leg: 30 Tandem Stance - Left Leg: 30                     Pertinent Vitals/Pain Pain Assessment: 0-10 Pain Score: 4  Pain Location: headache over forehead and B temples Pain Descriptors / Indicators: Aching;Dull;Sharp Pain Intervention(s): Limited activity within patient's tolerance;Monitored during session    Home Living Family/patient expects to be discharged to:: Private residence Living Arrangements: Alone   Type of Home: House(room in boarding house; states her MD wants her to live in flat entry home) Home Access: Stairs to enter Entrance Stairs-Rails: None Entrance Stairs-Number of Steps: 2 Home Layout: One level Home Equipment: None Additional Comments: Patient reports she lives in a room at a boarding house. Her MD recently gave instructions that she should live in a home with no steps to enter.    Prior Function Level of Independence: Independent         Comments: Patient reports she was I with all ADLs and IALDs, but she was having trouble climbing stairs. Reports 3 falls (fell into the walls in the hallway) over the last 6 months.     Hand Dominance   Dominant Hand: Right    Extremity/Trunk Assessment   Upper Extremity Assessment Upper Extremity Assessment: Defer to OT evaluation;Overall WFL for tasks assessed;RUE deficits/detail;LUE deficits/detail RUE Deficits / Details: AROM WFL, strength functional but  hypersensitive to pressure when completing MMT. RUE: Unable to fully assess due to pain RUE Sensation: decreased light touch LUE Deficits / Details: WNL    Lower Extremity Assessment Lower Extremity Assessment: LLE deficits/detail(R LE WFL) LLE Deficits / Details: Pain at knee with quad MMT. hip flexion 4+/5, knee extension 4/5 painful, ankle DF, eversion, PF appears WFL. LLE: Unable to fully assess due to pain LLE Sensation: decreased light touch    Cervical / Trunk Assessment Cervical / Trunk Assessment: Normal  Communication   Communication: No difficulties  Cognition Arousal/Alertness: Awake/alert Behavior During Therapy: WFL for tasks assessed/performed Overall Cognitive Status: Within Functional Limits for tasks assessed                                 General Comments: A & O x 4. Mildly impulsive      General Comments      Exercises Other Exercises Other Exercises: educated on role of physical therapy in the acute care setting. Took repeated blood pressure readings to ensure safety during session and educated pt on results.   Assessment/Plan    PT Assessment Patient needs continued PT services  PT Problem List Pain;Decreased activity tolerance;Decreased balance;Decreased safety awareness;Decreased mobility;Decreased knowledge of precautions;Decreased knowledge of use of DME;Decreased cognition;Impaired sensation;Cardiopulmonary status limiting activity       PT Treatment  Interventions DME instruction;Balance training;Gait training;Neuromuscular re-education;Stair training;Cognitive remediation;Functional mobility training;Patient/family education;Therapeutic activities;Therapeutic exercise    PT Goals (Current goals can be found in the Care Plan section)  Acute Rehab PT Goals Patient Stated Goal: to get better PT Goal Formulation: With patient Time For Goal Achievement: 03/16/20 Potential to Achieve Goals: Good    Frequency 7X/week   Barriers to  discharge Decreased caregiver support;Inaccessible home environment steps and lack of caregiver availablility    Co-evaluation               AM-PAC PT "6 Clicks" Mobility  Outcome Measure Help needed turning from your back to your side while in a flat bed without using bedrails?: None Help needed moving from lying on your back to sitting on the side of a flat bed without using bedrails?: None Help needed moving to and from a bed to a chair (including a wheelchair)?: A Little Help needed standing up from a chair using your arms (e.g., wheelchair or bedside chair)?: A Little Help needed to walk in hospital room?: A Little Help needed climbing 3-5 steps with a railing? : A Lot 6 Click Score: 19    End of Session Equipment Utilized During Treatment: Gait belt Activity Tolerance: Treatment limited secondary to medical complications (Comment)(became dizzy and required chair when BP increased during ambulation) Patient left: in bed;with call bell/phone within reach;with bed alarm set Nurse Communication: Mobility status PT Visit Diagnosis: Unsteadiness on feet (R26.81);Hemiplegia and hemiparesis;Difficulty in walking, not elsewhere classified (R26.2);History of falling (Z91.81);Pain Hemiplegia - Right/Left: Left Hemiplegia - dominant/non-dominant: Non-dominant Hemiplegia - caused by: Cerebral infarction Pain - part of body: (headache)    Time: 8119-1478 PT Time Calculation (min) (ACUTE ONLY): 37 min   Charges:   PT Evaluation $PT Eval Moderate Complexity: 1 Mod PT Treatments $Gait Training: 8-22 mins        Luretha Murphy. Ilsa Iha, PT, DPT 03/02/20, 1:18 PM

## 2020-03-02 NOTE — Evaluation (Signed)
Clinical/Bedside Swallow Evaluation Patient Details  Name: Elizabeth Coffey MRN: [id number] Date of Birth: [birth year]  Today's Date: 03/02/2020 Time: SLP Start Time (ACUTE ONLY): 1020 SLP Stop Time (ACUTE ONLY): 1050 SLP Time Calculation (min) (ACUTE ONLY): 30 min  Past Medical History:  Past Medical History:  Diagnosis Date  . Anxiety   . Arthritis   . Hypertension   . Peptic ulcer    Past Surgical History:  Past Surgical History:  Procedure Laterality Date  . ABDOMINAL HYSTERECTOMY    . BREAST CYST EXCISION     HPI:  Pt is a 59 yr old woman who carries a past medical history significant for CVA, depression, anxiety, arthritis, hypertension, peptic ulcer, cervical radiculopathy, and GERD. She lives alone in a boarding house. She presented to the hospital ED on 03/01/2020 with one day history of numbness of the left side of her body, mild dysarthria/word finding difficulties; also associated with a feeling of off balance when she walks. Patient had uncontrolled hypertension upon arrival. Admitted for potential stroke.  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     Assessment / Plan / Recommendation Clinical Impression  Pt appears to present w/ adequate oropharyngeal phase swallow function w/ trials given at this evaluation today; pt appears at reduced risk for aspiration when following general aspiration precautions. Pt is missing few Dentition and is careful when chewing tougher foods she reports. Pt was educated on using general aspiration precautions w/ trials presented. Pt consumed ice chips, thin liquids via cup/straw, and purees/soft solids w/ No immediate, overt s/s of aspiration noted - no immediate coughing, no wet vocal quality b/t trials, no decline in respiratory effort during/post trials. Oral phase appeared [facility]; timely bolus management, A-P transfer, and oral clearing. Pt fed self w/ no setup support given. OM exam appeared grossly WFL; no unilateral lingual/labial weakness noted. Pt  c/o slight Left facial tingling still as in the whole Left side of body. Pt was insightful and sat herself fully upright to eat/drink; took her time w/ po intake. Recommend a Regular diet w/ thin liquids; general aspiration precautions. No further ST services indicated at this time; NSG/MD updated. Pt agreed.  SLP Visit Diagnosis: Dysphagia, unspecified (R13.10)    Aspiration Risk  (reduced following general precs)    Diet Recommendation  Regular diet w/ thin liquids; general aspiration precautions at meals  Medication Administration: Whole meds with liquid(or whole in a puree if easier for swallowing)    Other  Recommendations Recommended Consults: (n/a) Oral Care Recommendations: Oral care BID;Patient independent with oral care Other Recommendations: (n/a)   Follow up Recommendations None      Frequency and Duration (n/a)  (n/a)       Prognosis Prognosis for Safe Diet Advancement: Good Barriers to Reach Goals: (n/a)      Swallow Study   General Date of Onset: 03/01/20 HPI: Pt is a 59 yr old woman who carries a past medical history significant for CVA, depression, anxiety, arthritis, hypertension, peptic ulcer, cervical radiculopathy, and GERD. She lives alone in a boarding house. She presented to the hospital ED on 03/01/2020 with one day history of numbness of the left side of her body, mild dysarthria/word finding difficulties; also associated with a feeling of off balance when she walks. Patient had uncontrolled hypertension upon arrival. Admitted for potential stroke.  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   Type of Study: Bedside Swallow Evaluation Previous Swallow Assessment: none Diet Prior to this Study: NPO(regular diet at home)  Temperature Spikes Noted: No(wbc 6.0) Respiratory Status: Room air History of Recent Intubation: No Behavior/Cognition: Alert;Cooperative;Pleasant mood Oral Cavity Assessment: Within Functional Limits Oral Care Completed by SLP: Yes Oral  Cavity - Dentition: Missing dentition;Adequate natural dentition Vision: Functional for self-feeding Self-Feeding Abilities: Able to feed self Patient Positioning: Upright in bed(sat herself upright for po trials) Baseline Vocal Quality: Normal Volitional Cough: Strong Volitional Swallow: Able to elicit    Oral/Motor/Sensory Function Overall Oral Motor/Sensory Function: Within functional limits   Ice Chips Ice chips: Within functional limits Presentation: Spoon(fed; 2 trials)   Thin Liquid Thin Liquid: Within functional limits Presentation: Cup;Self Fed;Straw(~6+ ozs)    Nectar Thick Nectar Thick Liquid: Not tested   Honey Thick Honey Thick Liquid: Not tested   Puree Puree: Within functional limits Presentation: Self Fed;Spoon(4 ozs)   Solid     Solid: Within functional limits Presentation: Self Fed;Spoon(10 trials)        Orinda Kenner, MS, CCC-SLP Antwaine Boomhower 03/02/2020,2:21 PM

## 2020-03-02 NOTE — Evaluation (Signed)
Occupational Therapy Evaluation Patient Details Name: Elizabeth Coffey MRN: [id number] DOB: Jun 27, 1961 Today's Date: 03/02/2020    History of Present Illness The patient is a 59 yr old woman who carries a past medical history significant for CVA, depression, anxiety, arthritis, hypertension, peptic ulcer, cervical radiculopathy, and GERD. She lives alone in a boarding house. She presented to the hospital ED on 03/01/2020 with one day history of numbness of the left side of her body. Mild dysarthria/word finding difficulties present. Patient had uncontrolled hypertension upon arrival. Admitted for potential stroke.  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 Permissive hypertension fro 24-48 hours post stroke.   Clinical Impression   Elizabeth Coffey was seen for OT evaluation this date. Prior to hospital admission, pt was independent in all aspects of ADL. Pt lives in a one-level boarding home with two steps to enter. Currently pt demonstrates significant impairments in LUE/LLE coordination and sensation. Pt is R hand dominant and requires MOD I - SUP for seated ADL. Pt was Independent with bed mobility however following ~15 sitting EOB pt c/o nausea, feeling flushed and blurry vision. Pt returned to bed and BP taken on L arm  (BP 260/133, MAP 168). OT notified RN, reclined pt c cold wash cloth over head. BP after resting ~56min in bed (210/110, MAP 146). RN paged MD. Elizabeth Coffey to further assess OOB mobility or ADLs but anticipate will progress as medical conditions resolve (BP). Pt is eager to return to her PLOF with increased independence and functional use of her left arm and leg. Pt educated on LUE HEP in order to promote functional return, improve safety, and decrease sensitivity to pain. Pt was motivated t/o OT session and return demonstrated LUE exercises with VCs for technique. No cognitive deficits noted with assessment on this date. Pt would benefit from skilled OT to address noted impairments and functional  limitations (see below for any additional details) in order to maximize safety and independence while minimizing falls risk and caregiver burden.  Upon hospital discharge, recommend HHOT to maximize safety and return to PLOF.    Follow Up Recommendations  Home health OT    Equipment Recommendations  3 in 1 bedside commode    Recommendations for Other Services       Precautions / Restrictions Precautions Precautions: Fall Restrictions Weight Bearing Restrictions: No      Mobility Bed Mobility Overal bed mobility: Independent             General bed mobility comments: supine <> sit independent with bed flat  Transfers Overall transfer level: Needs assistance Equipment used: None Transfers: Sit to/from Stand Sit to Stand: Min guard;Supervision         General transfer comment: OOB mobility NT 2/2 symptomatic hypertension - BP 260/133 MAP 168 seated in bed    Balance Overall balance assessment: Needs assistance Sitting-balance support: No upper extremity supported;Feet supported Sitting balance-Elizabeth Coffey Scale: Normal Sitting balance - Comments: static sitting at edge of bed   Standing balance support: No upper extremity supported;During functional activity Standing balance-Elizabeth Coffey Scale: Good Standing balance comment: Patient is able to stand and ambulate without AD but did require CGA for safety and stumbled/staggered during gait.     Tandem Stance - Right Leg: 30 Tandem Stance - Left Leg: 30                   ADL either performed or assessed with clinical judgement   ADL Overall ADL's : Needs assistance/impaired  General ADL Comments: MOD I don/doff socks long sitting in bed. MOD I deodorant and simulated UBD long sitting in bed. Further ADL assessment deferred 2/2 BP     Vision Baseline Vision/History: Wears glasses Wears Glasses: Reading only Patient Visual Report: Blurring of vision        Perception     Praxis      Pertinent Vitals/Pain Pain Assessment: 0-10 Pain Score: 3  Pain Location: L side (tongue, arm, leg, back) Pain Descriptors / Indicators: Aching Pain Intervention(s): Limited activity within patient's tolerance;Repositioned     Hand Dominance Right   Extremity/Trunk Assessment Upper Extremity Assessment Upper Extremity Assessment: LUE deficits/detail RUE Deficits / Details: AROM WFL, strength functional but hypersensitive to pressure when completing MMT. RUE: Unable to fully assess due to pain RUE Sensation: decreased light touch LUE Deficits / Details: LUE AROM WFL. Intact 5 finger opposition c increased time. L grip 3+/5. RAM slowed but intact LUE Sensation: decreased light touch(Pt reports tingling and numbess t/o LUE) LUE Coordination: decreased fine motor   Lower Extremity Assessment Lower Extremity Assessment: LLE deficits/detail LLE Deficits / Details: Pain at knee with quad MMT. hip flexion 4+/5, knee extension 4/5 painful, ankle DF, eversion, PF appears WFL. LLE: Unable to fully assess due to pain LLE Sensation: history of peripheral neuropathy   Cervical / Trunk Assessment Cervical / Trunk Assessment: Normal   Communication Communication Communication: No difficulties   Cognition Arousal/Alertness: Awake/alert Behavior During Therapy: WFL for tasks assessed/performed Overall Cognitive Status: Within Functional Limits for tasks assessed                                 General Comments: A & O x 4. Mildly impulsive   General Comments  Following RN IV procedure at bedside and ~15 in sitting EOB pt c/o nausea, feeling flushed and blurry vision. Pt returned to reclined in bed and BP taken on L arm - BP 260/133, MAP 168. OT notified RN, reclined pt to supine c cold wash cloth over head. BP after resting ~43min in bed 210/110, MAP 146. RN to page MD.    Exercises Exercises: Other exercises Other Exercises Other Exercises: Pt  educated re: falls prevention, stroke education, desensitizing techniques, OT role, DME recommendations, home/routine modifications/strategies Other Exercises: Don/doff B socks, sitting balance/tolerance, facewashing, grooming, self-drinking, LUE HEP (fist flexion/extension, 5 finger opposition, desensitization).   Shoulder Instructions      Home Living Family/patient expects to be discharged to:: Private residence Living Arrangements: Alone Available Help at Discharge: Family;Available PRN/intermittently Type of Home: House(room in boarding house; states her MD wants her to live in home c flat entry 2/2 peripheral neuropathy. Pt states her son plans to have her stay with him at d/c if able.) Home Access: Stairs to enter [employer] of Steps: 2 Entrance Stairs-Rails: None Home Layout: One level     Bathroom Shower/Tub: Walk-in shower(bathroom shared - pt states is unclean and no hot water)   Bathroom Toilet: Standard Bathroom Accessibility: Yes How Accessible: Accessible via walker Home Equipment: None   Additional Comments: Patient reports she lives in a room at a boarding house. Her MD recently gave instructions that she should live in a home with no steps to enter. Pt and sister agree house is unclean, floor is uneven and has caused multiple falls, and there is minimal heating. Pt states her son wants her to move in with him rather than return to boarding  house.   Lives With: Alone    Prior Functioning/Environment Level of Independence: Independent        Comments: Patient reports she was I with all ADLs and IALDs, but she was having trouble climbing stairs. Reports 3 falls (fell into the walls in the hallway) over the last 6 months.        OT Problem List: Decreased strength;Decreased range of motion;Decreased activity tolerance;Impaired vision/perception;Decreased coordination;Decreased knowledge of use of DME or AE;Decreased knowledge of precautions;Impaired  sensation;Impaired UE functional use;Pain      OT Treatment/Interventions: Self-care/ADL training;Therapeutic exercise;Neuromuscular education;Energy conservation;DME and/or AE instruction;Therapeutic activities;Cognitive remediation/compensation;Visual/perceptual remediation/compensation;Patient/family education;Balance training    OT Goals(Current goals can be found in the care plan section) Acute Rehab OT Goals Patient Stated Goal: to get better OT Goal Formulation: With patient Time For Goal Achievement: 03/16/20 Potential to Achieve Goals: Good ADL Goals Pt Will Perform Grooming: with supervision;standing(c LRAD PRN and no cues for seated rest breaks) Pt Will Transfer to Toilet: with supervision;ambulating(c LRAD PRN) Pt Will Perform Toileting - Clothing Manipulation and hygiene: with supervision;sit to/from stand(c LRAD PRN) Pt/caregiver will Perform Home Exercise Program: Increased strength;Left upper extremity;Independently(for improved cine motor control and coordination)  OT Frequency: Min 2X/week   Barriers to D/C: Inaccessible home environment;Decreased caregiver support          Co-evaluation              AM-PAC OT "6 Clicks" Daily Activity     Outcome Measure Help from another person eating meals?: A Little Help from another person taking care of personal grooming?: None Help from another person toileting, which includes using toliet, bedpan, or urinal?: A Little Help from another person bathing (including washing, rinsing, drying)?: A Little Help from another person to put on and taking off regular upper body clothing?: None   6 Click Score: 17   End of Session    Activity Tolerance: Treatment limited secondary to medical complications (Comment)(BP Hypertension) Patient left: in bed;with call bell/phone within reach;with bed alarm set;with family/visitor present(sister)  OT Visit Diagnosis: History of falling (Z91.81);Hemiplegia and hemiparesis;Other  abnormalities of gait and mobility (R26.89) Hemiplegia - Right/Left: Left Hemiplegia - dominant/non-dominant: Non-Dominant 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 OTR/L  03/02/20, 4:40 PM

## 2020-03-03 LAB — CBC
HCT: 40.1 % (ref 36.0–46.0)
Hemoglobin: 12.9 g/dL (ref 12.0–15.0)
MCH: 26.1 pg (ref 26.0–34.0)
MCHC: 32.2 g/dL (ref 30.0–36.0)
MCV: 81 fL (ref 80.0–100.0)
Platelets: 264 10*3/uL (ref 150–400)
RBC: 4.95 MIL/uL (ref 3.87–5.11)
RDW: 13.3 % (ref 11.5–15.5)
WBC: 6.9 10*3/uL (ref 4.0–10.5)
nRBC: 0 % (ref 0.0–0.2)

## 2020-03-03 LAB — BASIC METABOLIC PANEL
Anion gap: 11 (ref 5–15)
BUN: 17 mg/dL (ref 6–20)
CO2: 25 mmol/L (ref 22–32)
Calcium: 9.5 mg/dL (ref 8.9–10.3)
Chloride: 104 mmol/L (ref 98–111)
Creatinine, Ser: 0.92 mg/dL (ref 0.44–1.00)
GFR calc Af Amer: >60 mL/min (ref >60)
GFR calc non Af Amer: >60 mL/min (ref >60)
Glucose, Bld: 128 mg/dL — ABNORMAL HIGH (ref 70–99)
Potassium: 3.7 mmol/L (ref 3.5–5.1)
Sodium: 140 mmol/L (ref 135–145)

## 2020-03-03 MED ORDER — AMLODIPINE BESYLATE 10 MG PO TABS
10.0000 mg | ORAL_TABLET | Freq: Every day | ORAL | Status: DC
  Administered 2020-03-03 – 2020-03-04 (×2): 10 mg via ORAL
  Filled 2020-03-03 (×2): qty 1

## 2020-03-03 MED ORDER — LISINOPRIL 20 MG PO TABS
20.0000 mg | ORAL_TABLET | Freq: Every day | ORAL | Status: DC
  Administered 2020-03-03 – 2020-03-04 (×2): 20 mg via ORAL
  Filled 2020-03-03 (×2): qty 1

## 2020-03-03 NOTE — Consult Note (Signed)
Subjective:  Improved sensation on L side and facial droop   Past Medical History:  Diagnosis Date  . Anxiety   . Arthritis   . Hypertension   . Peptic ulcer     Past Surgical History:  Procedure Laterality Date  . ABDOMINAL HYSTERECTOMY    . BREAST CYST EXCISION      Family History  Problem Relation Age of Onset  . Mental illness Maternal Uncle     Social History:  reports that she has never smoked. She has never used smokeless tobacco. She reports that she does not drink alcohol or use drugs.  Allergies  Allergen Reactions  . Hydrocodone-Acetaminophen Anaphylaxis  . Aspirin Itching  . Mobic [Meloxicam] Other (See Comments)    Light headed and vomiting.   . Tramadol Other (See Comments)    Stomach pain   . Chocolate Itching and Rash  . Cocoa Itching and Rash    Medications: I have reviewed the patient's current medications.  ROS: History obtained from the patient  General ROS: negative for - chills, fatigue, fever, night sweats, weight gain or weight loss Psychological ROS: negative for - behavioral disorder, hallucinations, memory difficulties, mood swings or suicidal ideation Ophthalmic ROS: negative for - blurry vision, double vision, eye pain or loss of vision ENT ROS: negative for - epistaxis, nasal discharge, oral lesions, sore throat, tinnitus or vertigo Allergy and Immunology ROS: negative for - hives or itchy/watery eyes Hematological and Lymphatic ROS: negative for - bleeding problems, bruising or swollen lymph nodes Endocrine ROS: negative for - galactorrhea, hair pattern changes, polydipsia/polyuria or temperature intolerance Respiratory ROS: negative for - cough, hemoptysis, shortness of breath or wheezing Cardiovascular ROS: negative for - chest pain, dyspnea on exertion, edema or irregular heartbeat Gastrointestinal ROS: negative for - abdominal pain, diarrhea, hematemesis, nausea/vomiting or stool incontinence Genito-Urinary ROS: negative for -  dysuria, hematuria, incontinence or urinary frequency/urgency Musculoskeletal ROS: negative for - joint swelling or muscular weakness Neurological ROS: as noted in HPI Dermatological ROS: negative for rash and skin lesion changes  Physical Examination: Blood pressure (!) 205/140, pulse 97, temperature 98.2 F (36.8 C), resp. rate 16, height 5\' 4"  (1.626 m), weight 93.4 kg, SpO2 99 %.   Neurological Examination   Mental Status: Alert, oriented, thought content appropriate.  Speech fluent without evidence of aphasia.  Able to follow 3 step commands without difficulty. Cranial Nerves: II: Discs flat bilaterally; Visual fields grossly normal, pupils equal, round, reactive to light and accommodation III,IV, VI: ptosis not present, extra-ocular motions intact bilaterally V,VII: smile symmetric, facial light touch sensation normal bilaterally VIII: hearing normal bilaterally IX,X: gag reflex present XI: bilateral shoulder shrug XII: midline tongue extension Motor: Right : Upper extremity   5/5    Left:     Upper extremity   5/5  Lower extremity   5/5     Lower extremity   5/5 Tone and bulk:normal tone throughout; no atrophy noted Sensory: L sided numbness Deep Tendon Reflexes: 1+ and symmetric throughout Plantars: Right: downgoing   Left: downgoing Cerebellar: normal finger-to-nose, normal rapid alternating movements and normal heel-to-shin test Gait: not tested      Laboratory Studies:   Basic Metabolic Panel: Recent Labs  Lab 03/01/20 1400 03/02/20 0448 03/03/20 0446  NA 143 145 140  K 3.3* 3.0* 3.7  CL 112* 114* 104  CO2 24 24 25   GLUCOSE 141* 97 128*  BUN 16 14 17   CREATININE 0.99 0.81 0.92  CALCIUM 9.3 8.9 9.5  Liver Function Tests: Recent Labs  Lab 03/01/20 1400  AST 22  ALT 18  ALKPHOS 86  BILITOT 0.4  PROT 7.5  ALBUMIN 4.0   No results for input(s): LIPASE, AMYLASE in the last 168 hours. No results for input(s): AMMONIA in the last 168  hours.  CBC: Recent Labs  Lab 03/01/20 1400 03/02/20 0448 03/03/20 0446  WBC 5.6 6.0 6.9  NEUTROABS 3.5  --   --   HGB 12.8 12.0 12.9  HCT 39.7 37.1 40.1  MCV 81.4 81.0 81.0  PLT 268 259 264    Cardiac Enzymes: No results for input(s): CKTOTAL, CKMB, CKMBINDEX, TROPONINI in the last 168 hours.  BNP: Invalid input(s): POCBNP  CBG: Recent Labs  Lab 03/01/20 1720  GLUCAP 96    Microbiology: Results for orders placed or performed during the hospital encounter of 03/01/20  SARS CORONAVIRUS 2 (TAT 6-24 HRS) Nasopharyngeal Nasopharyngeal Swab     Status: None   Collection Time: 03/01/20  3:52 PM   Specimen: Nasopharyngeal Swab  Result Value Ref Range Status   SARS Coronavirus 2 NEGATIVE NEGATIVE Final    Comment: (NOTE) SARS-CoV-2 target nucleic acids are NOT DETECTED. The SARS-CoV-2 RNA is generally detectable in upper and lower respiratory specimens during the acute phase of infection. Negative results do not preclude SARS-CoV-2 infection, do not rule out co-infections with other pathogens, and should not be used as the sole basis for treatment or other patient management decisions. Negative results must be combined with clinical observations, patient history, and epidemiological information. The expected result is Negative. Fact Sheet for Patients: HairSlick.no Fact Sheet for Healthcare Providers: quierodirigir.com This test is not yet approved or cleared by the [place] FDA and  has been authorized for detection and/or diagnosis of SARS-CoV-2 by FDA under an Emergency Use Authorization (EUA). This EUA will remain  in effect (meaning this test can be used) for the duration of the COVID-19 declaration under Section 56 4(b)(1) of the Act, 21 U.S.C. section 360bbb-3(b)(1), unless the authorization is terminated or revoked sooner. Performed at [facility], [address] [place], [place] [address]      Coagulation Studies: Recent Labs    03/01/20 1400  LABPROT 13.1  INR 1.0    Urinalysis: No results for input(s): COLORURINE, LABSPEC, PHURINE, GLUCOSEU, HGBUR, BILIRUBINUR, KETONESUR, PROTEINUR, UROBILINOGEN, NITRITE, LEUKOCYTESUR in the last 168 hours.  Invalid input(s): APPERANCEUR  Lipid Panel:     Component Value Date/Time   CHOL 132 03/02/2020 0448   TRIG 101 03/02/2020 0448   HDL 38 (L) 03/02/2020 0448   CHOLHDL 3.5 03/02/2020 0448   VLDL 20 03/02/2020 0448   LDLCALC 74 03/02/2020 0448    HgbA1C:  Lab Results  Component Value Date   HGBA1C 6.1 (H) 03/02/2020    Urine Drug Screen:  No results found for: LABOPIA, COCAINSCRNUR, LABBENZ, AMPHETMU, THCU, LABBARB  Alcohol Level: No results for input(s): ETH in the last 168 hours.  Other results: EKG: normal EKG, normal sinus rhythm, unchanged from previous tracings.  Imaging: CT ANGIO HEAD W OR WO CONTRAST  Result Date: 03/02/2020 CLINICAL DATA:  Left-sided tingling and numbness. Acute right thalamic infarction. EXAM: CT ANGIOGRAPHY HEAD AND NECK TECHNIQUE: Multidetector CT imaging of the head and neck was performed using the standard protocol during bolus administration of intravenous contrast. Multiplanar CT image reconstructions and MIPs were obtained to evaluate the vascular anatomy. Carotid stenosis measurements (when applicable) are obtained utilizing NASCET criteria, using the distal internal carotid diameter as  the denominator. CONTRAST:  1mL OMNIPAQUE IOHEXOL 350 MG/ML SOLN COMPARISON:  MRI 03/01/2020 FINDINGS: CT HEAD FINDINGS Brain: 1 cm focal low-density noted in the lateral right thalamus consistent with the acute stroke shown by MRI. Elsewhere, the brain appears normal by CT. Small-vessel ischemic changes of the white matter and left thalamus shown by MRI are not appreciable. Vascular: There is atherosclerotic calcification of the major vessels at the base of the brain. Skull: Negative Sinuses: Clear Orbits:  Normal Review of the MIP images confirms the above findings CTA NECK FINDINGS Aortic arch: Minimal aortic atherosclerotic plaque. No aneurysm or dissection. Branching pattern is normal without origin stenosis. Right carotid system: Common carotid artery widely patent to the bifurcation. Minimal soft plaque in the ICA bulb but no stenosis. Cervical ICA widely patent beyond the bulb. Left carotid system: Common carotid artery widely patent to the bifurcation region. Carotid bifurcation is normal. No stenosis or irregularity. Cervical ICA is normal. Vertebral arteries: Both vertebral artery origins are widely patent. Both vertebral arteries appear normal through the cervical region to the foramen magnum. Skeleton: Ordinary cervical spondylosis. Other neck: No mass or lymphadenopathy. Upper chest: Normal Review of the MIP images confirms the above findings CTA HEAD FINDINGS Anterior circulation: Both internal carotid arteries are patent through the skull base. There is ordinary siphon atherosclerotic calcification but without stenosis greater than 30% on either side. The anterior and middle cerebral vessels are patent without proximal stenosis, aneurysm or vascular malformation. No large or medium vessel occlusion. Posterior circulation: Both vertebral arteries widely patent through the foramen magnum to the basilar. Moderate atherosclerotic irregularity of the basilar artery but without flow limiting stenosis. Maximal stenosis in the proximal half is estimated at 30-50%. Superior cerebellar and posterior cerebral arteries show flow. There is atherosclerotic irregularity of the PCA branches, much more extensive on the right than left. Venous sinuses: Patent and normal. Anatomic variants: None significant. Review of the MIP images confirms the above findings IMPRESSION: Minimal aortic atherosclerosis. Mild nonstenotic plaque at the right carotid bifurcation. Left carotid bifurcation appears normal. No vertebral artery  origin stenosis or cervical vertebral artery pathology. 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 Atherosclerotic disease of the posterior cerebral artery branches, more extensive on the right than the left. No correctable proximal stenosis. Electronically Signed   By: Paulina Fusi M.D.   On: 03/02/2020 22:02   CT HEAD WO CONTRAST  Result Date: 03/01/2020 CLINICAL DATA:  Left-sided numbness. EXAM: CT HEAD WITHOUT CONTRAST TECHNIQUE: Contiguous axial images were obtained from the base of the skull through the vertex without intravenous contrast. COMPARISON:  December 02, 2017. FINDINGS: Brain: Mild chronic ischemic white matter disease. No mass effect or midline shift is noted. Ventricular size is within normal limits. There is no evidence of mass lesion, hemorrhage or acute infarction. Vascular: No hyperdense vessel or unexpected calcification. Skull: Normal. Negative for fracture or focal lesion. Sinuses/Orbits: No acute finding. Other: None. IMPRESSION: Mild chronic ischemic white matter disease. No acute intracranial abnormality seen. Electronically Signed   By: Lupita Raider M.D.   On: 03/01/2020 14:40   CT ANGIO NECK W OR WO CONTRAST  Result Date: 03/02/2020 CLINICAL DATA:  Left-sided tingling and numbness. Acute right thalamic infarction. EXAM: CT ANGIOGRAPHY HEAD AND NECK TECHNIQUE: Multidetector CT imaging of the head and neck was performed using the standard protocol during bolus administration of intravenous contrast. Multiplanar CT image reconstructions  and MIPs were obtained to evaluate the vascular anatomy. Carotid stenosis measurements (when applicable) are obtained utilizing NASCET criteria, using the distal internal carotid diameter as the denominator. CONTRAST:  56mL OMNIPAQUE IOHEXOL 350 MG/ML SOLN COMPARISON:  MRI 03/01/2020 FINDINGS: CT HEAD FINDINGS Brain: 1 cm focal  low-density noted in the lateral right thalamus consistent with the acute stroke shown by MRI. Elsewhere, the brain appears normal by CT. Small-vessel ischemic changes of the white matter and left thalamus shown by MRI are not appreciable. Vascular: There is atherosclerotic calcification of the major vessels at the base of the brain. Skull: Negative Sinuses: Clear Orbits: Normal Review of the MIP images confirms the above findings CTA NECK FINDINGS Aortic arch: Minimal aortic atherosclerotic plaque. No aneurysm or dissection. Branching pattern is normal without origin stenosis. Right carotid system: Common carotid artery widely patent to the bifurcation. Minimal soft plaque in the ICA bulb but no stenosis. Cervical ICA widely patent beyond the bulb. Left carotid system: Common carotid artery widely patent to the bifurcation region. Carotid bifurcation is normal. No stenosis or irregularity. Cervical ICA is normal. Vertebral arteries: Both vertebral artery origins are widely patent. Both vertebral arteries appear normal through the cervical region to the foramen magnum. Skeleton: Ordinary cervical spondylosis. Other neck: No mass or lymphadenopathy. Upper chest: Normal Review of the MIP images confirms the above findings CTA HEAD FINDINGS Anterior circulation: Both internal carotid arteries are patent through the skull base. There is ordinary siphon atherosclerotic calcification but without stenosis greater than 30% on either side. The anterior and middle cerebral vessels are patent without proximal stenosis, aneurysm or vascular malformation. No large or medium vessel occlusion. Posterior circulation: Both vertebral arteries widely patent through the foramen magnum to the basilar. Moderate atherosclerotic irregularity of the basilar artery but without flow limiting stenosis. Maximal stenosis in the proximal half is estimated at 30-50%. Superior cerebellar and posterior cerebral arteries show flow. There is  atherosclerotic irregularity of the PCA branches, much more extensive on the right than left. Venous sinuses: Patent and normal. Anatomic variants: None significant. Review of the MIP images confirms the above findings IMPRESSION: Minimal aortic atherosclerosis. Mild nonstenotic plaque at the right carotid bifurcation. Left carotid bifurcation appears normal. No vertebral artery origin stenosis or cervical vertebral artery pathology. 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 Atherosclerotic disease of the posterior cerebral artery branches, more extensive on the right than the left. No correctable proximal stenosis. Electronically Signed   By: Paulina Fusi M.D.   On: 03/02/2020 22:02   MR BRAIN WO CONTRAST  Result Date: 03/01/2020 CLINICAL DATA:  Neuro deficit, acute, stroke suspected. Additional history provided: 59 year old female with history of hypertension and strokes who presents with left-sided tingling and numbness which began yesterday morning. EXAM: MRI HEAD WITHOUT CONTRAST TECHNIQUE: Multiplanar, multiecho pulse sequences of the brain and surrounding structures were obtained without intravenous contrast. COMPARISON:  Noncontrast head CT performed earlier the same day 03/01/2020, brain MRI 10/16/2019 FINDINGS: Brain: There is an 8 mm focus of restricted diffusion within the right thalamus consistent with acute infarction (series 5, image 24). Corresponding T2/FLAIR hyperintensity at this site. No acute infarct is identified elsewhere within the brain. Unchanged background mild chronic small vessel ischemic disease. No evidence of intracranial mass. No midline shift or extra-axial fluid collection. No chronic intracranial blood products. Cerebral volume is normal for age. Vascular: Flow voids maintained within the proximal large arterial  vessels. Skull and upper cervical spine: No  focal marrow lesion Sinuses/Orbits: Visualized orbits demonstrate no acute abnormality. Trace ethmoid sinus mucosal thickening. No significant mastoid effusion IMPRESSION: 8 mm acute right thalamic infarct. This is superimposed upon a chronic infarct at this site. Stable mild background chronic small vessel ischemic disease. Electronically Signed   By: Jackey Loge DO   On: 03/01/2020 18:39   US Carotid Bilateral (at [facility] only)  Result Date: 03/02/2020 CLINICAL DATA:  Stroke like symptoms.  History of hypertension. EXAM: BILATERAL CAROTID DUPLEX ULTRASOUND TECHNIQUE: Wallace Cullens scale imaging, color Doppler and duplex ultrasound were performed of bilateral carotid and vertebral arteries in the neck. COMPARISON:  None. FINDINGS: Examination is degraded due to patient body habitus and poor sonographic window. Criteria: Quantification of carotid stenosis is based on velocity parameters that correlate the residual internal carotid diameter with NASCET-based stenosis levels, using the diameter of the distal internal carotid lumen as the denominator for stenosis measurement. The following velocity measurements were obtained: RIGHT ICA: 77/36 cm/sec CCA: 76/23 cm/sec SYSTOLIC ICA/CCA RATIO:  1.0 ECA: 65 cm/sec LEFT ICA: 60/25 cm/sec CCA: 73/23 cm/sec SYSTOLIC ICA/CCA RATIO:  0.8 ECA: 75 cm/sec RIGHT CAROTID ARTERY: There is no grayscale evidence of significant intimal thickening or atherosclerotic plaque affecting the interrogated portions of the right carotid system. There are no elevated peak systolic velocities within the interrogated course of the right internal carotid artery to suggest a hemodynamically significant stenosis. RIGHT VERTEBRAL ARTERY:  Antegrade flow LEFT CAROTID ARTERY: There is a minimal amount of intimal thickening involving the mid (image 40) and distal (image 44) aspects of the left common carotid artery. There are no elevated peak systolic velocities within the interrogated course of the left  internal carotid artery to suggest a hemodynamically significant stenosis. LEFT VERTEBRAL ARTERY:  Antegrade flow IMPRESSION: 1. Very minimal amount of left-sided intimal thickening, not resulting in hemodynamically significant stenosis. 2. Unremarkable sonographic evaluation of the right carotid system. 3. Given poor sonographic window, if there remains clinical concern for hemodynamically significant stenosis, further evaluation with CTA could be performed as clinically indicated. Electronically Signed   By: Simonne Come M.D.   On: 03/02/2020 08:55     Assessment/Plan:  59 y.o. female with past medical history significant for CVA, depression, anxiety, arthritis, hypertension, peptic ulcer, cervical radiculopathy, and GERD with c/o Left sided paresthesias, mild left sided weakness, and mild dysarthria/word finding difficulties. She is s/p MRI with R thalamic stroke.  Pt was on ASA 81mg  at home and started on dual anti platelet therapy in the hospital. Patient was on ASA 81mg  TID at home.   - ASA 325 daily/statin - d/c planning as per primary team - call with any questions

## 2020-03-03 NOTE — Progress Notes (Signed)
Physical Therapy Treatment Patient Details Name: GEM CONKLE MRN: [id number] DOB: [birth year] Today's Date: 03/03/2020    History of Present Illness The patient is a 59 yr old woman who carries a past medical history significant for CVA, depression, anxiety, arthritis, hypertension, peptic ulcer, cervical radiculopathy, and GERD. She lives alone in a boarding house. She presented to the hospital ED on 03/01/2020 with one day history of numbness of the left side of her body. Mild dysarthria/word finding difficulties present. Patient had uncontrolled hypertension upon arrival. Admitted for potential stroke.  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 Permissive hypertension fro 24-48 hours post stroke.    PT Comments    Pt in bathroom bathing upon arrival.  No C/O dizziness noted and ready for session.  BP noted to be increased this am during rounds.  Discussed with MD who requested stair assessment to determine discharge plan.  She is able to walk to/from stairwell with no AD and slow steady gait.  She is able to walk up/dow steps with 1 rail and min guard.  She does not have rails at boarding house or anything to touch for stability.  She hesitantly is able to step up/down 3 steps but reaches for me each time for stability and is at high risk for falls on stairs without +1 assist or rails.  She wants to continue gait and is able to walk 100' with slow self selected speed and occasional standing rest breaks.  Gait distance limited by writer due to increased BP.  Upon sitting in recliner, BP 207/139 P 100 but no c/o dizziness or seizure activity noted.  RN in room.  Discussed discharge plan with pt at length.  She stated her landlord has not addressed railings at home and at this point.  She is unsafe to go up/down steps without rails or assist at this time putting her at increased risk for falls.  She does state she is able and plans to discharge home with her son who has stairs but has rails and will assist  her.  After discussion regarding status of boarding home it seems like a more appropriate discharge destination if family is able to assist her.  She stated she is the only woman in the home and uses a mop bucket in her room for a toilet as the community bathroom is not clean.    Follow Up Recommendations  Home health PT     Equipment Recommendations  3in1 (PT)    Recommendations for Other Services       Precautions / Restrictions Precautions Precautions: Fall Restrictions Weight Bearing Restrictions: No    Mobility  Bed Mobility Overal bed mobility: Independent                Transfers Overall transfer level: Modified independent Equipment used: None                Ambulation/Gait Ambulation/Gait assistance: Supervision Gait Distance (Feet): 100 Feet Assistive device: None   Gait velocity: slow       Stairs Stairs: Yes Stairs assistance: Min guard Stair Management: No rails Number of Stairs: 3 General stair comments: no rails at home or objects close to tough - practiced with no rails here - generally unsafe.  See note body for details   Wheelchair Mobility    Modified Rankin (Stroke Patients Only)       Balance Overall balance assessment: Mild deficits observed, not formally tested Sitting-balance support: No upper extremity supported;Feet supported Sitting balance-Leahy  Scale: Normal     Standing balance support: No upper extremity supported;During functional activity Standing balance-Leahy Scale: Good                              Cognition Arousal/Alertness: Awake/alert Behavior During Therapy: WFL for tasks assessed/performed Overall Cognitive Status: Within Functional Limits for tasks assessed                                        Exercises      General Comments        Pertinent Vitals/Pain Pain Assessment: No/denies pain    Home Living                      Prior Function             PT Goals (current goals can now be found in the care plan section) Progress towards PT goals: Progressing toward goals    Frequency    7X/week      PT Plan Current plan remains appropriate    Co-evaluation              AM-PAC PT "6 Clicks" Mobility   Outcome Measure  Help needed turning from your back to your side while in a flat bed without using bedrails?: None Help needed moving from lying on your back to sitting on the side of a flat bed without using bedrails?: None Help needed moving to and from a bed to a chair (including a wheelchair)?: A Little Help needed standing up from a chair using your arms (e.g., wheelchair or bedside chair)?: A Little Help needed to walk in hospital room?: A Little Help needed climbing 3-5 steps with a railing? : A Little 6 Click Score: 20    End of Session Equipment Utilized During Treatment: Gait belt Activity Tolerance: Patient tolerated treatment well;Treatment limited secondary to medical complications (Comment) Patient left: in chair;with call bell/phone within reach;with chair alarm set;with nursing/sitter in room Nurse Communication: Mobility status Hemiplegia - Right/Left: Left Hemiplegia - dominant/non-dominant: Non-dominant Hemiplegia - caused by: Cerebral infarction     Time: 0998-3382 PT Time Calculation (min) (ACUTE ONLY): 16 min  Charges:  $Gait Training: 8-22 mins                    Danielle Dess, PTA 03/03/20, 10:57 AM

## 2020-03-03 NOTE — Progress Notes (Signed)
PROGRESS NOTE  Elizabeth Coffey KGY:[id number] DOB: May 26, 1961 DOA: 03/01/2020 PCP: Lauro Regulus, MD  Brief History   The patient is a 59 yr old woman who carries a past medical history significant for CVA, depression, anxiety, arthritis, hypertension, peptic ulcer, cervical radiculopathy, and GERD. She lives alone in a boarding house. She presents with one day history of numbness of the left side of her body. She states that this is head to toe. She also admits to being more sleepy than usual for the last day. She states that she almost called 911 yesterday, but thought she would get better.   In the ED the patient is hypertensive with a normal temperature, heart rate, and respiratory rate. CT brain is negative for acute abnormality. Laboratory indices are for the most part within normal limits except for a potassium of 3.3 on the BMP. CBC is normal.  The patient denies fevers, chills, cough, shortness of breath, nausea, vomiting, constipation, diarrhea, rash, visual changes or lesions.  [facility] have been consulted to admit for further evaluation and treatment.  She has been admitted to a telemetry bed for a stroke work up.  MRI Brain has demonstrated right thalamic infarct. She is receiving plavix and aspirin. Echocardiogram and Carotid ultrasound has demonstrated no hemodynamically significant stenosis. Echocardiogram report is still pending. Marland Kitchen CTA head and neck has demonstrated no hemodynamically significant stenosis.   Consultants  . Neurology  Procedures  . None  Antibiotics   Anti-infectives (From admission, onward)   None     Subjective  The patient is resting comfortably. She feels that her speech has improved. She continues to have the numbness.  Objective   Vitals:  Vitals:   03/03/20 1211 03/03/20 1356  BP: (!) 236/145 (!) 198/130  Pulse: 99 94  Resp: 18 16  Temp: 98 F (36.7 C) 98.9 F (37.2 C)  SpO2: 94% 99%   General appearance: Alert,  awake, and oriented x 3.   No acute distress.  Eyes:  Conjunctivae/corneas clear. PERRLA, EOM's intact. Fundi benign.  Resp:  No increased work of breathing  No wheezes, rales, or rhonchi.   No tactile fremitus.  Cardio:Regular rate and rhythm  No murmurs, ectopy, or galllups  No lateral PMI. No thrills. GI:  Soft, non-tender, non-distended  Bowel sounds normal  No masses,  Organomegaly, or hernias are appreciated  Extremities: extremities normal, atraumatic, no cyanosis or edema  Skin:  Skin color, texture, turgor normal. No rashes or lesions  Neurologic: The patient is awake, alert, and oriented x 3. She is communicative and able to give a fair history. Her speech is somewhat thick and she occasionally has difficulty finding her words. CN II - XII grossly intact. Moving all extremities. Continues numbness of left side.  I have personally reviewed the following:   Today's Data  . Vitals, CBC, BMP, lipid panel   Imaging  . CT brain . MRI brain  Scheduled Meds: .  stroke: mapping our early stages of recovery book   Does not apply Once  . amLODipine  10 mg Oral Daily  . aspirin EC  325 mg Oral Daily  . DULoxetine  30 mg Oral Daily  . enoxaparin (LOVENOX) injection  40 mg Subcutaneous Q24H  . lisinopril  20 mg Oral Daily  . QUEtiapine  25 mg Oral QHS  . sodium chloride flush  3 mL Intravenous Q12H   Continuous Infusions:  Active Problems:   Depression with anxiety   Essential hypertension  Gastroesophageal reflux disease without esophagitis   Numbness and tingling of left arm and leg   Poorly-controlled hypertension   Lethargy   Stroke ([place])   LOS: 2 days   A & P  Acute CVA: 8 mm right thalimic infarct. She has been admitted to a telemetry bed. She is receiving aspirin and plavix. We have adopted a liberal approach to the patient's hypertension. Carotid dopplers and CTA head and neck, and echocardiogram. PT/OT has evaluated the patient.   Memory deficits: The  patient states that lately she has had difficulty remembering small things such as leaving items cooking on the stovetop. This is somewhat concerning for dementia. This will need to be further evaluated as outpatient when the patient is neurologically back at baseline.  Hypertension: Uncontrolled. Systolic pressures in the ED have run from 171 - 194. Diastolic pressures have run from 92 - 101. We have begun to add back the patient's home antihypertensives.  Depression and anxiety: Continue cymbalta, pamelor.  COPD: Noted and stable. Combivent inhaler to be used Q6H prn wheezing.  I have seen and examined this patient myself. I have spent 32 minutes in her evaluation and care.  CODE STATUS: Full Code DVT Prophylaxis: Heparin Family communication: None available Disposition: From boarding house that does not provide access to facilities for bathing or toileting. I anticipate that the patient will be discharged to her son's home once echocardiogram report is available to review.  [name], DO [facility] Direct contact: see [email or web address]  7PM-7AM contact night coverage as above 03/03/2020, 3:28 PM  LOS: 1 day

## 2020-03-03 NOTE — Plan of Care (Signed)
  Problem: Intracerebral Hemorrhage Tissue Perfusion: Goal: Complications of Intracerebral Hemorrhage will be minimized Outcome: Progressing   Problem: Ischemic Stroke/TIA Tissue Perfusion: Goal: Complications of ischemic stroke/TIA will be minimized Outcome: Progressing   

## 2020-03-03 NOTE — Progress Notes (Signed)
   Vital Signs MEWS/VS Documentation      03/03/2020 0744 03/03/2020 0900 03/03/2020 1011 03/03/2020 1039   MEWS Score:  0  0  3  2   MEWS Score Color:  Green  Green  Yellow  Yellow   Resp:  16  --  --  18   Pulse:  78  --  --  99   BP:  (!) 199/105  --  --  (!) 213/107   Temp:  98 F (36.7 C)  --  --  98.7 F (37.1 C)   O2 Device:  Room Air  --  --  Room Air   Level of Consciousness:  --  --  --  Alert      Dr. Gerri Lins notified, see MEWS flowsheet.     Jos Cygan 03/03/2020,10:44 AM

## 2020-03-03 NOTE — Progress Notes (Signed)
   Vital Signs MEWS/VS Documentation      03/03/2020 1110 03/03/2020 1211 03/03/2020 1356 03/03/2020 1602   MEWS Score:  2  2  0  3   MEWS Score Color:  Yellow  Yellow  Green  Yellow   Resp:  16  18  16  18    Pulse:  97  99  94  (!) 105   BP:  (!) 205/140  (!) 236/145  (!) 198/130  (!) 218/135   Temp:  98.2 F (36.8 C)  98 F (36.7 C)  98.9 F (37.2 C)  98 F (36.7 C)   O2 Device:  Room      See MEWS flowsheet, and MAR. Notified Dr. [facility] BP is still extremely elevated despite restarting patient's Norvasc and lisinopril, and one time dose IV labetalol. See MAR.     Cris Talavera 03/03/2020,4:50 PM

## 2020-03-04 ENCOUNTER — Inpatient Hospital Stay (HOSPITAL_COMMUNITY)
Admit: 2020-03-04 | Discharge: 2020-03-04 | Disposition: A | Discharge status: Home / Self Care | Payer: Medicare Other | Attending: Internal Medicine | Admitting: Internal Medicine

## 2020-03-04 DIAGNOSIS — I6389 Other cerebral infarction: Secondary | ICD-10-CM

## 2020-03-04 LAB — ECHOCARDIOGRAM COMPLETE
Height: 64 in
Weight: 3294.4 oz

## 2020-03-04 MED ORDER — TRAMADOL HCL 50 MG PO TABS: 50.0000 mg | ORAL_TABLET | Freq: Four times a day (QID) | ORAL | 0 refills | Status: DC | PRN

## 2020-03-04 MED ORDER — CLOPIDOGREL BISULFATE 75 MG PO TABS: 75.0000 mg | ORAL_TABLET | Freq: Every day | ORAL | 11 refills | Status: AC

## 2020-03-04 MED ORDER — SIMVASTATIN 20 MG PO TABS: 20.0000 mg | ORAL_TABLET | Freq: Every evening | ORAL | 11 refills | Status: DC

## 2020-03-04 MED ORDER — STROKE: EARLY STAGES OF RECOVERY BOOK: 1.0000 | Freq: Once | 0 refills | Status: AC

## 2020-03-04 MED ORDER — ASPIRIN EC 81 MG PO TBEC: 81.0000 mg | DELAYED_RELEASE_TABLET | Freq: Every day | ORAL | 0 refills | Status: AC

## 2020-03-04 NOTE — Discharge Summary (Signed)
Physician Discharge Summary  Elizabeth Coffey ZGY:[id number] DOB: October 28, 1961 DOA: 03/01/2020  PCP: Kirk Ruths, MD  Admit date: 03/01/2020 Discharge date: 03/04/2020  Recommendations for Outpatient Follow-up:  1. Follow up with PCP in 7-10 days. 2. No driving until cleared by PCP. 3. Follow up with stroke clinic in 6 weeks.  Follow-up Information    [facility] Follow up.   Specialty: [place] Why: Physical Therapy, Occupational Therapy, Speech Therapy Contact information: [address] [place] [address] [phone]          Discharge Diagnoses: Principal diagnosis is #1 1. 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 Left sided paresthesias, dysarthria 3. Hypertension 4. Hyperlipidemia  Discharge Condition: Fair  Disposition: Patient is being discharged to home with her son.  Diet recommendation: Heart healthy  Filed Weights   03/01/20 2132 03/02/20 0357 03/03/20 0017  Weight: 93.5 kg 93.3 kg 93.4 kg   History of present illness:  The patient is a 59 yr old woman who carries a past medical history significant for CVA, depression, anxiety, arthritis, hypertension, peptic ulcer, cervical radiculopathy, and GERD. She lives alone in a boarding house. She presents with one day history of numbness of the left side of her body. She states that this is head to toe. She also admits to being more sleepy than usual for the last day. She states that she almost called 911 yesterday, but thought she would get better.   In the ED the patient is hypertensive with a normal temperature, heart rate, and respiratory rate. CT brain is negative for acute abnormality. Laboratory indices are for the most part within normal limits except for a potassium of 3.3 on the BMP. CBC is normal.  The patient denies fevers, chills, cough, shortness of breath, nausea, vomiting, constipation, diarrhea, rash, visual changes or lesions.  [facility] have been consulted  to admit for further evaluation and treatment.  She has been admitted to a telemetry bed for a stroke work up.  Hospital Course:  The patient is a 59 yr old woman who carries a past medical history significant for CVA, depression, anxiety, arthritis, hypertension, peptic ulcer, cervical radiculopathy, and GERD. She lives alone in a boarding house. She presents with one day history of numbness of the left side of her body. She states that this is head to toe. She also admits to being more sleepy than usual for the last day. She states that she almost called 911 yesterday, but thought she would get better.   In the ED the patient is hypertensive with a normal temperature, heart rate, and respiratory rate. CT brain is negative for acute abnormality. Laboratory indices are for the most part within normal limits except for a potassium of 3.3 on the BMP. CBC is normal.  The patient denies fevers, chills, cough, shortness of breath, nausea, vomiting, constipation, diarrhea, rash, visual changes or lesions.  [facility] have been consulted to admit for further evaluation and treatment.  She has been admitted to a telemetry bed for a stroke work up.  MRI Brain has demonstrated right thalamic infarct. She is receiving plavix and aspirin. Echocardiogram and Carotid ultrasound has demonstrated no hemodynamically significant stenosis. Echocardiogram has resulted and demonstrates EF of 50-55% with no intracardiac thrombus, and no diastolic dysfunction. CTA head and neck has demonstrated no hemodynamically significant stenosis.   The patient has been evaluated by PT. They have recommended home with home health PT.   The patient will  be discharged to home with her son today with home health, ASA, Plavix x 21 days, Simvastatin, and follow up with stroke clinic in 6 weeks.  Today's assessment: S: The patient is resting comfortably. No new complaints. O: Vitals:  Vitals:   03/04/20 0807 03/04/20  1225  BP: (!) 143/76 (!) 141/78  Pulse: 60 64  Resp: 18 18  Temp: 97.7 F (36.5 C) 97.7 F (36.5 C)  SpO2: 99% 100%    General appearance:Alert, awake, and oriented x 3.              No acute distress.  Eyes:   Conjunctivae/corneas clear. PERRLA, EOM's intact. Fundi benign.  Resp:   No increased work of breathing             No wheezes, rales, or rhonchi.              No tactile fremitus.  Cardio:Regular rate and rhythm             No murmurs, ectopy, or galllups             No lateral PMI. No thrills. GI:       Soft, non-tender, non-distended             Bowel sounds normal             No masses,  Organomegaly, or hernias are appreciated  Extremities:extremities normal, atraumatic, no cyanosis or edema  Skin:    Skin color, texture, turgor normal. No rashes or lesions  Neurologic:The patient is awake, alert, and oriented x 3. She is communicative and able to give a fair history. Her speech is improved. CN II - XII grossly intact. Moving all extremities. Improved numbness of left side.  Discharge Instructions  Discharge Instructions    Activity as tolerated - No restrictions   Complete by: As directed    Activity as tolerated - No restrictions   Complete by: As directed    Call MD for:   Complete by: As directed    Neurological deficits   Call MD for:  persistant dizziness or light-headedness   Complete by: As directed    Call MD for:  persistant nausea and vomiting   Complete by: As directed    Call MD for:  severe uncontrolled pain   Complete by: As directed    Diet - low sodium heart healthy   Complete by: As directed    Discharge instructions   Complete by: As directed    Follow up with PCP in 7-10 days. No driving until cleared by PCP. Follow up with stroke clinic in 6 weeks.   Increase activity slowly   Complete by: As directed    Increase activity slowly   Complete by: As directed      Allergies as of 03/04/2020      Reactions    Hydrocodone-acetaminophen Anaphylaxis   Aspirin Itching   Mobic [meloxicam] Other (See Comments)   Light headed and vomiting.    Tramadol Other (See Comments)   Stomach pain    Chocolate Itching, Rash   Cocoa Itching, Rash      Medication List    STOP taking these medications   acyclovir 800 MG tablet Commonly known as: ZOVIRAX   cloNIDine 0.2 MG tablet Commonly known as: CATAPRES   cyclobenzaprine 10 MG tablet Commonly known as: FLEXERIL   guaiFENesin-codeine 100-10 MG/5ML syrup   hydrochlorothiazide 25 MG tablet Commonly known as: HYDRODIURIL   meloxicam 15 MG tablet  Commonly known as: MOBIC   methylPREDNISolone 4 MG Tbpk tablet Commonly known as: MEDROL DOSEPAK   metoprolol succinate 50 MG 24 hr tablet Commonly known as: TOPROL-XL   pseudoephedrine 60 MG tablet Commonly known as: SUDAFED   tiZANidine 2 MG tablet Commonly known as: ZANAFLEX   triamcinolone 0.025 % ointment Commonly known as: KENALOG   triamterene-hydrochlorothiazide 37.5-25 MG tablet Commonly known as: MAXZIDE-25     TAKE these medications    stroke: mapping our early stages of recovery book Misc 1 each by Does not apply route once for 1 dose.   albuterol-ipratropium 18-103 MCG/ACT inhaler Commonly known as: Combivent Inhale 1 puff into the lungs every 4 (four) hours as needed for wheezing or shortness of breath.   amLODipine-benazepril 10-20 MG capsule Commonly known as: LOTREL Take 1 capsule by mouth daily.   aspirin EC 81 MG tablet Take 1 tablet (81 mg total) by mouth daily. What changed:   how much to take  when to take this   clopidogrel 75 MG tablet Commonly known as: Plavix Take 1 tablet (75 mg total) by mouth daily.   DULoxetine 30 MG capsule Commonly known as: CYMBALTA TAKE 1 CAPSULE (30 MG TOTAL) BY MOUTH DAILY. FOR MOOD   linaclotide 290 MCG Caps capsule Commonly known as: LINZESS Take by mouth.   nortriptyline 10 MG capsule Commonly known as:  PAMELOR Take 2 capsules (20 mg total) by mouth at bedtime.   omeprazole 40 MG capsule Commonly known as: PRILOSEC Take 40 mg by mouth daily.   QUEtiapine 25 MG tablet Commonly known as: SEROQUEL TAKE 1 TABLET (25 MG TOTAL) BY MOUTH AT BEDTIME. FOR MOOD AND SLEEP   simvastatin 20 MG tablet Commonly known as: Zocor Take 1 tablet (20 mg total) by mouth every evening.   traMADol 50 MG tablet Commonly known as: ULTRAM Take 1 tablet (50 mg total) by mouth every 6 (six) hours as needed for moderate pain (headache).            Durable Medical Equipment  (From admission, onward)         Start     Ordered   03/04/20 1141  DME 3-in-1  Once     03/04/20 1149         Allergies  Allergen Reactions  . Hydrocodone-Acetaminophen Anaphylaxis  . Aspirin Itching  . Mobic [Meloxicam] Other (See Comments)    Light headed and vomiting.   . Tramadol Other (See Comments)    Stomach pain   . Chocolate Itching and Rash  . Cocoa Itching and Rash    The results of significant diagnostics from this hospitalization (including imaging, microbiology, ancillary and laboratory) are listed below for reference.    Significant Diagnostic Studies: CT ANGIO HEAD W OR WO CONTRAST  Result Date: 03/02/2020 CLINICAL DATA:  Left-sided tingling and numbness. Acute right thalamic infarction. EXAM: CT ANGIOGRAPHY HEAD AND NECK TECHNIQUE: Multidetector CT imaging of the head and neck was performed using the standard protocol during bolus administration of intravenous contrast. Multiplanar CT image reconstructions and MIPs were obtained to evaluate the vascular anatomy. Carotid stenosis measurements (when applicable) are obtained utilizing NASCET criteria, using the distal internal carotid diameter as the denominator. CONTRAST:  75mL OMNIPAQUE IOHEXOL 350 MG/ML SOLN COMPARISON:  MRI 03/01/2020 FINDINGS: CT HEAD FINDINGS Brain: 1 cm focal low-density noted in the lateral right thalamus consistent with the acute  stroke shown by MRI. Elsewhere, the brain appears normal by CT. Small-vessel ischemic changes of the white matter  and left thalamus shown by MRI are not appreciable. Vascular: There is atherosclerotic calcification of the major vessels at the base of the brain. Skull: Negative Sinuses: Clear Orbits: Normal Review of the MIP images confirms the above findings CTA NECK FINDINGS Aortic arch: Minimal aortic atherosclerotic plaque. No aneurysm or dissection. Branching pattern is normal without origin stenosis. Right carotid system: Common carotid artery widely patent to the bifurcation. Minimal soft plaque in the ICA bulb but no stenosis. Cervical ICA widely patent beyond the bulb. Left carotid system: Common carotid artery widely patent to the bifurcation region. Carotid bifurcation is normal. No stenosis or irregularity. Cervical ICA is normal. Vertebral arteries: Both vertebral artery origins are widely patent. Both vertebral arteries appear normal through the cervical region to the foramen magnum. Skeleton: Ordinary cervical spondylosis. Other neck: No mass or lymphadenopathy. Upper chest: Normal Review of the MIP images confirms the above findings CTA HEAD FINDINGS Anterior circulation: Both internal carotid arteries are patent through the skull base. There is ordinary siphon atherosclerotic calcification but without stenosis greater than 30% on either side. The anterior and middle cerebral vessels are patent without proximal stenosis, aneurysm or vascular malformation. No large or medium vessel occlusion. Posterior circulation: Both vertebral arteries widely patent through the foramen magnum to the basilar. Moderate atherosclerotic irregularity of the basilar artery but without flow limiting stenosis. Maximal stenosis in the proximal half is estimated at 30-50%. Superior cerebellar and posterior cerebral arteries show flow. There is atherosclerotic irregularity of the PCA branches, much more extensive on the right  than left. Venous sinuses: Patent and normal. Anatomic variants: None significant. Review of the MIP images confirms the above findings IMPRESSION: Minimal aortic atherosclerosis. Mild nonstenotic plaque at the right carotid bifurcation. Left carotid bifurcation appears normal. No vertebral artery origin stenosis or cervical vertebral artery pathology. 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 Atherosclerotic disease of the posterior cerebral artery branches, more extensive on the right than the left. No correctable proximal stenosis. Electronically Signed   By: Paulina Fusi M.D.   On: 03/02/2020 22:02   CT HEAD WO CONTRAST  Result Date: 03/01/2020 CLINICAL DATA:  Left-sided numbness. EXAM: CT HEAD WITHOUT CONTRAST TECHNIQUE: Contiguous axial images were obtained from the base of the skull through the vertex without intravenous contrast. COMPARISON:  December 02, 2017. FINDINGS: Brain: Mild chronic ischemic white matter disease. No mass effect or midline shift is noted. Ventricular size is within normal limits. There is no evidence of mass lesion, hemorrhage or acute infarction. Vascular: No hyperdense vessel or unexpected calcification. Skull: Normal. Negative for fracture or focal lesion. Sinuses/Orbits: No acute finding. Other: None. IMPRESSION: Mild chronic ischemic white matter disease. No acute intracranial abnormality seen. Electronically Signed   By: Lupita Raider M.D.   On: 03/01/2020 14:40   CT ANGIO NECK W OR WO CONTRAST  Result Date: 03/02/2020 CLINICAL DATA:  Left-sided tingling and numbness. Acute right thalamic infarction. EXAM: CT ANGIOGRAPHY HEAD AND NECK TECHNIQUE: Multidetector CT imaging of the head and neck was performed using the standard protocol during bolus administration of intravenous contrast. Multiplanar CT image reconstructions and MIPs were obtained to evaluate  the vascular anatomy. Carotid stenosis measurements (when applicable) are obtained utilizing NASCET criteria, using the distal internal carotid diameter as the denominator. CONTRAST:  75mL OMNIPAQUE IOHEXOL 350 MG/ML SOLN COMPARISON:  MRI 03/01/2020 FINDINGS: CT HEAD FINDINGS Brain: 1 cm focal low-density noted in  the lateral right thalamus consistent with the acute stroke shown by MRI. Elsewhere, the brain appears normal by CT. Small-vessel ischemic changes of the white matter and left thalamus shown by MRI are not appreciable. Vascular: There is atherosclerotic calcification of the major vessels at the base of the brain. Skull: Negative Sinuses: Clear Orbits: Normal Review of the MIP images confirms the above findings CTA NECK FINDINGS Aortic arch: Minimal aortic atherosclerotic plaque. No aneurysm or dissection. Branching pattern is normal without origin stenosis. Right carotid system: Common carotid artery widely patent to the bifurcation. Minimal soft plaque in the ICA bulb but no stenosis. Cervical ICA widely patent beyond the bulb. Left carotid system: Common carotid artery widely patent to the bifurcation region. Carotid bifurcation is normal. No stenosis or irregularity. Cervical ICA is normal. Vertebral arteries: Both vertebral artery origins are widely patent. Both vertebral arteries appear normal through the cervical region to the foramen magnum. Skeleton: Ordinary cervical spondylosis. Other neck: No mass or lymphadenopathy. Upper chest: Normal Review of the MIP images confirms the above findings CTA HEAD FINDINGS Anterior circulation: Both internal carotid arteries are patent through the skull base. There is ordinary siphon atherosclerotic calcification but without stenosis greater than 30% on either side. The anterior and middle cerebral vessels are patent without proximal stenosis, aneurysm or vascular malformation. No large or medium vessel occlusion. Posterior circulation: Both vertebral arteries  widely patent through the foramen magnum to the basilar. Moderate atherosclerotic irregularity of the basilar artery but without flow limiting stenosis. Maximal stenosis in the proximal half is estimated at 30-50%. Superior cerebellar and posterior cerebral arteries show flow. There is atherosclerotic irregularity of the PCA branches, much more extensive on the right than left. Venous sinuses: Patent and normal. Anatomic variants: None significant. Review of the MIP images confirms the above findings IMPRESSION: Minimal aortic atherosclerosis. Mild nonstenotic plaque at the right carotid bifurcation. Left carotid bifurcation appears normal. No vertebral artery origin stenosis or cervical vertebral artery pathology. 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 Atherosclerotic disease of the posterior cerebral artery branches, more extensive on the right than the left. No correctable proximal stenosis. Electronically Signed   By: Paulina Fusi M.D.   On: 03/02/2020 22:02   MR BRAIN WO CONTRAST  Result Date: 03/01/2020 CLINICAL DATA:  Neuro deficit, acute, stroke suspected. Additional history provided: 59 year old female with history of hypertension and strokes who presents with left-sided tingling and numbness which began yesterday morning. EXAM: MRI HEAD WITHOUT CONTRAST TECHNIQUE: Multiplanar, multiecho pulse sequences of the brain and surrounding structures were obtained without intravenous contrast. COMPARISON:  Noncontrast head CT performed earlier the same day 03/01/2020, brain MRI 10/16/2019 FINDINGS: Brain: There is an 8 mm focus of restricted diffusion within the right thalamus consistent with acute infarction (series 5, image 24). Corresponding T2/FLAIR hyperintensity at this site. No acute infarct is identified elsewhere within the brain. Unchanged background mild chronic small vessel  ischemic disease. No evidence of intracranial mass. No midline shift or extra-axial fluid collection. No chronic intracranial blood products. Cerebral volume is normal for age. Vascular: Flow voids maintained within the proximal large arterial vessels. Skull and upper cervical spine: No focal marrow lesion Sinuses/Orbits: Visualized orbits demonstrate no acute abnormality. Trace ethmoid sinus mucosal thickening. No significant mastoid effusion IMPRESSION: 8 mm acute right thalamic infarct. This is superimposed upon a chronic infarct at this site. Stable mild background chronic small vessel ischemic disease.  Electronically Signed   By: Jackey LogeKyle  Golden DO   On: 03/01/2020 18:39   [facility]id [facility] and [facility])  Result Date: 03/02/2020 CLINICAL DATA:  Stroke like symptoms.  History of hypertension. EXAM: BILATERAL CAROTID DUPLEX ULTRASOUND TECHNIQUE: Wallace CullensGray scale imaging, color Doppler and duplex ultrasound were performed of bilateral carotid and vertebral arteries in the neck. COMPARISON:  None. FINDINGS: Examination is degraded due to patient body habitus and poor sonographic window. Criteria: Quantification of carotid stenosis is based on velocity parameters that correlate the residual internal carotid diameter with NASCET-based stenosis levels, using the diameter of the distal internal carotid lumen as the denominator for stenosis measurement. The following velocity measurements were obtained: RIGHT ICA: 77/36 cm/sec CCA: 76/23 cm/sec SYSTOLIC ICA/CCA RATIO:  1.0 ECA: 65 cm/sec LEFT ICA: 60/25 cm/sec CCA: 73/23 cm/sec SYSTOLIC ICA/CCA RATIO:  0.8 ECA: 75 cm/sec RIGHT CAROTID ARTERY: There is no grayscale evidence of significant intimal thickening or atherosclerotic plaque affecting the interrogated portions of the right carotid system. There are no elevated peak systolic velocities within the interrogated course of the right internal carotid artery to suggest a hemodynamically significant stenosis. RIGHT  VERTEBRAL ARTERY:  Antegrade flow LEFT CAROTID ARTERY: There is a minimal amount of intimal thickening involving the mid (image 40) and distal (image 44) aspects of the left common carotid artery. There are no elevated peak systolic velocities within the interrogated course of the left internal carotid artery to suggest a hemodynamically significant stenosis. LEFT VERTEBRAL ARTERY:  Antegrade flow IMPRESSION: 1. Very minimal amount of left-sided intimal thickening, not resulting in hemodynamically significant stenosis. 2. Unremarkable sonographic evaluation of the right carotid system. 3. Given poor sonographic window, if there remains clinical concern for hemodynamically significant stenosis, further evaluation with CTA could be performed as clinically indicated. Electronically Signed   By: Simonne ComeJohn  Watts M.D.   On: 03/02/2020 08:55   ECHOCARDIOGRAM COMPLETE  Result Date: 03/04/2020    ECHOCARDIOGRAM REPORT   Patient Name:   Marcelo BaldyMARY A Lascola Date of Exam: 03/04/2020 Medical Rec #:  [id number]  Height:       64.0 in Accession #:    [id number] Weight:       205.9 lb Date of Birth:  1961-09-16  BSA:          1.981 m Patient Age:    58 years   BP:           143/76 mmHg Patient Gender: F          HR:           60 bpm. Exam Location:  [facility] Procedure: 2D Echo, Cardiac Doppler and Color Doppler Indications:     Stroke 434.91  History:         Patient has no prior history of Echocardiogram examinations.                  Risk Factors:Hypertension.  Sonographer:     Cristela BlueJerry Hege RDCS (AE) Referring Phys:  [id number] Annielee Jemmott Gerri LinsSWAYZE Diagnosing Phys: Lorine BearsMuhammad Arida MD IMPRESSIONS  1. Left ventricular ejection fraction, by estimation, is 65 to 70%. The left ventricle has normal function. The left ventricle has no regional wall motion abnormalities. There is moderate asymmetric left ventricular hypertrophy (septal >posterior wall) . Left ventricular diastolic parameters were normal.  2. Right ventricular systolic function is normal. The right  ventricular size is normal. Tricuspid regurgitation signal is inadequate for assessing PA pressure.  3. The mitral valve is normal in structure. No  evidence of mitral valve regurgitation. No evidence of mitral stenosis.  4. The aortic valve is normal in structure. Aortic valve regurgitation is not visualized. Mild aortic valve sclerosis is present, with no evidence of aortic valve stenosis. Conclusion(s)/Recommendation(s): No intracardiac source of embolism detected on this transthoracic study. FINDINGS  Left Ventricle: Left ventricular ejection fraction, by estimation, is 65 to 70%. The left ventricle has normal function. The left ventricle has no regional wall motion abnormalities. The left ventricular internal cavity size was normal in size. There is  moderate asymmetric left ventricular hypertrophy. Left ventricular diastolic parameters were normal. Right Ventricle: The right ventricular size is normal. No increase in right ventricular wall thickness. Right ventricular systolic function is normal. Tricuspid regurgitation signal is inadequate for assessing PA pressure. The tricuspid regurgitant velocity is 1.81 m/s, and with an assumed right atrial pressure of 10 mmHg, the estimated right ventricular systolic pressure is 23.1 mmHg. Left Atrium: Left atrial size was normal in size. Right Atrium: Right atrial size was normal in size. Pericardium: There is no evidence of pericardial effusion. Mitral Valve: The mitral valve is normal in structure. There is mild calcification of the mitral valve leaflet(s). Normal mobility of the mitral valve leaflets. No evidence of mitral valve regurgitation. No evidence of mitral valve stenosis. Tricuspid Valve: The tricuspid valve is normal in structure. Tricuspid valve regurgitation is not demonstrated. No evidence of tricuspid stenosis. Aortic Valve: The aortic valve is normal in structure. Aortic valve regurgitation is not visualized. Mild aortic valve sclerosis is present,  with no evidence of aortic valve stenosis. Aortic valve mean gradient measures 3.0 mmHg. Aortic valve peak gradient measures 5.2 mmHg. Aortic valve area, by VTI measures 2.80 cm. Pulmonic Valve: The pulmonic valve was normal in structure. Pulmonic valve regurgitation is not visualized. No evidence of pulmonic stenosis. Aorta: The aortic root is normal in size and structure. Venous: The inferior vena cava was not well visualized. IAS/Shunts: No atrial level shunt detected by color flow Doppler.  LEFT VENTRICLE PLAX 2D LVIDd:         2.77 cm  Diastology LVIDs:         1.55 cm  LV e' lateral:   5.33 cm/s LV PW:         1.41 cm  LV E/e' lateral: 12.4 LV IVS:        1.88 cm  LV e' medial:    5.77 cm/s LVOT diam:     2.00 cm  LV E/e' medial:  11.4 LV SV:         60 LV SV Index:   30 LVOT Area:     3.14 cm  RIGHT VENTRICLE RV Basal diam:  3.00 cm RV S prime:     12.20 cm/s TAPSE (M-mode): 3.0 cm LEFT ATRIUM             Index       RIGHT ATRIUM           Index LA diam:        3.20 cm 1.62 cm/m  RA Area:     15.70 cm LA Vol (A2C):   32.3 ml 16.31 ml/m RA Volume:   41.70 ml  21.05 ml/m LA Vol (A4C):   35.5 ml 17.92 ml/m LA Biplane Vol: 33.7 ml 17.01 ml/m  AORTIC VALVE                   PULMONIC VALVE AV Area (Vmax):    2.03 cm  RVOT Peak grad: 2 mmHg AV Area (Vmean):   2.31 cm AV Area (VTI):     2.80 cm AV Vmax:           114.50 cm/s AV Vmean:          73.300 cm/s AV VTI:            0.214 m AV Peak Grad:      5.2 mmHg AV Mean Grad:      3.0 mmHg LVOT Vmax:         73.90 cm/s LVOT Vmean:        53.900 cm/s LVOT VTI:          0.191 m LVOT/AV VTI ratio: 0.89  AORTA Ao Root diam: 2.90 cm MITRAL VALVE               TRICUSPID VALVE MV Area (PHT): 2.54 cm    TR Peak grad:   13.1 mmHg MV Decel Time: 299 msec    TR Vmax:        181.00 cm/s MV E velocity: 66.00 cm/s MV A velocity: 82.50 cm/s  SHUNTS MV E/A ratio:  0.80        Systemic VTI:  0.19 m                            Systemic Diam: 2.00 cm Lorine Bears MD  Electronically signed by Lorine Bears MD Signature Date/Time: 03/04/2020/10:32:18 AM    Final     Microbiology: Recent Results (from the past 240 hour(s))  SARS CORONAVIRUS 2 (TAT 6-24 HRS) Nasopharyngeal Nasopharyngeal Swab     Status: None   Collection Time: 03/01/20  3:52 PM   Specimen: Nasopharyngeal Swab  Result Value Ref Range Status   SARS Coronavirus 2 NEGATIVE NEGATIVE Final    Comment: (NOTE) SARS-CoV-2 target nucleic acids are NOT DETECTED. The SARS-CoV-2 RNA is generally detectable in upper and lower respiratory specimens during the acute phase of infection. Negative results do not preclude SARS-CoV-2 infection, do not rule out co-infections with other pathogens, and should not be used as the sole basis for treatment or other patient management decisions. Negative results must be combined with clinical observations, patient history, and epidemiological information. The expected result is Negative. Fact Sheet for Patients: HairSlick.no Fact Sheet for Healthcare Providers: quierodirigir.com This test is not yet approved or cleared by the [place] FDA and  has been authorized for detection and/or diagnosis of SARS-CoV-2 by FDA under an Emergency Use Authorization (EUA). This EUA will remain  in effect (meaning this test can be used) for the duration of the COVID-19 declaration under Section 56 4(b)(1) of the Act, 21 U.S.C. section 360bbb-3(b)(1), unless the authorization is terminated or revoked sooner. Performed at [facility], [address], [place], [place] [address]      Labs: Basic Metabolic Panel: Recent Labs  Lab 03/01/20 1400 03/02/20 0448 03/03/20 0446  NA 143 145 140  K 3.3* 3.0* 3.7  CL 112* 114* 104  CO2 24 24 25   GLUCOSE 141* 97 128*  BUN 16 14 17   CREATININE 0.99 0.81 0.92  CALCIUM 9.3 8.9 9.5   Liver Function Tests: Recent Labs  Lab 03/01/20 1400  AST 22  ALT 18  ALKPHOS  86  BILITOT 0.4  PROT 7.5  ALBUMIN 4.0   No results for input(s): LIPASE, AMYLASE in the last 168 hours. No results for input(s): AMMONIA in the last 168  hours. CBC: Recent Labs  Lab 03/01/20 1400 03/02/20 0448 03/03/20 0446  WBC 5.6 6.0 6.9  NEUTROABS 3.5  --   --   HGB 12.8 12.0 12.9  HCT 39.7 37.1 40.1  MCV 81.4 81.0 81.0  PLT 268 259 264   Cardiac Enzymes: No results for input(s): CKTOTAL, CKMB, CKMBINDEX, TROPONINI in the last 168 hours. BNP: BNP (last 3 results) No results for input(s): BNP in the last 8760 hours.  ProBNP (last 3 results) No results for input(s): PROBNP in the last 8760 hours.  CBG: Recent Labs  Lab 03/01/20 1720  GLUCAP 96    Active Problems:   Depression with anxiety   Essential hypertension   Gastroesophageal reflux disease without esophagitis   Numbness and tingling of left arm and leg   Poorly-controlled hypertension   Lethargy   Stroke [facility])   Time coordinating discharge: 38 minutes  Signed:        Michalene Debruler, DO [facility]  03/04/2020, 4:51 PM

## 2020-03-04 NOTE — TOC Transition Note (Signed)
Transition of Care [facility]) - CM/SW Discharge Note   Patient Details  Name: Elizabeth Coffey MRN: [id number] Date of Birth: [birth year]  Transition of [facility]) CM/SW Contact:  Maree Krabbe, LCSW Phone Number: 03/04/2020, 1:06 PM   Clinical Narrative:   Pt will be services by [facility]. 3n1 was delivered at bedside. No other needs noted at this time. Pt's son will transport pt home.    Final next level of care: Home w Home Health Services Barriers to Discharge: No Barriers Identified   Patient Goals and CMS Choice        Discharge Placement                Patient to be transferred to facility by: Pt's son will pick pt up Name of family member notified: Pt alert and oriented Patient and family notified of of transfer: 03/04/20  Discharge Plan and Services In-house Referral: NA   Post Acute Care Choice: Home Health          DME Arranged: 3-N-1 DME Agency: [employer] Date DME Agency Contacted: 03/04/20 Time DME Agency Contacted: 1139 Representative spoke with at DME Agency: Nida Boatman HH Arranged: PT, OT, Speech Therapy HH Agency: [facility] Date HH Agency Contacted: 03/04/20 Time HH Agency Contacted: 1306 Representative spoke with at [facility] Agency: Rosey Bath  Social Determinants of Health (SDOH) Interventions     Readmission Risk Interventions No flowsheet data found.

## 2020-03-04 NOTE — Progress Notes (Signed)
Occupational Therapy Treatment Patient Details Name: Elizabeth Coffey MRN: [id number] DOB: [birth year] Today's Date: 03/04/2020    History of present illness The patient is a 59 yr old woman who carries a past medical history significant for CVA, depression, anxiety, arthritis, hypertension, peptic ulcer, cervical radiculopathy, and GERD. She lives alone in a boarding house. She presented to the hospital ED on 03/01/2020 with one day history of numbness of the left side of her body. Mild dysarthria/word finding difficulties present. Patient had uncontrolled hypertension upon arrival. Admitted for potential stroke.  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 Permissive hypertension fro 24-48 hours post stroke.   OT comments  Patient polite and agreeable to therapy.  No pain noted.  Patient plans to discharge home this date.  Provided education on HEP for B UEs/FMC, use of energy conservation techniques, and general safety techniques for improved safety within home.  Patient verbalized understanding.  Patient able to complete toileting and grooming at sink with MOD I and no use of AE/DME.  Provided further education on modifying BSC.  Patient verbalized understanding.  Patient with no further questions at this time.  Based on today's performance, continuing to recommend [facility] OT to continue.   Follow Up Recommendations  Home health OT    Equipment Recommendations  3 in 1 bedside commode    Recommendations for Other Services      Precautions / Restrictions Precautions Precautions: Fall Restrictions Weight Bearing Restrictions: No       Mobility Bed Mobility Overal bed mobility: Independent                Transfers Overall transfer level: Modified independent Equipment used: None Transfers: Sit to/from [employer] Sit to Stand: Modified independent (Device/Increase time) Stand pivot transfers: Modified independent (Device/Increase time)       General transfer comment:  Demonstrates improved impulse control and safety.    Balance                                           ADL either performed or assessed with clinical judgement   ADL Overall ADL's : Modified independent                                       General ADL Comments: Patient able to perform toileting and grooming at sink with MOD I.     Vision   Additional Comments: Patient states her vision is no longer blurry.   Perception     Praxis      Cognition Arousal/Alertness: Awake/alert Behavior During Therapy: WFL for tasks assessed/performed Overall Cognitive Status: Within Functional Limits for tasks assessed                                          Exercises Other Exercises Other Exercises: Patient educated in HEP.  Demonstrated understanding. Other Exercises: Educated patient in modification of BSC (height) Other Exercises: Educated patient on use of energy conservation techniques within home to improve safety   Shoulder Instructions       General Comments Patient verbalizes feeling "much better" today and is excited to potentially return home this date.    Pertinent Vitals/ Pain  Pain Assessment: No/denies pain  Home Living                                          Prior Functioning/Environment              Frequency  Min 2X/week        Progress Toward Goals  OT Goals(current goals can now be found in the care plan section)  Progress towards OT goals: Progressing toward goals     Plan Discharge plan remains appropriate;Frequency remains appropriate    Co-evaluation                 AM-PAC OT "6 Clicks" Daily Activity     Outcome Measure                    End of Session    OT Visit Diagnosis: History of falling (Z91.81);Hemiplegia and hemiparesis;Other abnormalities of gait and mobility (R26.89) Hemiplegia - Right/Left: Left Hemiplegia -  dominant/non-dominant: Non-Dominant 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

## 2020-03-04 NOTE — Progress Notes (Signed)
   03/04/20 1308  Clinical Encounter Type  Visited With Patient  Visit Type Follow-up  Referral From Nurse  Consult/Referral To Chaplain  Spiritual Encounters  Spiritual Needs Prayer;Emotional  CH followed up with pt per request from previous on-call CH. Pt was alert and awake upon arrival. Pt displayed no signs of pain. Was talkative throughout visit. CH provided pastoral care through active/reflective listening, validation of emotions and prayer. Pt shared concerns with living arrangements but has a plan to live with son and daughter-in-law upon discharge. When this Thereasa Parkin asked if pt was interested in AD info, she stated, "I'll think about it.". No further needs expressed at this time.

## 2020-03-04 NOTE — Progress Notes (Signed)
*  PRELIMINARY RESULTS* Echocardiogram 2D Echocardiogram has been performed.  Cristela Blue 03/04/2020, 9:40 AM

## 2020-03-04 NOTE — Care Management Important Message (Signed)
Important Message  Patient Details  Name: Elizabeth Coffey MRN: [id number] Date of Birth: [birth year]   Medicare Important Message Given:  Yes     [name] 03/04/2020, 11:30 AM

## 2020-03-04 NOTE — Progress Notes (Signed)
Ch attempted visit to do AD education. Pt has pending [place] Consult. Pt was asleep. Ch will try again or pass on to on-call chaplain.

## 2020-03-04 NOTE — Plan of Care (Signed)
  Problem: Safety: Goal: Ability to remain free from injury will improve Outcome: Progressing   Problem: Education: Goal: Knowledge of disease or condition will improve Outcome: Progressing   

## 2020-03-04 NOTE — Progress Notes (Signed)
Physical Therapy Treatment Patient Details Name: Elizabeth Coffey MRN: [id number] DOB: 06-Mar-1961 Today's Date: 03/04/2020    History of Present Illness The patient is a 59 yr old woman who carries a past medical history significant for CVA, depression, anxiety, arthritis, hypertension, peptic ulcer, cervical radiculopathy, and GERD. She lives alone in a boarding house. She presented to the hospital ED on 03/01/2020 with one day history of numbness of the left side of her body. Mild dysarthria/word finding difficulties present. Patient had uncontrolled hypertension upon arrival. Admitted for potential stroke.  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 Permissive hypertension fro 24-48 hours post stroke.    PT Comments    " I feel good today"  Repeated several times during session.  BP 131/85 at rest.  She is able to get out of bed and complete a full lap around unit with no AD with mod I/Sup.  BP upon return to room 151/88.  No c/o dizziness with activity.  Pt excited to be discharged today.   Follow Up Recommendations  Home health PT     Equipment Recommendations       Recommendations for Other Services       Precautions / Restrictions Precautions Precautions: Fall Restrictions Weight Bearing Restrictions: No    Mobility  Bed Mobility Overal bed mobility: Independent                Transfers Overall transfer level: Modified independent                  Ambulation/Gait Ambulation/Gait assistance: Supervision;Modified independent (Device/Increase time) Gait Distance (Feet): 180 Feet Assistive device: None Gait Pattern/deviations: Step-through pattern Gait velocity: decreased but steady       Stairs             Wheelchair Mobility    Modified Rankin (Stroke Patients Only)       Balance Overall balance assessment: Mild deficits observed, not formally tested Sitting-balance support: No upper extremity supported;Feet supported Sitting balance-Leahy  Scale: Normal     Standing balance support: No upper extremity supported Standing balance-Leahy Scale: Good                              Cognition Arousal/Alertness: Awake/alert Behavior During Therapy: WFL for tasks assessed/performed Overall Cognitive Status: Within Functional Limits for tasks assessed                                        Exercises      General Comments        Pertinent Vitals/Pain Pain Assessment: No/denies pain    Home Living                      Prior Function            PT Goals (current goals can now be found in the care plan section) Progress towards PT goals: Progressing toward goals    Frequency    7X/week      PT Plan Current plan remains appropriate    Co-evaluation              AM-PAC PT "6 Clicks" Mobility   Outcome Measure  Help needed turning from your back to your side while in a flat bed without using bedrails?: None Help needed moving from lying on your back  to sitting on the side of a flat bed without using bedrails?: None Help needed moving to and from a bed to a chair (including a wheelchair)?: None Help needed standing up from a chair using your arms (e.g., wheelchair or bedside chair)?: None Help needed to walk in hospital room?: None Help needed climbing 3-5 steps with a railing? : A Little 6 Click Score: 23    End of Session Equipment Utilized During Treatment: Gait belt Activity Tolerance: Patient tolerated treatment well Patient left: in bed;with bed alarm set;with call bell/phone within reach Nurse Communication: Mobility status Hemiplegia - Right/Left: Left Hemiplegia - dominant/non-dominant: Non-dominant Hemiplegia - caused by: Cerebral infarction     Time: 1100-1110 PT Time Calculation (min) (ACUTE ONLY): 10 min  Charges:  $Gait Training: 8-22 mins                    Danielle Dess, PTA 03/04/20, 12:29 PM

## 2020-03-08 ENCOUNTER — Emergency Department
Admission: EM | Admit: 2020-03-08 | Discharge: 2020-03-08 | Disposition: A | Discharge status: Home / Self Care | Payer: Medicare Other | Attending: Emergency Medicine | Admitting: Emergency Medicine

## 2020-03-08 ENCOUNTER — Other Ambulatory Visit: Payer: Self-pay

## 2020-03-08 ENCOUNTER — Encounter: Payer: Self-pay | Admitting: Emergency Medicine

## 2020-03-08 DIAGNOSIS — Z79899 Other long term (current) drug therapy: Secondary | ICD-10-CM | POA: Insufficient documentation

## 2020-03-08 DIAGNOSIS — I10 Essential (primary) hypertension: Secondary | ICD-10-CM | POA: Insufficient documentation

## 2020-03-08 LAB — BASIC METABOLIC PANEL
Anion gap: 7 (ref 5–15)
BUN: 17 mg/dL (ref 6–20)
CO2: 24 mmol/L (ref 22–32)
Calcium: 9.6 mg/dL (ref 8.9–10.3)
Chloride: 108 mmol/L (ref 98–111)
Creatinine, Ser: 0.82 mg/dL (ref 0.44–1.00)
GFR calc Af Amer: >60 mL/min (ref >60)
GFR calc non Af Amer: >60 mL/min (ref >60)
Glucose, Bld: 98 mg/dL (ref 70–99)
Potassium: 3.4 mmol/L — ABNORMAL LOW (ref 3.5–5.1)
Sodium: 139 mmol/L (ref 135–145)

## 2020-03-08 LAB — CBC
HCT: 42.9 % (ref 36.0–46.0)
Hemoglobin: 13.7 g/dL (ref 12.0–15.0)
MCH: 25.8 pg — ABNORMAL LOW (ref 26.0–34.0)
MCHC: 31.9 g/dL (ref 30.0–36.0)
MCV: 80.6 fL (ref 80.0–100.0)
Platelets: 253 10*3/uL (ref 150–400)
RBC: 5.32 MIL/uL — ABNORMAL HIGH (ref 3.87–5.11)
RDW: 13.8 % (ref 11.5–15.5)
WBC: 6.7 10*3/uL (ref 4.0–10.5)
nRBC: 0 % (ref 0.0–0.2)

## 2020-03-08 LAB — TROPONIN I (HIGH SENSITIVITY)
Troponin I (High Sensitivity): 3 ng/L (ref <18)
Troponin I (High Sensitivity): 3 ng/L (ref <18)

## 2020-03-08 MED ORDER — CLONIDINE HCL 0.1 MG PO TABS
0.1000 mg | ORAL_TABLET | ORAL | Status: AC
  Administered 2020-03-08: 0.1 mg via ORAL
  Filled 2020-03-08: qty 1

## 2020-03-08 MED ORDER — LOSARTAN POTASSIUM 50 MG PO TABS
25.0000 mg | ORAL_TABLET | ORAL | Status: AC
  Administered 2020-03-08: 25 mg via ORAL
  Filled 2020-03-08: qty 1

## 2020-03-08 MED ORDER — CARVEDILOL 6.25 MG PO TABS
12.5000 mg | ORAL_TABLET | ORAL | Status: AC
  Administered 2020-03-08: 12.5 mg via ORAL
  Filled 2020-03-08: qty 2

## 2020-03-08 NOTE — ED Triage Notes (Signed)
Pt to ED via [facility] for HTN. Per EMS they were called out because pts [facility] nurse came out and checked pts BP. Pts initial BP was 220/140. EMS reports last BP 194/112. Pt had recent CVA with left side residual weakness and numbness. Pt denies any new stroke like symptoms . Pt states that she was riding the the car with her son and daughter in law and felt like they were moving backwards but they were sitting still. Pt is A & O x 4. Pt is in NAD.

## 2020-03-08 NOTE — ED Provider Notes (Signed)
[facility] Emergency Department Provider Note ____________________________________________   First MD Initiated Contact with Patient 03/08/20 2144     (approximate)  I have reviewed the triage vital signs and the nursing notes.   HISTORY  Chief Complaint Hypertension  HPI Elizabeth Coffey is a 59 y.o. female here for evaluation of elevated blood pressure  Patient was recently hospitalized where she had a small stroke.  She reports that she was taken off many of her medications, and since then she has been dealing with concerns for high blood pressure.   She had stopped many of her medications based on her discharge summary, she went and saw Dr. Dareen Piano yesterday and was placed back on many of her medications.  She reports that home health nurse came to check on her today and found her blood pressure was quite high and referred her back to the ER.  She is not have any chest pain no trouble breathing.  No new numbness or weakness she has been having some ongoing left arm numbness which is not new since she had her stroke  She is newly on Plavix and is continued to take that.  Patient denies acute concerns other than her blood pressure was very high when nurse checked.  She does report however that she has not had her evening medications, and her medication list was confused and she had stopped several medicines she was supposed to be on until she saw Dr. Dareen Piano yesterday and got things straightened out.  She has not taken her clonidine Coreg or losartan today    Past Medical History:  Diagnosis Date  . Anxiety   . Arthritis   . Hypertension   . Peptic ulcer     Patient Active Problem List   Diagnosis Date Noted  . Lethargy 03/01/2020  . Stroke (HCC) 03/01/2020  . Numbness and tingling of left arm and leg 08/24/2018  . Sleep disturbance 08/24/2018  . Chest pain at rest 07/11/2018  . Headache disorder 06/10/2018  . Poorly-controlled hypertension  03/25/2018  . Cervical radiculopathy 01/31/2018  . Hemorrhoids 12/18/2017  . Mild carpal tunnel syndrome of right wrist 12/18/2017  . Depression with anxiety 09/27/2017  . Cervical muscle strain 09/23/2016  . Degenerative disc disease, lumbar 09/23/2016  . Essential hypertension 09/23/2016  . Gastroesophageal reflux disease without esophagitis 09/23/2016  . Abnormal mammogram of left breast 01/06/2016    Past Surgical History:  Procedure Laterality Date  . ABDOMINAL HYSTERECTOMY    . BREAST CYST EXCISION      Prior to Admission medications   Medication Sig Start Date End Date Taking? Authorizing Provider  albuterol-ipratropium (COMBIVENT) 18-103 MCG/ACT inhaler Inhale 1 puff into the lungs every 4 (four) hours as needed for wheezing or shortness of breath. 09/29/15   Beers, Charmayne Sheer, PA-C  amLODipine-benazepril (LOTREL) 10-20 MG capsule Take 1 capsule by mouth daily. 08/13/16   Jeanmarie Plant, MD  aspirin EC 81 MG tablet Take 1 tablet (81 mg total) by mouth daily. 03/04/20   Swayze, Ava, DO  clopidogrel (PLAVIX) 75 MG tablet Take 1 tablet (75 mg total) by mouth daily. 03/04/20 03/04/21  Swayze, Ava, DO  DULoxetine (CYMBALTA) 30 MG capsule TAKE 1 CAPSULE (30 MG TOTAL) BY MOUTH DAILY. FOR MOOD 11/18/18   Jomarie Longs, MD  linaclotide (LINZESS) 290 MCG CAPS capsule Take by mouth.    [provider]  nortriptyline (PAMELOR) 10 MG capsule Take 2 capsules (20 mg total) by mouth at bedtime. 10/26/18  Jomarie Longs, MD  omeprazole (PRILOSEC) 40 MG capsule Take 40 mg by mouth daily.    [provider]  QUEtiapine (SEROQUEL) 25 MG tablet TAKE 1 TABLET (25 MG TOTAL) BY MOUTH AT BEDTIME. FOR MOOD AND SLEEP 11/18/18   Jomarie Longs, MD  simvastatin (ZOCOR) 20 MG tablet Take 1 tablet (20 mg total) by mouth every evening. 03/04/20 03/04/21  Swayze, Ava, DO  traMADol (ULTRAM) 50 MG tablet Take 1 tablet (50 mg total) by mouth every 6 (six) hours as needed for moderate pain (headache).  03/04/20   Swayze, Ava, DO    Allergies Hydrocodone-acetaminophen, Aspirin, Mobic [meloxicam], Tramadol, Chocolate, and Cocoa  Family History  Problem Relation Age of Onset  . Mental illness Maternal Uncle     Social History Social History   Tobacco Use  . Smoking status: Never Smoker  . Smokeless tobacco: Never Used  Substance Use Topics  . Alcohol use: No  . Drug use: No    Review of Systems Constitutional: No fever/chills Eyes: No visual changes. ENT: No sore throat. Cardiovascular: Denies chest pain. Respiratory: Denies shortness of breath. Gastrointestinal: No abdominal pain.   Musculoskeletal: No new weakness, having some ongoing left arm numbness. No weakness. Skin: Negative for rash. Neurological: Negative for headaches, areas of focal weakness except left arm numbness   ____________________________________________   PHYSICAL EXAM:  VITAL SIGNS: ED Triage Vitals [03/08/20 1707]  Enc Vitals Group     BP (!) 173/103     Pulse Rate 93     Resp 16     Temp 98.5 F (36.9 C)     Temp Source Oral     SpO2 98 %     Weight 206 lb (93.4 kg)     Height 5\' 4"  (1.626 m)     Head Circumference      Peak Flow      Pain Score 4     Pain Loc      Pain Edu?      Excl. in GC?     Constitutional: Alert and oriented. Well appearing and in no acute distress. Eyes: Conjunctivae are normal. Head: Atraumatic. Nose: No congestion/rhinnorhea. Mouth/Throat: Mucous membranes are moist. Neck: No stridor.  Cardiovascular: Normal rate, regular rhythm. Grossly normal heart sounds.  Good peripheral circulation. Respiratory: Normal respiratory effort.  No retractions. Lungs CTAB. Gastrointestinal: Soft and nontender. No distention. Musculoskeletal: No lower extremity tenderness nor edema. Neurologic:  Normal speech and language. No gross focal neurologic deficits are appreciated.  Moves all extremities well.  Normal cranial nerve exam.  Clear speech. Skin:  Skin is warm, dry  and intact. No rash noted. Psychiatric: Mood and affect are normal. Speech and behavior are normal.  ____________________________________________   LABS (all labs ordered are listed, but only abnormal results are displayed)  Labs Reviewed  CBC - Abnormal; Notable for the following components:      Result Value   RBC 5.32 (*)    MCH 25.8 (*)    All other components within normal limits  BASIC METABOLIC PANEL - Abnormal; Notable for the following components:   Potassium 3.4 (*)    All other components within normal limits  TROPONIN I (HIGH SENSITIVITY)  TROPONIN I (HIGH SENSITIVITY)   ____________________________________________  EKG  Reviewed entered by me at 1720 Heart rate 99 QRS 70 QTc 440 Normal sinus rhythm, minimal nonspecific T wave abnormality.  No evidence acute ischemia ____________________________________________  RADIOLOGY   ____________________________________________   PROCEDURES  Procedure(s) performed: None  Procedures  Critical Care performed: No  ____________________________________________   INITIAL IMPRESSION / ASSESSMENT AND PLAN / ED COURSE  Pertinent labs & imaging results that were available during my care of the patient were reviewed by me and considered in my medical decision making (see chart for details).   Patient presents for concerns of hypertension, I suspect this is related to medication reconciliation issues.  She had originally thought she was to stop the majority of her medications, but this has since been reconciled by her primary.  She is hypertensive on arrival here but also due for some of her evening medications.  Denies any headache no no acute neurologic symptoms.  No chest pain or shortness of breath.  Very reassuring examination and laboratory testing  Clinical Course as of Mar 09 2331  Fri Mar 08, 2020  2300 Blood pressure 146/94, improved.  Patient resting comfortably no distress.  She will continue her medications  as reconciled by Dr. Ouida Sills.  Following up with primary care  Return precautions and treatment recommendations and follow-up discussed with the patient who is agreeable with the plan.    [MQ]    Clinical Course User Index [MQ] Delman Kitten, MD   Return precautions and treatment recommendations and follow-up discussed with the patient who is agreeable with the plan.   ____________________________________________   FINAL CLINICAL IMPRESSION(S) / ED DIAGNOSES  Final diagnoses:  Hypertension, unspecified type        Note:  This document was prepared using Dragon voice recognition software and may include unintentional dictation errors       Delman Kitten, MD 03/08/20 2332

## 2020-03-08 NOTE — ED Notes (Signed)
Graham crackers and po fluids provided.

## 2020-03-16 DIAGNOSIS — I639 Cerebral infarction, unspecified: Secondary | ICD-10-CM

## 2020-03-16 HISTORY — DX: Cerebral infarction, unspecified: I63.9

## 2020-05-01 ENCOUNTER — Other Ambulatory Visit: Payer: Self-pay

## 2020-05-01 ENCOUNTER — Encounter: Payer: Self-pay | Admitting: Emergency Medicine

## 2020-05-01 ENCOUNTER — Emergency Department
Admission: EM | Admit: 2020-05-01 | Discharge: 2020-05-01 | Disposition: A | Discharge status: Home / Self Care | Payer: Medicare Other | Attending: Emergency Medicine | Admitting: Emergency Medicine

## 2020-05-01 ENCOUNTER — Emergency Department: Payer: Medicare Other

## 2020-05-01 DIAGNOSIS — I159 Secondary hypertension, unspecified: Secondary | ICD-10-CM | POA: Diagnosis not present

## 2020-05-01 DIAGNOSIS — Z7902 Long term (current) use of antithrombotics/antiplatelets: Secondary | ICD-10-CM | POA: Diagnosis not present

## 2020-05-01 DIAGNOSIS — Z8673 Personal history of transient ischemic attack (TIA), and cerebral infarction without residual deficits: Secondary | ICD-10-CM | POA: Insufficient documentation

## 2020-05-01 DIAGNOSIS — R519 Headache, unspecified: Secondary | ICD-10-CM | POA: Insufficient documentation

## 2020-05-01 DIAGNOSIS — R030 Elevated blood-pressure reading, without diagnosis of hypertension: Secondary | ICD-10-CM | POA: Diagnosis present

## 2020-05-01 DIAGNOSIS — I10 Essential (primary) hypertension: Secondary | ICD-10-CM | POA: Insufficient documentation

## 2020-05-01 DIAGNOSIS — Z7982 Long term (current) use of aspirin: Secondary | ICD-10-CM | POA: Insufficient documentation

## 2020-05-01 DIAGNOSIS — Z79899 Other long term (current) drug therapy: Secondary | ICD-10-CM | POA: Diagnosis not present

## 2020-05-01 LAB — BASIC METABOLIC PANEL
Anion gap: 8 (ref 5–15)
BUN: 18 mg/dL (ref 6–20)
CO2: 28 mmol/L (ref 22–32)
Calcium: 9.5 mg/dL (ref 8.9–10.3)
Chloride: 105 mmol/L (ref 98–111)
Creatinine, Ser: 0.85 mg/dL (ref 0.44–1.00)
GFR calc Af Amer: >60 mL/min (ref >60)
GFR calc non Af Amer: >60 mL/min (ref >60)
Glucose, Bld: 110 mg/dL — ABNORMAL HIGH (ref 70–99)
Potassium: 3.2 mmol/L — ABNORMAL LOW (ref 3.5–5.1)
Sodium: 141 mmol/L (ref 135–145)

## 2020-05-01 LAB — CBC
HCT: 38 % (ref 36.0–46.0)
Hemoglobin: 12.5 g/dL (ref 12.0–15.0)
MCH: 25.9 pg — ABNORMAL LOW (ref 26.0–34.0)
MCHC: 32.9 g/dL (ref 30.0–36.0)
MCV: 78.8 fL — ABNORMAL LOW (ref 80.0–100.0)
Platelets: 255 10*3/uL (ref 150–400)
RBC: 4.82 MIL/uL (ref 3.87–5.11)
RDW: 15 % (ref 11.5–15.5)
WBC: 6.8 10*3/uL (ref 4.0–10.5)
nRBC: 0 % (ref 0.0–0.2)

## 2020-05-01 LAB — PROTIME-INR
INR: 0.9 (ref 0.8–1.2)
Prothrombin Time: 12.2 seconds (ref 11.4–15.2)

## 2020-05-01 MED ORDER — POTASSIUM CHLORIDE CRYS ER 20 MEQ PO TBCR
40.0000 meq | EXTENDED_RELEASE_TABLET | Freq: Once | ORAL | Status: AC
  Administered 2020-05-01: 40 meq via ORAL
  Filled 2020-05-01: qty 2

## 2020-05-01 MED ORDER — ACETAMINOPHEN 500 MG PO TABS
1000.0000 mg | ORAL_TABLET | Freq: Once | ORAL | Status: AC
  Administered 2020-05-01: 1000 mg via ORAL
  Filled 2020-05-01: qty 2

## 2020-05-01 NOTE — Discharge Instructions (Addendum)
At this point in time I do not want to make any changes to your blood pressure medicine given your blood pressure is 155/85.  If it is remaining significantly elevated before you get follow-up with your primary care doctor you could take the clonidine 3 times a day instead of twice a day however I would prefer that you call your primary doctor tomorrow and get a follow-up check on Friday.  If it still elevated on Friday they will most likely adjust your blood pressure medicine.  Return to the ER for worsening headaches, chest pain, shortness of breath or any other concerns    1. No evidence of acute intracranial abnormality.  2. Redemonstrated small chronic right thalamic lacunar infarct.  3. Known mild chronic small vessel ischemic changes within the  cerebral white matter were better appreciated on MRI 03/01/2020.

## 2020-05-01 NOTE — ED Triage Notes (Addendum)
Pt from home . Per pt, she was sent by home health RN to ED for evaluation due to hypertension BP at home 201/98.  Pt c/o HA pain 2/10 last night at 11pm. BI grip strength. Denies blurry vision. Pt speaking in complete and clear sentences  St taking her BP med this am as prescribed.   Pt st hx of stroke in 02/2020, admitted to [facility]- sent homewith Rx for Plavix. Marland Kitchen  NAD noted at this time.

## 2020-05-01 NOTE — ED Provider Notes (Signed)
[facility] Emergency Department Provider Note  ____________________________________________   First MD Initiated Contact with Patient 05/01/20 1719     (approximate)  I have reviewed the triage vital signs and the nursing notes.   HISTORY  Chief Complaint Hypertension    HPI Elizabeth Coffey is a 59 y.o. female with hypertension, anxiety, arthritis who comes in for high blood pressure.  Blood pressure was 201/98 when she woke up this morning.  She took her home medicine and then the blood pressure was rechecked and it was still elevated so they wanted her to come into the ER to be evaluated.  To note patient had recent admission back in March 2021 for a stroke.  Patient is currently on clodine 0.2  Bid carvedilol 25 losartan hctz 100-12.5.  Patient states that she has a little bit of a headache on the left side of her head but thinks it might just be because she is hungry.  Is mild, constant, nothing makes better, nothing makes it worse.  She denies any new stroke symptoms.  She has a little bit of left-sided weakness from her prior stroke but is been getting a lot better.          Past Medical History:  Diagnosis Date  . Anxiety   . Arthritis   . Hypertension   . Peptic ulcer     Patient Active Problem List   Diagnosis Date Noted  . Lethargy 03/01/2020  . Stroke (HCC) 03/01/2020  . Numbness and tingling of left arm and leg 08/24/2018  . Sleep disturbance 08/24/2018  . Chest pain at rest 07/11/2018  . Headache disorder 06/10/2018  . Poorly-controlled hypertension 03/25/2018  . Cervical radiculopathy 01/31/2018  . Hemorrhoids 12/18/2017  . Mild carpal tunnel syndrome of right wrist 12/18/2017  . Depression with anxiety 09/27/2017  . Cervical muscle strain 09/23/2016  . Degenerative disc disease, lumbar 09/23/2016  . Essential hypertension 09/23/2016  . Gastroesophageal reflux disease without esophagitis 09/23/2016  . Abnormal mammogram of left  breast 01/06/2016    Past Surgical History:  Procedure Laterality Date  . ABDOMINAL HYSTERECTOMY    . BREAST CYST EXCISION      Prior to Admission medications   Medication Sig Start Date End Date Taking? Authorizing Provider  albuterol-ipratropium (COMBIVENT) 18-103 MCG/ACT inhaler Inhale 1 puff into the lungs every 4 (four) hours as needed for wheezing or shortness of breath. 09/29/15   Beers, Charmayne Sheer, PA-C  amLODipine-benazepril (LOTREL) 10-20 MG capsule Take 1 capsule by mouth daily. 08/13/16   Jeanmarie Plant, MD  aspirin EC 81 MG tablet Take 1 tablet (81 mg total) by mouth daily. 03/04/20   Swayze, Ava, DO  clopidogrel (PLAVIX) 75 MG tablet Take 1 tablet (75 mg total) by mouth daily. 03/04/20 03/04/21  Swayze, Ava, DO  DULoxetine (CYMBALTA) 30 MG capsule TAKE 1 CAPSULE (30 MG TOTAL) BY MOUTH DAILY. FOR MOOD 11/18/18   Jomarie Longs, MD  linaclotide (LINZESS) 290 MCG CAPS capsule Take by mouth.    [provider]  nortriptyline (PAMELOR) 10 MG capsule Take 2 capsules (20 mg total) by mouth at bedtime. 10/26/18   Jomarie Longs, MD  omeprazole (PRILOSEC) 40 MG capsule Take 40 mg by mouth daily.    [provider]  QUEtiapine (SEROQUEL) 25 MG tablet TAKE 1 TABLET (25 MG TOTAL) BY MOUTH AT BEDTIME. FOR MOOD AND SLEEP 11/18/18   Jomarie Longs, MD  simvastatin (ZOCOR) 20 MG tablet Take 1 tablet (20 mg total)  by mouth every evening. 03/04/20 03/04/21  Swayze, Ava, DO  traMADol (ULTRAM) 50 MG tablet Take 1 tablet (50 mg total) by mouth every 6 (six) hours as needed for moderate pain (headache). 03/04/20   Swayze, Ava, DO    Allergies Hydrocodone-acetaminophen, Aspirin, Mobic [meloxicam], Tramadol, Chocolate, and Cocoa  Family History  Problem Relation Age of Onset  . Mental illness Maternal Uncle     Social History Social History   Tobacco Use  . Smoking status: Never Smoker  . Smokeless tobacco: Never Used  Substance Use Topics  . Alcohol use: No  . Drug use: No       Review of Systems Constitutional: No fever/chills Eyes: No visual changes. ENT: No sore throat. Cardiovascular: Denies chest pain. Respiratory: Denies shortness of breath. Gastrointestinal: No abdominal pain.  No nausea, no vomiting.  No diarrhea.  No constipation. Genitourinary: Negative for dysuria. Musculoskeletal: Negative for back pain. Skin: Negative for rash. Neurological: Positive headache, no, focal weakness or numbness. All other ROS negative ____________________________________________   PHYSICAL EXAM:  VITAL SIGNS: ED Triage Vitals  Enc Vitals Group     BP 05/01/20 1440 (!) 166/98     Pulse Rate 05/01/20 1440 77     Resp 05/01/20 1440 18     Temp 05/01/20 1440 98.2 F (36.8 C)     Temp Source 05/01/20 1440 Oral     SpO2 05/01/20 1440 98 %     Weight 05/01/20 1440 214 lb (97.1 kg)     Height 05/01/20 1440 5\' 3"  (1.6 m)     Head Circumference --      Peak Flow --      Pain Score 05/01/20 1449 0     Pain Loc --      Pain Edu? --      Excl. in [place]? --     Constitutional: Alert and oriented. Well appearing and in no acute distress. Eyes: Conjunctivae are normal. EOMI. Head: Atraumatic. Nose: No congestion/rhinnorhea. Mouth/Throat: Mucous membranes are moist.   Neck: No stridor. Trachea Midline. FROM Cardiovascular: Normal rate, regular rhythm. Grossly normal heart sounds.  Good peripheral circulation. Respiratory: Normal respiratory effort.  No retractions. Lungs CTAB. Gastrointestinal: Soft and nontender. No distention. No abdominal bruits.  Musculoskeletal: No lower extremity tenderness nor edema.  No joint effusions. Neurologic:  Normal speech and language.  Patient reports that her neurologic exam is at her baseline self, and cranial nerves II through XII are intact.  Very mild weakness on the left at baseline Skin:  Skin is warm, dry and intact. No rash noted. Psychiatric: Mood and affect are normal. Speech and behavior are normal. GU: Deferred    ____________________________________________   LABS (all labs ordered are listed, but only abnormal results are displayed)  Labs Reviewed  BASIC METABOLIC PANEL - Abnormal; Notable for the following components:      Result Value   Potassium 3.2 (*)    Glucose, Bld 110 (*)    All other components within normal limits  CBC - Abnormal; Notable for the following components:   MCV 78.8 (*)    MCH 25.9 (*)    All other components within normal limits  PROTIME-INR   ____________________________________________   RADIOLOGY   Official radiology report(s): CT Head Wo Contrast  Result Date: 05/01/2020 CLINICAL DATA:  Headache, acute, normal neuro exam. Additional history provided: Patient sent by home health nurse to ED for evaluation due to hypertension and headache. EXAM: CT HEAD WITHOUT CONTRAST TECHNIQUE: Contiguous axial images  were obtained from the base of the skull through the vertex without intravenous contrast. COMPARISON:  CT angiogram head/neck 03/02/2020, brain MRI 03/01/2020 FINDINGS: Brain: Redemonstrated now chronic lacunar infarct within the right thalamus. Known mild chronic small vessel ischemic disease within the cerebral white matter was better appreciated on prior MRI 03/01/2020. Cerebral volume is normal. There is no acute intracranial hemorrhage. No demarcated cortical infarct. No extra-axial fluid collection. No evidence of intracranial mass. No midline shift. Partially empty sella turcica. Vascular: No hyperdense vessel. Skull: Normal. Negative for fracture or focal lesion. Sinuses/Orbits: Visualized orbits show no acute finding. No significant paranasal sinus disease or mastoid effusion at the imaged levels. IMPRESSION: 1. No evidence of acute intracranial abnormality. 2. Redemonstrated small chronic right thalamic lacunar infarct. 3. Known mild chronic small vessel ischemic changes within the cerebral white matter were better appreciated on MRI 03/01/2020. Electronically  Signed   By: Jackey Loge DO   On: 05/01/2020 18:07    ____________________________________________   PROCEDURES  Procedure(s) performed (including Critical Care):  Procedures   ____________________________________________   INITIAL IMPRESSION / ASSESSMENT AND PLAN / ED COURSE  KRISTENE LIBERATI was evaluated in Emergency Department on 05/01/2020 for the symptoms described in the history of present illness. She was evaluated in the context of the global COVID-19 pandemic, which necessitated consideration that the patient might be at risk for infection with the SARS-CoV-2 virus that causes COVID-19. Institutional protocols and algorithms that pertain to the evaluation of patients at risk for COVID-19 are in a state of rapid change based on information released by regulatory bodies including the CDC and federal and state organizations. These policies and algorithms were followed during the patient's care in the ED.    Patient is a very well-appearing 59 year old with normal neurological exam at her baseline self who comes in for high blood pressures.  Patient denies any other new symptoms except for very mild headache.  We discussed doing a CT head just to make sure there is no signs of intracranial hemorrhage or conversion of her prior stroke.  She is okay with proceeding with CT head.  Will get labs to make sure no signs electrolyte abnormalities, AKI.  She denies any chest pain or shortness of breath to suggest ACS, PE  CT head is stable  Patient feeling better with the Tylenol.  Patient feels comfortable discharge home.  We reviewed patient's blood pressure medicine.  We discussed that sometimes having to many people adjusting the medicines can be dangerous.  I did suggest that she follow-up with her primary care doctor and have them adjusted.  She should follow-up by Friday to see if still elevated.  If not able to see them she could increase the clonidine to 3 times a day.  However repeat blood  pressure here is 155/85.  Patient expressed understanding and will call her doctor tomorrow  I discussed the provisional nature of ED diagnosis, the treatment so far, the ongoing plan of care, follow up appointments and return precautions with the patient and any family or support people present. They expressed understanding and agreed with the plan, discharged home.     ____________________________________________   FINAL CLINICAL IMPRESSION(S) / ED DIAGNOSES   Final diagnoses:  Secondary hypertension      MEDICATIONS GIVEN DURING THIS VISIT:  Medications  potassium chloride SA (KLOR-CON) CR tablet 40 mEq (has no administration in time range)  acetaminophen (TYLENOL) tablet 1,000 mg (1,000 mg Oral Given 05/01/20 1807)     ED  Discharge Orders    None       Note:  This document was prepared using Dragon voice recognition software and may include unintentional dictation errors.   Concha Se, MD 05/01/20 [phone]

## 2020-05-07 ENCOUNTER — Ambulatory Visit
Admission: RE | Admit: 2020-05-07 | Discharge: 2020-05-07 | Disposition: A | Discharge status: Home / Self Care | Payer: Medicare Other | Source: Ambulatory Visit | Attending: Internal Medicine | Admitting: Internal Medicine

## 2020-05-07 DIAGNOSIS — Z1231 Encounter for screening mammogram for malignant neoplasm of breast: Secondary | ICD-10-CM | POA: Diagnosis present

## 2020-05-13 ENCOUNTER — Inpatient Hospital Stay
Admission: RE | Admit: 2020-05-13 | Discharge: 2020-05-13 | Disposition: A | Discharge status: Home / Self Care | Payer: Self-pay | Source: Ambulatory Visit | Attending: *Deleted | Admitting: *Deleted

## 2020-05-13 ENCOUNTER — Other Ambulatory Visit: Payer: Self-pay | Admitting: *Deleted

## 2020-05-13 DIAGNOSIS — Z1231 Encounter for screening mammogram for malignant neoplasm of breast: Secondary | ICD-10-CM

## 2020-05-22 ENCOUNTER — Ambulatory Visit: Payer: Medicare Other | Attending: Critical Care Medicine

## 2020-05-22 DIAGNOSIS — Z23 Encounter for immunization: Secondary | ICD-10-CM

## 2020-05-22 NOTE — Progress Notes (Signed)
   Covid-19 Vaccination Clinic  Name:  Elizabeth Coffey    MRN: [id number] DOB: 1961-08-15  05/22/2020  [name] was observed post Covid-19 immunization for 15 minutes without incident. She was provided with Vaccine Information Sheet and instruction to access the V-Safe system.   [name] was instructed to call 911 with any severe reactions post vaccine: Marland Kitchen Difficulty breathing  . Swelling of face and throat  . A fast heartbeat  . A bad rash all over body  . Dizziness and weakness   Immunizations Administered    Name Date Dose VIS Date Route   Moderna COVID-19 Vaccine 05/22/2020 10:56 AM 0.5 mL 11/2019 Intramuscular   Manufacturer: Moderna   Lot: [id number]   NDC: 87276-184-85

## 2020-06-19 ENCOUNTER — Ambulatory Visit: Payer: Medicare Other | Attending: Critical Care Medicine

## 2020-06-19 DIAGNOSIS — Z23 Encounter for immunization: Secondary | ICD-10-CM

## 2020-06-19 NOTE — Progress Notes (Signed)
   Covid-19 Vaccination Clinic  Name:  Elizabeth Coffey    MRN: [id number] DOB: 25-Aug-1961  06/19/2020  Elizabeth Coffey was observed post Covid-19 immunization for 15 minutes without incident. She was provided with Vaccine Information Sheet and instruction to access the V-Safe system.   Elizabeth Coffey was instructed to call 911 with any severe reactions post vaccine: Marland Kitchen Difficulty breathing  . Swelling of face and throat  . A fast heartbeat  . A bad rash all over body  . Dizziness and weakness   Immunizations Administered    Name Date Dose VIS Date Route   Moderna COVID-19 Vaccine 06/19/2020 10:19 AM 0.5 mL 11/2019 Intramuscular   Manufacturer: Moderna   Lot: [id number]   NDC: 85027-741-28

## 2021-03-28 ENCOUNTER — Other Ambulatory Visit: Payer: Self-pay | Admitting: Internal Medicine

## 2021-04-01 ENCOUNTER — Other Ambulatory Visit: Payer: Self-pay | Admitting: Internal Medicine

## 2021-04-01 DIAGNOSIS — Z1231 Encounter for screening mammogram for malignant neoplasm of breast: Secondary | ICD-10-CM

## 2021-07-15 ENCOUNTER — Other Ambulatory Visit: Payer: Self-pay | Admitting: Physician Assistant

## 2021-07-15 DIAGNOSIS — M542 Cervicalgia: Secondary | ICD-10-CM

## 2021-07-15 DIAGNOSIS — Z8673 Personal history of transient ischemic attack (TIA), and cerebral infarction without residual deficits: Secondary | ICD-10-CM

## 2021-07-31 ENCOUNTER — Ambulatory Visit
Admission: RE | Admit: 2021-07-31 | Discharge: 2021-07-31 | Disposition: A | Discharge status: Home / Self Care | Payer: Medicare Other | Source: Ambulatory Visit | Attending: Physician Assistant | Admitting: Physician Assistant

## 2021-07-31 ENCOUNTER — Other Ambulatory Visit: Payer: Self-pay

## 2021-07-31 DIAGNOSIS — M542 Cervicalgia: Secondary | ICD-10-CM | POA: Diagnosis not present

## 2021-07-31 DIAGNOSIS — Z8673 Personal history of transient ischemic attack (TIA), and cerebral infarction without residual deficits: Secondary | ICD-10-CM | POA: Diagnosis present

## 2021-09-10 ENCOUNTER — Other Ambulatory Visit (HOSPITAL_COMMUNITY): Payer: Self-pay | Admitting: Internal Medicine

## 2021-09-10 ENCOUNTER — Other Ambulatory Visit: Payer: Self-pay | Admitting: Internal Medicine

## 2021-09-10 DIAGNOSIS — N1831 Chronic kidney disease, stage 3a: Secondary | ICD-10-CM

## 2021-09-10 DIAGNOSIS — N1832 Chronic kidney disease, stage 3b: Secondary | ICD-10-CM

## 2021-09-19 ENCOUNTER — Ambulatory Visit
Admission: RE | Admit: 2021-09-19 | Discharge: 2021-09-19 | Disposition: A | Discharge status: Home / Self Care | Payer: Medicare Other | Source: Ambulatory Visit | Attending: Internal Medicine | Admitting: Internal Medicine

## 2021-09-19 DIAGNOSIS — N1831 Chronic kidney disease, stage 3a: Secondary | ICD-10-CM | POA: Diagnosis present

## 2021-09-19 DIAGNOSIS — E1122 Type 2 diabetes mellitus with diabetic chronic kidney disease: Secondary | ICD-10-CM | POA: Diagnosis present

## 2021-09-19 DIAGNOSIS — N1832 Chronic kidney disease, stage 3b: Secondary | ICD-10-CM | POA: Insufficient documentation

## 2021-10-14 ENCOUNTER — Inpatient Hospital Stay: Admission: RE | Admit: 2021-10-14 | Payer: Medicare Other | Source: Ambulatory Visit

## 2021-10-16 ENCOUNTER — Ambulatory Visit
Admission: RE | Admit: 2021-10-16 | Discharge: 2021-10-16 | Disposition: A | Discharge status: Home / Self Care | Payer: Medicare Other | Source: Ambulatory Visit | Attending: Internal Medicine | Admitting: Internal Medicine

## 2021-10-16 ENCOUNTER — Other Ambulatory Visit: Payer: Self-pay

## 2021-10-16 DIAGNOSIS — Z1231 Encounter for screening mammogram for malignant neoplasm of breast: Secondary | ICD-10-CM | POA: Insufficient documentation

## 2021-11-25 ENCOUNTER — Ambulatory Visit: Payer: Medicare Other | Attending: Otolaryngology

## 2021-11-25 DIAGNOSIS — G4710 Hypersomnia, unspecified: Secondary | ICD-10-CM | POA: Diagnosis present

## 2021-11-25 DIAGNOSIS — R0683 Snoring: Secondary | ICD-10-CM | POA: Insufficient documentation

## 2021-11-25 DIAGNOSIS — R519 Headache, unspecified: Secondary | ICD-10-CM | POA: Diagnosis present

## 2021-11-25 DIAGNOSIS — R413 Other amnesia: Secondary | ICD-10-CM | POA: Diagnosis present

## 2021-11-26 ENCOUNTER — Other Ambulatory Visit: Payer: Self-pay

## 2022-04-30 ENCOUNTER — Ambulatory Visit: Admission: RE | Admit: 2022-04-30 | Payer: Medicare Other | Source: Home / Self Care | Admitting: Gastroenterology

## 2022-04-30 ENCOUNTER — Encounter: Admission: RE | Payer: Self-pay | Source: Home / Self Care

## 2022-04-30 SURGERY — COLONOSCOPY WITH PROPOFOL
Anesthesia: General

## 2022-06-08 ENCOUNTER — Other Ambulatory Visit: Payer: Self-pay | Admitting: Physician Assistant

## 2022-06-08 DIAGNOSIS — K859 Acute pancreatitis without necrosis or infection, unspecified: Secondary | ICD-10-CM

## 2022-06-11 ENCOUNTER — Ambulatory Visit
Admission: RE | Admit: 2022-06-11 | Discharge: 2022-06-11 | Disposition: A | Discharge status: Home / Self Care | Payer: Medicare Other | Source: Ambulatory Visit | Attending: Physician Assistant | Admitting: Physician Assistant

## 2022-06-11 DIAGNOSIS — K859 Acute pancreatitis without necrosis or infection, unspecified: Secondary | ICD-10-CM | POA: Diagnosis present

## 2022-06-11 MED ORDER — IOHEXOL 300 MG/ML  SOLN
100.0000 mL | Freq: Once | INTRAMUSCULAR | Status: AC | PRN
  Administered 2022-06-11: 100 mL via INTRAVENOUS

## 2022-08-01 ENCOUNTER — Emergency Department: Payer: Medicare Other

## 2022-08-01 ENCOUNTER — Other Ambulatory Visit: Payer: Self-pay

## 2022-08-01 ENCOUNTER — Encounter: Payer: Self-pay | Admitting: Emergency Medicine

## 2022-08-01 ENCOUNTER — Emergency Department
Admission: EM | Admit: 2022-08-01 | Discharge: 2022-08-01 | Disposition: A | Discharge status: Home / Self Care | Payer: Medicare Other | Attending: Student in an Organized Health Care Education/Training Program | Admitting: Student in an Organized Health Care Education/Training Program

## 2022-08-01 DIAGNOSIS — K219 Gastro-esophageal reflux disease without esophagitis: Secondary | ICD-10-CM | POA: Diagnosis not present

## 2022-08-01 DIAGNOSIS — R2990 Unspecified symptoms and signs involving the nervous system: Secondary | ICD-10-CM

## 2022-08-01 DIAGNOSIS — E785 Hyperlipidemia, unspecified: Secondary | ICD-10-CM | POA: Insufficient documentation

## 2022-08-01 DIAGNOSIS — I6782 Cerebral ischemia: Secondary | ICD-10-CM | POA: Diagnosis not present

## 2022-08-01 DIAGNOSIS — R42 Dizziness and giddiness: Secondary | ICD-10-CM | POA: Insufficient documentation

## 2022-08-01 DIAGNOSIS — Z8616 Personal history of COVID-19: Secondary | ICD-10-CM | POA: Insufficient documentation

## 2022-08-01 DIAGNOSIS — H538 Other visual disturbances: Secondary | ICD-10-CM | POA: Diagnosis not present

## 2022-08-01 DIAGNOSIS — Z8673 Personal history of transient ischemic attack (TIA), and cerebral infarction without residual deficits: Secondary | ICD-10-CM | POA: Insufficient documentation

## 2022-08-01 DIAGNOSIS — J40 Bronchitis, not specified as acute or chronic: Secondary | ICD-10-CM | POA: Diagnosis not present

## 2022-08-01 DIAGNOSIS — R053 Chronic cough: Secondary | ICD-10-CM | POA: Diagnosis present

## 2022-08-01 DIAGNOSIS — I10 Essential (primary) hypertension: Secondary | ICD-10-CM | POA: Diagnosis not present

## 2022-08-01 DIAGNOSIS — R051 Acute cough: Secondary | ICD-10-CM

## 2022-08-01 DIAGNOSIS — Z20822 Contact with and (suspected) exposure to covid-19: Secondary | ICD-10-CM | POA: Insufficient documentation

## 2022-08-01 LAB — COMPREHENSIVE METABOLIC PANEL
ALT: 16 U/L (ref 0–44)
AST: 19 U/L (ref 15–41)
Albumin: 3.5 g/dL (ref 3.5–5.0)
Alkaline Phosphatase: 83 U/L (ref 38–126)
Anion gap: 10 (ref 5–15)
BUN: 20 mg/dL (ref 8–23)
CO2: 24 mmol/L (ref 22–32)
Calcium: 9.8 mg/dL (ref 8.9–10.3)
Chloride: 109 mmol/L (ref 98–111)
Creatinine, Ser: 0.83 mg/dL (ref 0.44–1.00)
GFR, Estimated: >60 mL/min (ref >60)
Glucose, Bld: 109 mg/dL — ABNORMAL HIGH (ref 70–99)
Potassium: 3.7 mmol/L (ref 3.5–5.1)
Sodium: 143 mmol/L (ref 135–145)
Total Bilirubin: 0.6 mg/dL (ref 0.3–1.2)
Total Protein: 7.3 g/dL (ref 6.5–8.1)

## 2022-08-01 LAB — CBC
HCT: 39.4 % (ref 36.0–46.0)
Hemoglobin: 12.7 g/dL (ref 12.0–15.0)
MCH: 26.5 pg (ref 26.0–34.0)
MCHC: 32.2 g/dL (ref 30.0–36.0)
MCV: 82.1 fL (ref 80.0–100.0)
Platelets: 322 10*3/uL (ref 150–400)
RBC: 4.8 MIL/uL (ref 3.87–5.11)
RDW: 15.4 % (ref 11.5–15.5)
WBC: 6.2 10*3/uL (ref 4.0–10.5)
nRBC: 0 % (ref 0.0–0.2)

## 2022-08-01 LAB — PROTIME-INR
INR: 1 (ref 0.8–1.2)
Prothrombin Time: 12.6 seconds (ref 11.4–15.2)

## 2022-08-01 LAB — DIFFERENTIAL
Abs Immature Granulocytes: 0.02 10*3/uL (ref 0.00–0.07)
Basophils Absolute: 0 10*3/uL (ref 0.0–0.1)
Basophils Relative: 1 %
Eosinophils Absolute: 0.3 10*3/uL (ref 0.0–0.5)
Eosinophils Relative: 5 %
Immature Granulocytes: 0 %
Lymphocytes Relative: 28 %
Lymphs Abs: 1.7 10*3/uL (ref 0.7–4.0)
Monocytes Absolute: 0.4 10*3/uL (ref 0.1–1.0)
Monocytes Relative: 6 %
Neutro Abs: 3.7 10*3/uL (ref 1.7–7.7)
Neutrophils Relative %: 60 %

## 2022-08-01 LAB — SARS CORONAVIRUS 2 BY RT PCR: SARS Coronavirus 2 by RT PCR: NEGATIVE

## 2022-08-01 LAB — CBG MONITORING, ED: Glucose-Capillary: 158 mg/dL — ABNORMAL HIGH (ref 70–99)

## 2022-08-01 LAB — APTT: aPTT: 30 seconds (ref 24–36)

## 2022-08-01 LAB — TROPONIN I (HIGH SENSITIVITY): Troponin I (High Sensitivity): 7 ng/L (ref <18)

## 2022-08-01 LAB — ETHANOL: Alcohol, Ethyl (B): <10 mg/dL (ref <10)

## 2022-08-01 MED ORDER — SODIUM CHLORIDE 0.9 % IV BOLUS
500.0000 mL | Freq: Once | INTRAVENOUS | Status: AC
  Administered 2022-08-01: 500 mL via INTRAVENOUS

## 2022-08-01 MED ORDER — AZITHROMYCIN 250 MG PO TABS: ORAL_TABLET | ORAL | 0 refills | Status: AC

## 2022-08-01 MED ORDER — SODIUM CHLORIDE 0.9% FLUSH: 3.0000 mL | Freq: Once | INTRAVENOUS | Status: DC

## 2022-08-01 MED ORDER — IPRATROPIUM-ALBUTEROL 0.5-2.5 (3) MG/3ML IN SOLN
3.0000 mL | Freq: Once | RESPIRATORY_TRACT | Status: AC
  Administered 2022-08-01: 3 mL via RESPIRATORY_TRACT
  Filled 2022-08-01: qty 3

## 2022-08-01 MED ORDER — PREDNISONE 20 MG PO TABS: 40.0000 mg | ORAL_TABLET | Freq: Every day | ORAL | 0 refills | Status: AC

## 2022-08-01 NOTE — ED Triage Notes (Signed)
Patient to ER for c/o cough, fever, and body aches. Patient denies having thermometer to know temperature. Patient has + productive cough with yellow phlegm.

## 2022-08-01 NOTE — Progress Notes (Addendum)
Code Stroke activated @ 1035, pt was in CT at cart activation.  [facility] paged @ 1040.  Spoke with Bhagat by phone at 1057-in CT, then MRI with the pt.  No TNK per Bhagat.  Telestroke Charity fundraiser

## 2022-08-01 NOTE — ED Notes (Signed)
To CT

## 2022-08-01 NOTE — ED Notes (Signed)
Neurologist canceled code stroke 

## 2022-08-01 NOTE — ED Notes (Signed)
Accompanied pt to CT. Neurologist examined pt and found that R sided sensory loss to face, arm and leg had occurred with prior stroke then resolved. Pt may have experienced reactivation of this due to respiratory illness. IV successfully placed in CT, then pt brought directly to MRI. Pt currently in MRI, this RN with neurologist. Pt still has blurry vision but no visual field deficits or gaze palsy.

## 2022-08-01 NOTE — ED Provider Notes (Signed)
[facility] Provider Note    Event Date/Time   First MD Initiated Contact with Patient 08/01/22 1024     (approximate)   History   Cough and Code Stroke   HPI  TIMOTHY TOWNSEL is a 61 y.o. female   extensive past medical history including GERD, headache disorder, poorly controlled hypertension, remote CVA presents to the ER for evaluation of cough congestion some chest discomfort.  Was seen in her minor care area initially had complaint of some unsteadiness with walking down the hall as well as blurred vision.  Code stroke was called.  Patient is abided by neurology and code stroke was canceled.  She denies any measured temperature but has had some chills.  No known sick contacts.  States that she is coughing up yellow phlegm.      Physical Exam   Triage Vital Signs: ED Triage Vitals  Enc Vitals Group     BP 08/01/22 0952 (!) 142/86     Pulse Rate 08/01/22 0952 85     Resp 08/01/22 0952 18     Temp 08/01/22 0952 98.6 F (37 C)     Temp Source 08/01/22 0952 Oral     SpO2 08/01/22 0952 96 %     Weight 08/01/22 0953 191 lb (86.6 kg)     Height 08/01/22 0953 5\' 3"  (1.6 m)     Head Circumference --      Peak Flow --      Pain Score 08/01/22 0953 5     Pain Loc --      Pain Edu? --      Excl. in [place]? --     Most recent vital signs: Vitals:   08/01/22 1309 08/01/22 1310  BP: (!) 152/88 (!) 146/88  Pulse: 67 77  Resp: (!) 23 17  Temp:  98.3 F (36.8 C)  SpO2: 100% 100%     Constitutional: Alert  Eyes: Conjunctivae are normal.  Head: Atraumatic. Nose: No congestion/rhinnorhea. Mouth/Throat: Mucous membranes are moist.   Neck: Painless ROM.  Cardiovascular:   Good peripheral circulation. Respiratory: Normal respiratory effort.  No retractions.  Gastrointestinal: Soft and nontender.  Musculoskeletal:  no deformity Neurologic:  CN- intact.  No facial droop, Normal FNF.  Normal heel to shin.  Sensation intact bilaterally. Normal speech and  language. No gross focal neurologic deficits are appreciated. No gait instability.  Skin:  Skin is warm, dry and intact. No rash noted. Psychiatric: Mood and affect are normal. Speech and behavior are normal.    ED Results / Procedures / Treatments   Labs (all labs ordered are listed, but only abnormal results are displayed) Labs Reviewed  COMPREHENSIVE METABOLIC PANEL - Abnormal; Notable for the following components:      Result Value   Glucose, Bld 109 (*)    All other components within normal limits  CBG MONITORING, ED - Abnormal; Notable for the following components:   Glucose-Capillary 158 (*)    All other components within normal limits  SARS CORONAVIRUS 2 BY RT PCR  PROTIME-INR  APTT  CBC  DIFFERENTIAL  ETHANOL  CBG MONITORING, ED  TROPONIN I (HIGH SENSITIVITY)     EKG  ED ECG REPORT I, 10/01/22, the attending physician, personally viewed and interpreted this ECG.   Date: 08/01/2022  EKG Time: 13:16  Rate: 60  Rhythm: sinus  Axis: normal  Intervals: normal  ST&T Change: no stemi, no depressions    RADIOLOGY Please see ED Course for  my review and interpretation.  I personally reviewed all radiographic images ordered to evaluate for the above acute complaints and reviewed radiology reports and findings.  These findings were personally discussed with the patient.  Please see medical record for radiology report.    PROCEDURES:  Critical Care performed: No  Procedures   MEDICATIONS ORDERED IN ED: Medications  sodium chloride flush (NS) 0.9 % injection 3 mL (has no administration in time range)  sodium chloride 0.9 % bolus 500 mL (500 mLs Intravenous New Bag/Given 08/01/22 1306)  ipratropium-albuterol (DUONEB) 0.5-2.5 (3) MG/3ML nebulizer solution 3 mL (3 mLs Nebulization Given 08/01/22 1401)     IMPRESSION / MDM / ASSESSMENT AND PLAN / ED COURSE  I reviewed the triage vital signs and the nursing notes.                               Differential diagnosis includes, but is not limited to, pna, bronchitis, copd, chf, cva, tia, hypoglycemia, dehydration, electrolyte abnormality,   Patient presenting to the ER for evaluation of symptoms as described above.This presenting complaint could reflect a potentially life-threatening illness therefore the patient will be placed on continuous pulse oximetry and telemetry for monitoring.  Laboratory evaluation will be sent to evaluate for the above complaints.  Patient initially seen and monitored.  Area and started having sudden onset right-sided weakness and blurry vision therefore code stroke was called.  Patient was evaluated by neurology taken emergently to CT imaging as well as MRI not felt to be a code stroke felt to be likely persistent recrudescence of her previous CVA.  She is complaining of cough and congestion.  Will work-up for bronchitis.  Does not seem consistent with PE.  Doubt ACS.  She is not septic.  Clinical Course as of 08/01/22 1415  Sat Aug 01, 2022  1134 Chest x-ray on my review and interpretation does not show any evidence of pneumothorax or consolidation. [PR]  1412 Patient reassessed feeling significantly improved.  Do think that she has a component of bronchitis will give short course of low-dose steroid as well as azithromycin given her productive cough.  Patient agreeable to plan.  Does appear stable and appropriate for outpatient follow-up. [PR]    Clinical Course User Index [PR] Willy Eddy, MD      FINAL CLINICAL IMPRESSION(S) / ED DIAGNOSES   Final diagnoses:  Acute cough  Bronchitis     Rx / DC Orders   ED Discharge Orders          Ordered    predniSONE (DELTASONE) 20 MG tablet  Daily        08/01/22 1413    azithromycin (ZITHROMAX Z-PAK) 250 MG tablet        08/01/22 1413             Note:  This document was prepared using Dragon voice recognition software and may include unintentional dictation errors.    Willy Eddy, MD 08/01/22 1415

## 2022-08-01 NOTE — Progress Notes (Signed)
Chaplain responded to code stoke. Pt was unavailable. Patient's sister was present in the room. She was pleasant and receptive to Chaplain's visit. Chaplain offered compassionate presence and empathetic listening as Shwanda's sister shared what happened to her sister. Chaplain educated Filicia's sister on how Chaplain can be of assistance to her and how to get in contact with Chaplain if needed.

## 2022-08-01 NOTE — ED Notes (Signed)
Pt coughing up copious amounts of yellow phlegm. PA talking to EDP to discuss code stroke or not. This RN spoke with PA regarding concern of sudden dizziness and loss of balance while walking to room. Pt continues to endorse blurry vision. CBG is 158. Paramedic performing EKG while PA speaks with EDP.

## 2022-08-01 NOTE — ED Notes (Signed)
Called Code Stroke to [facility]  [phone]

## 2022-08-01 NOTE — ED Notes (Signed)
Pt to ED for productive cough since 2 months.   Was walking pt back to room about 3 minutes ago and pt suddenly became dizzy and began clutching onto wall. Pt placed in wheelchair, brought to room. Pt states dizziness has resolved but pt endorses blurry vision to both eyes especially when looking to R.   Pt states became dizzy 3 days ago at home and fell in bathtub. Did not hit head.  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 Pt endorses current mild HA to same area. Provider notified who is examining pt at bedside.

## 2022-08-01 NOTE — ED Provider Notes (Signed)
[facility] Provider Note    Event Date/Time   First MD Initiated Contact with Patient 08/01/22 1024     (approximate)   History   Cough and Code Stroke   HPI  Elizabeth Coffey is a 61 y.o. female   presents to the ED with complaint of cough, fever, body aches for 2 months however when patient was being walked to pod D for evaluation of the above symptoms she had sudden onset of dizziness, blurred vision and gait was staggered.  Patient denies the symptoms as ongoing.  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      Physical Exam   Triage Vital Signs: ED Triage Vitals  Enc Vitals Group     BP 08/01/22 0952 (!) 142/86     Pulse Rate 08/01/22 0952 85     Resp 08/01/22 0952 18     Temp 08/01/22 0952 98.6 F (37 C)     Temp Source 08/01/22 0952 Oral     SpO2 08/01/22 0952 96 %     Weight 08/01/22 0953 191 lb (86.6 kg)     Height 08/01/22 0953 5\' 3"  (1.6 m)     Head Circumference --      Peak Flow --      Pain Score 08/01/22 0953 5     Pain Loc --      Pain Edu? --      Excl. in GC? --     Most recent vital signs: Vitals:   08/01/22 1310 08/01/22 1430  BP: (!) 146/88 (!) 155/92  Pulse: 77 72  Resp: 17 20  Temp: 98.3 F (36.8 C) 98.5 F (36.9 C)  SpO2: 100% 97%     General: Awake, no distress.  Alert, talkative and answering questions appropriately.  No obvious facial droop was noted. CV:  Good peripheral perfusion.  Resp:  Normal effort.  Congested cough while talking. Abd:  No distention.  Other:  Cranial nerves II through XII grossly intact.  No facial droop noted however patient reports decreased sensation on the left of her face in comparison with the right.     ED Results / Procedures / Treatments   Labs (all labs ordered are listed, but only abnormal results are displayed) Labs Reviewed  COMPREHENSIVE  METABOLIC PANEL - Abnormal; Notable for the following components:      Result Value   Glucose, Bld 109 (*)    All other components within normal limits  CBG MONITORING, ED - Abnormal; Notable for the following components:   Glucose-Capillary 158 (*)    All other components within normal limits  SARS CORONAVIRUS 2 BY RT PCR  PROTIME-INR  APTT  CBC  DIFFERENTIAL  ETHANOL  CBG MONITORING, ED  TROPONIN I (HIGH SENSITIVITY)  TROPONIN I (HIGH SENSITIVITY)      PROCEDURES:  Critical Care performed:   Procedures   MEDICATIONS ORDERED IN ED: Medications  sodium chloride flush (NS) 0.9 % injection 3 mL (has no administration in time range)  sodium chloride 0.9 % bolus 500 mL (0 mLs Intravenous Stopped 08/01/22 1415)  ipratropium-albuterol (DUONEB) 0.5-2.5 (3) MG/3ML nebulizer solution 3 mL (3 mLs Nebulization Given 08/01/22 1401)     IMPRESSION / MDM / ASSESSMENT AND PLAN / ED COURSE  I reviewed the triage vital signs and the nursing notes.   Differential diagnosis includes, but is not  limited to, acute CVA with sudden onset of dizziness, blurred vision and altered gait at approximately 10:05 AM.  Code stroke was called and patient was transferred to major after being taken to CT by RN for a CT angio head.  Dr. Roxan Hockey was made aware as the room that patient is being placed in is on his side.      Clinical Course as of 08/01/22 1603  Sat Aug 01, 2022  1134 Chest x-ray on my review and interpretation does not show any evidence of pneumothorax or consolidation. [PR]  1412 Patient reassessed feeling significantly improved.  Do think that she has a component of bronchitis will give short course of low-dose steroid as well as azithromycin given her productive cough.  Patient agreeable to plan.  Does appear stable and appropriate for outpatient follow-up. [PR]    Clinical Course User Index [PR] Willy Eddy, MD   Patient's presentation is most consistent with acute presentation  with potential threat to life or bodily function.  FINAL CLINICAL IMPRESSION(S) / ED DIAGNOSES   Final diagnoses:  Acute cough  Bronchitis  Dizziness     Rx / DC Orders   ED Discharge Orders          Ordered    predniSONE (DELTASONE) 20 MG tablet  Daily        08/01/22 1413    azithromycin (ZITHROMAX Z-PAK) 250 MG tablet        08/01/22 1413             Note:  This document was prepared using Dragon voice recognition software and may include unintentional dictation errors.   Tommi Rumps, PA-C 08/01/22 1603    Concha Se, MD 08/02/22 [phone]

## 2022-08-01 NOTE — Consult Note (Addendum)
Neurology Consultation Reason for Consult: Code stroke Requesting Physician: Willy Eddy  CC: Chronic cough x2 months, fatigue, dizziness  History is obtained from: Patient, ED triage nurse and chart review  HPI: Elizabeth Coffey is a 61 y.o. female with a past medical history of prior right thalamic stroke (3/21, left-sided numbness and facial droop no significant residual), and prior stroke presenting with right-sided numbness with no residual, hypertension, anxiety, and arthritis  She presented to the ED for evaluation of cough that has been going on for past 2 months after family member encouraged her to seek medical care.  While ambulating with the triage nurse she suddenly lost her balance and was clutching the guardrail on the wall.  Subsequently she complained of right-sided numbness which she has had during a prior stroke but which had previously resolved, and code stroke was activated  LKW: 10:05 AM tPA given?: No, imaging negative for stroke IA performed?: No, exam not consistent with LVO  Premorbid modified rankin scale:      0 - No symptoms.  ROS: Limited review of systems consisted of confirming no contraindications to TNK.  She did report some vaginal bleeding for which she is pending evaluation, headaches, weight loss from 215 pounds to 191 pounds per her report over the past 2 months, and waking up with drenching sweats in the past 2 months.  She denies coughing up blood but does endorse a cough for 2 months with purulent sputum.  She notes that her reason for coming in now for evaluation was due to family member concerns  Past Medical History:  Diagnosis Date   Anxiety    Arthritis    Hypertension    Peptic ulcer    Past Surgical History:  Procedure Laterality Date   ABDOMINAL HYSTERECTOMY     BREAST CYST EXCISION     Current Outpatient Medications  Medication Instructions   albuterol-ipratropium (COMBIVENT) 18-103 MCG/ACT inhaler 1 puff, Inhalation, Every 4 hours  PRN   amLODipine-benazepril (LOTREL) 10-20 MG capsule 1 capsule, Oral, Daily   aspirin EC 81 mg, Oral, Daily   DULoxetine (CYMBALTA) 30 mg, Oral, Daily, For mood   linaclotide (LINZESS) 290 MCG CAPS capsule Oral   nortriptyline (PAMELOR) 20 mg, Oral, Daily at bedtime   omeprazole (PRILOSEC) 40 mg, Daily   QUEtiapine (SEROQUEL) 25 mg, Oral, Daily at bedtime, For mood and sleep   simvastatin (ZOCOR) 20 mg, Oral, Every evening   traMADol (ULTRAM) 50 mg, Oral, Every 6 hours PRN   Family History  Problem Relation Age of Onset   Mental illness Maternal Uncle     Social History:  reports that she has never smoked. She has never used smokeless tobacco. She reports that she does not drink alcohol and does not use drugs.  Exam: Current vital signs: BP (!) 142/86 (BP Location: Left Arm)   Pulse 85   Temp 98.6 F (37 C) (Oral)   Resp 18   Ht 5\' 3"  (1.6 m)   Wt 94.6 kg   SpO2 96%   BMI 36.95 kg/m  Vital signs in last 24 hours: Temp:  [98.6 F (37 C)] 98.6 F (37 C) (08/05 0952) Pulse Rate:  [85] 85 (08/05 0952) Resp:  [18] 18 (08/05 0952) BP: (142)/(86) 142/86 (08/05 0952) SpO2:  [96 %] 96 % (08/05 0952) Weight:  [86.6 kg-94.6 kg] 94.6 kg (08/05 1112)   Physical Exam  Constitutional: Appears well-developed and well-nourished.  Psych: Affect calm and cooperative Eyes: No scleral injection HENT: No  oropharyngeal obstruction.  MSK: no joint deformities.  Cardiovascular: Perfusing extremities well Respiratory: Effort normal, non-labored breathing GI: Soft.  No distension. There is no tenderness.  Skin: Warm dry and intact visible skin  Neuro: Mental Status: Patient is awake, alert, oriented to person, place, month, age, and situation. Patient is able to give a clear and coherent history. No signs of aphasia or neglect Cranial Nerves: II: Visual Fields are full. Pupils are equal, round, and reactive to light.   III,IV, VI: EOMI without ptosis or diploplia.  V: Facial  sensation is reduced on the right VII: Facial movement is symmetric.  VIII: hearing is intact to voice X: Uvula elevates symmetrically XI: Shoulder shrug is symmetric. XII: tongue is midline without atrophy or fasciculations.  Motor: No pronator drift of the bilateral upper extremities.  No drift of the bilateral lower extremities Sensory: Sensation is reduced to light touch in the right arm and leg Plantars: Toes are mute bilaterally Cerebellar: FNF and HKS are intact bilaterally Gait:  Ambulated from scanner to stretcher a few steps steadily using both legs equally with no dizziness, no focal weakness  NIHSS total 1 Score breakdown: Right sided face/arm/leg mild numbness Performed at 10:32 AM  I have reviewed labs in epic and the results pertinent to this consultation are:  Basic Metabolic Panel: No results for input(s): "NA", "K", "CL", "CO2", "GLUCOSE", "BUN", "CREATININE", "CALCIUM", "MG", "PHOS" in the last 168 hours.  CBC: Recent Labs  Lab 08/01/22 1034  WBC 6.2  NEUTROABS 3.7  HGB 12.7  HCT 39.4  MCV 82.1  PLT 322    Coagulation Studies: Recent Labs    08/01/22 1034  LABPROT 12.6  INR 1.0     Head CT and MRI brain personally reviewed, agree with radiology no acute findings   Impression: Given negative imaging, favor recrudescence of prior stroke symptoms in the setting of acute illness /potentially orthostatic hypotension as the symptoms occurred while walking.  Neurological examination is reassuring with the only focal finding being right-sided numbness which she has had before during her first stroke.  Notably, no signs of brainstem dysfunction on cranial nerve examination.  Code stroke recommendations: -MRI brain to rule out acute intracranial process  Additional recommendations: -Cancel code stroke -Orthostatic vital signs -Further work-up of chronic cough and dizziness per ED -Neurology will be available as needed, reach out if new questions or  concerns arise   Brooke Dare MD-PhD [facility] [phone] [facility] coverage for [facility] is from 8 AM to 4 AM in-house and 4 PM to 8 PM by telephone/video. 8 PM to 8 AM emergent questions or overnight urgent questions should be addressed to Teleneurology On-call or [facility] neurohospitalist; contact information can be found on AMION  Total critical care time: 40 minutes   Critical care time was exclusive of separately billable procedures and treating other patients.   Critical care was necessary to treat or prevent imminent or life-threatening deterioration.   Critical care was time spent personally by me on the following activities: development of treatment plan with patient and/or surrogate as well as nursing, discussions with consultants/primary team, evaluation of patient's response to treatment, examination of patient, obtaining history from patient or surrogate, ordering and performing treatments and interventions, ordering and review of laboratory studies, ordering and review of radiographic studies, and re-evaluation of patient's condition as needed, as documented above.

## 2022-08-01 NOTE — ED Notes (Signed)
Teleneuro cart activated.  

## 2022-08-01 NOTE — ED Notes (Signed)
Cancelled code stroke per Dr. Iver Nestle with [facility]  1114

## 2022-12-17 ENCOUNTER — Encounter: Admission: RE | Payer: Self-pay | Source: Home / Self Care

## 2022-12-17 ENCOUNTER — Ambulatory Visit: Admission: RE | Admit: 2022-12-17 | Payer: Medicare Other | Source: Home / Self Care | Admitting: Gastroenterology

## 2022-12-17 SURGERY — COLONOSCOPY
Anesthesia: General

## 2023-05-04 DIAGNOSIS — R6889 Other general symptoms and signs: Secondary | ICD-10-CM | POA: Diagnosis not present

## 2023-05-04 DIAGNOSIS — R7989 Other specified abnormal findings of blood chemistry: Secondary | ICD-10-CM | POA: Diagnosis not present

## 2023-05-04 DIAGNOSIS — N1831 Chronic kidney disease, stage 3a: Secondary | ICD-10-CM | POA: Diagnosis not present

## 2023-05-04 DIAGNOSIS — E1122 Type 2 diabetes mellitus with diabetic chronic kidney disease: Secondary | ICD-10-CM | POA: Diagnosis not present

## 2023-05-04 DIAGNOSIS — Z114 Encounter for screening for human immunodeficiency virus [HIV]: Secondary | ICD-10-CM | POA: Diagnosis not present

## 2023-05-04 DIAGNOSIS — N939 Abnormal uterine and vaginal bleeding, unspecified: Secondary | ICD-10-CM | POA: Diagnosis not present

## 2023-05-04 DIAGNOSIS — Z7721 Contact with and (suspected) exposure to potentially hazardous body fluids: Secondary | ICD-10-CM | POA: Diagnosis not present

## 2023-05-04 DIAGNOSIS — R519 Headache, unspecified: Secondary | ICD-10-CM | POA: Diagnosis not present

## 2023-05-14 DIAGNOSIS — R6889 Other general symptoms and signs: Secondary | ICD-10-CM | POA: Diagnosis not present

## 2023-05-14 DIAGNOSIS — G4730 Sleep apnea, unspecified: Secondary | ICD-10-CM | POA: Diagnosis not present

## 2023-05-14 DIAGNOSIS — K219 Gastro-esophageal reflux disease without esophagitis: Secondary | ICD-10-CM | POA: Diagnosis not present

## 2023-05-14 DIAGNOSIS — I2089 Other forms of angina pectoris: Secondary | ICD-10-CM | POA: Diagnosis not present

## 2023-05-14 DIAGNOSIS — I10 Essential (primary) hypertension: Secondary | ICD-10-CM | POA: Diagnosis not present

## 2023-05-14 DIAGNOSIS — Z8673 Personal history of transient ischemic attack (TIA), and cerebral infarction without residual deficits: Secondary | ICD-10-CM | POA: Diagnosis not present

## 2023-05-14 DIAGNOSIS — F411 Generalized anxiety disorder: Secondary | ICD-10-CM | POA: Diagnosis not present

## 2023-05-14 DIAGNOSIS — E6609 Other obesity due to excess calories: Secondary | ICD-10-CM | POA: Diagnosis not present

## 2023-05-14 DIAGNOSIS — R0602 Shortness of breath: Secondary | ICD-10-CM | POA: Diagnosis not present

## 2023-05-14 DIAGNOSIS — Z87891 Personal history of nicotine dependence: Secondary | ICD-10-CM | POA: Diagnosis not present

## 2023-06-28 DIAGNOSIS — I129 Hypertensive chronic kidney disease with stage 1 through stage 4 chronic kidney disease, or unspecified chronic kidney disease: Secondary | ICD-10-CM | POA: Diagnosis not present

## 2023-06-28 DIAGNOSIS — E6609 Other obesity due to excess calories: Secondary | ICD-10-CM | POA: Diagnosis not present

## 2023-06-28 DIAGNOSIS — F3340 Major depressive disorder, recurrent, in remission, unspecified: Secondary | ICD-10-CM | POA: Diagnosis not present

## 2023-06-28 DIAGNOSIS — E1122 Type 2 diabetes mellitus with diabetic chronic kidney disease: Secondary | ICD-10-CM | POA: Diagnosis not present

## 2023-06-28 DIAGNOSIS — R6889 Other general symptoms and signs: Secondary | ICD-10-CM | POA: Diagnosis not present

## 2023-06-28 DIAGNOSIS — N1831 Chronic kidney disease, stage 3a: Secondary | ICD-10-CM | POA: Diagnosis not present

## 2023-06-28 DIAGNOSIS — Z87891 Personal history of nicotine dependence: Secondary | ICD-10-CM | POA: Diagnosis not present

## 2023-06-28 DIAGNOSIS — Z6836 Body mass index (BMI) 36.0-36.9, adult: Secondary | ICD-10-CM | POA: Diagnosis not present

## 2023-08-02 ENCOUNTER — Other Ambulatory Visit: Payer: Self-pay

## 2023-08-02 ENCOUNTER — Emergency Department: Payer: Medicare HMO

## 2023-08-02 ENCOUNTER — Encounter: Payer: Self-pay | Admitting: *Deleted

## 2023-08-02 ENCOUNTER — Emergency Department
Admission: EM | Admit: 2023-08-02 | Discharge: 2023-08-03 | Disposition: A | Discharge status: Home / Self Care | Payer: Medicare HMO | Source: Home / Self Care | Attending: Emergency Medicine | Admitting: Emergency Medicine

## 2023-08-02 DIAGNOSIS — E1122 Type 2 diabetes mellitus with diabetic chronic kidney disease: Secondary | ICD-10-CM | POA: Diagnosis not present

## 2023-08-02 DIAGNOSIS — E876 Hypokalemia: Secondary | ICD-10-CM | POA: Insufficient documentation

## 2023-08-02 DIAGNOSIS — R1013 Epigastric pain: Secondary | ICD-10-CM | POA: Diagnosis present

## 2023-08-02 DIAGNOSIS — N189 Chronic kidney disease, unspecified: Secondary | ICD-10-CM | POA: Insufficient documentation

## 2023-08-02 DIAGNOSIS — I129 Hypertensive chronic kidney disease with stage 1 through stage 4 chronic kidney disease, or unspecified chronic kidney disease: Secondary | ICD-10-CM | POA: Insufficient documentation

## 2023-08-02 DIAGNOSIS — R519 Headache, unspecified: Secondary | ICD-10-CM | POA: Insufficient documentation

## 2023-08-02 LAB — CBC
HCT: 36.4 % (ref 36.0–46.0)
Hemoglobin: 11.8 g/dL — ABNORMAL LOW (ref 12.0–15.0)
MCH: 26.4 pg (ref 26.0–34.0)
MCHC: 32.4 g/dL (ref 30.0–36.0)
MCV: 81.4 fL (ref 80.0–100.0)
Platelets: 270 10*3/uL (ref 150–400)
RBC: 4.47 MIL/uL (ref 3.87–5.11)
RDW: 14.4 % (ref 11.5–15.5)
WBC: 6.3 10*3/uL (ref 4.0–10.5)
nRBC: 0 % (ref 0.0–0.2)

## 2023-08-02 LAB — COMPREHENSIVE METABOLIC PANEL
ALT: 14 U/L (ref 0–44)
AST: 21 U/L (ref 15–41)
Albumin: 3.7 g/dL (ref 3.5–5.0)
Alkaline Phosphatase: 78 U/L (ref 38–126)
Anion gap: 9 (ref 5–15)
BUN: 27 mg/dL — ABNORMAL HIGH (ref 8–23)
CO2: 30 mmol/L (ref 22–32)
Calcium: 9.9 mg/dL (ref 8.9–10.3)
Chloride: 102 mmol/L (ref 98–111)
Creatinine, Ser: 1.14 mg/dL — ABNORMAL HIGH (ref 0.44–1.00)
GFR, Estimated: 54 mL/min — ABNORMAL LOW (ref >60)
Glucose, Bld: 121 mg/dL — ABNORMAL HIGH (ref 70–99)
Potassium: 2.6 mmol/L — CL (ref 3.5–5.1)
Sodium: 141 mmol/L (ref 135–145)
Total Bilirubin: 0.3 mg/dL (ref 0.3–1.2)
Total Protein: 7.1 g/dL (ref 6.5–8.1)

## 2023-08-02 LAB — PROTIME-INR
INR: 1 (ref 0.8–1.2)
Prothrombin Time: 13.3 seconds (ref 11.4–15.2)

## 2023-08-02 LAB — TROPONIN I (HIGH SENSITIVITY)
Troponin I (High Sensitivity): 6 ng/L (ref <18)
Troponin I (High Sensitivity): 6 ng/L (ref <18)

## 2023-08-02 LAB — LIPASE, BLOOD: Lipase: 32 U/L (ref 11–51)

## 2023-08-02 NOTE — ED Triage Notes (Addendum)
Pt brought in via ems from home.  Pt has abd pain.  Pt reports vomiting yesterday.  Pt reports a headache with numbness in left side of face for 2 weeks. No chest pain or sob.  Pt alert. Hx cva.  Iv in place.

## 2023-08-02 NOTE — ED Triage Notes (Signed)
Ems report - ems from home for left side weakness, HA, blurry vision that started 2 weeks ago. Pt with hx CVA 2021, 2023.

## 2023-08-03 ENCOUNTER — Emergency Department: Payer: Medicare HMO

## 2023-08-03 MED ORDER — SUCRALFATE 1 G PO TABS
1.0000 g | ORAL_TABLET | Freq: Once | ORAL | Status: AC
  Administered 2023-08-03: 1 g via ORAL
  Filled 2023-08-03: qty 1

## 2023-08-03 MED ORDER — IOHEXOL 300 MG/ML  SOLN
100.0000 mL | Freq: Once | INTRAMUSCULAR | Status: AC | PRN
  Administered 2023-08-03: 100 mL via INTRAVENOUS

## 2023-08-03 MED ORDER — ONDANSETRON HCL 4 MG/2ML IJ SOLN
4.0000 mg | Freq: Once | INTRAMUSCULAR | Status: AC
  Administered 2023-08-03: 4 mg via INTRAVENOUS
  Filled 2023-08-03: qty 2

## 2023-08-03 MED ORDER — ALUM & MAG HYDROXIDE-SIMETH 200-200-20 MG/5ML PO SUSP
30.0000 mL | Freq: Once | ORAL | Status: AC
  Administered 2023-08-03: 30 mL via ORAL
  Filled 2023-08-03: qty 30

## 2023-08-03 MED ORDER — SUCRALFATE 1 G PO TABS: 1.0000 g | ORAL_TABLET | Freq: Four times a day (QID) | ORAL | 1 refills | Status: DC

## 2023-08-03 MED ORDER — POTASSIUM CHLORIDE CRYS ER 20 MEQ PO TBCR
40.0000 meq | EXTENDED_RELEASE_TABLET | Freq: Once | ORAL | Status: AC
  Administered 2023-08-03: 40 meq via ORAL
  Filled 2023-08-03: qty 2

## 2023-08-03 MED ORDER — SUCRALFATE 1 G PO TABS: 1.0000 g | ORAL_TABLET | Freq: Four times a day (QID) | ORAL | 1 refills | Status: AC

## 2023-08-03 MED ORDER — LACTATED RINGERS IV BOLUS
1000.0000 mL | Freq: Once | INTRAVENOUS | Status: AC
  Administered 2023-08-03: 1000 mL via INTRAVENOUS

## 2023-08-03 NOTE — Discharge Instructions (Addendum)
Continue taking your omeprazole/Prilosec medication for acid.    I am also prescribing prescription for sucralfate/Carafate to use up to 4 times per day with meals to help with acid.  Follow-up with your regular doctor for recheck as well as a potassium level recheck

## 2023-08-03 NOTE — ED Provider Notes (Signed)
[facility] Provider Note    Event Date/Time   First MD Initiated Contact with Patient 08/02/23 2350     (approximate)   History   Abdominal Pain   HPI  Elizabeth Coffey is a 62 y.o. female who presents to the ED for evaluation of Abdominal Pain   I reviewed PCP visit from 1 month ago.  Obese patient with history of HTN, DM, GERD, CKD  3-4 weeks of upper abd pain. Bloating, constipation. Poor appetite. Emesis yesterday, then good appetite after. No other emesis.   Walking to the mailbox and feeling dizzy earlier this afternoon without falls or syncope, due to this she called into a nursing line who recommends ED for evaluation.  Physical Exam   Triage Vital Signs: ED Triage Vitals  Encounter Vitals Group     BP 08/02/23 1808 113/75     Systolic BP Percentile --      Diastolic BP Percentile --      Pulse Rate 08/02/23 1808 62     Resp 08/02/23 1808 18     Temp 08/02/23 1808 98.3 F (36.8 C)     Temp Source 08/02/23 1808 Oral     SpO2 08/02/23 1808 97 %     Weight 08/02/23 1803 184 lb (83.5 kg)     Height 08/02/23 1803 5\' 3"  (1.6 m)     Head Circumference --      Peak Flow --      Pain Score 08/02/23 1803 3     Pain Loc --      Pain Education --      Exclude from Growth Chart --     Most recent vital signs: Vitals:   08/03/23 0315 08/03/23 0322  BP:  128/74  Pulse: 66   Resp: 16   Temp:    SpO2: 95%     General: Awake, no distress.  CV:  Good peripheral perfusion.  Resp:  Normal effort.  Abd:  No distention. Diffuse and mild TTP, more pronounced in the upper abd MSK:  No deformity noted.  Neuro:  No focal deficits appreciated. Other:     ED Results / Procedures / Treatments   Labs (all labs ordered are listed, but only abnormal results are displayed) Labs Reviewed  COMPREHENSIVE METABOLIC PANEL - Abnormal; Notable for the following components:      Result Value   Potassium 2.6 (*)    Glucose, Bld 121 (*)    BUN 27 (*)     Creatinine, Ser 1.14 (*)    GFR, Estimated 54 (*)    All other components within normal limits  CBC - Abnormal; Notable for the following components:   Hemoglobin 11.8 (*)    All other components within normal limits  URINALYSIS, ROUTINE W REFLEX MICROSCOPIC - Abnormal; Notable for the following components:   Color, Urine YELLOW (*)    APPearance CLOUDY (*)    Leukocytes,Ua TRACE (*)    Bacteria, UA RARE (*)    All other components within normal limits  LIPASE, BLOOD  PROTIME-INR  TROPONIN I (HIGH SENSITIVITY)  TROPONIN I (HIGH SENSITIVITY)    EKG Sinus rhythm with a rate of 71 bpm.  Normal axis.  QTc 519.  No STEMI.  RADIOLOGY CT head interpreted by me without evidence of acute intracranial pathology CXR interpreted by me without evidence of acute cardiopulmonary pathology.   Official radiology report(s): CT ABDOMEN PELVIS W CONTRAST  Result Date: 08/03/2023 CLINICAL DATA:  Epigastric abdominal  pain, vomiting EXAM: CT ABDOMEN AND PELVIS WITH CONTRAST TECHNIQUE: Multidetector CT imaging of the abdomen and pelvis was performed using the standard protocol following bolus administration of intravenous contrast. RADIATION DOSE REDUCTION: This exam was performed according to the departmental dose-optimization program which includes automated exposure control, adjustment of the mA and/or kV according to patient size and/or use of iterative reconstruction technique. CONTRAST:  OMNIPAQUE IOHEXOL 300 MG/ML  SOLN COMPARISON:  06/11/2022 FINDINGS: Lower chest: No acute abnormality. Hepatobiliary: No focal liver abnormality is seen. No gallstones, gallbladder wall thickening, or biliary dilatation. Pancreas: Unremarkable. No pancreatic ductal dilatation or surrounding inflammatory changes. Spleen: Normal in size without focal abnormality. Adrenals/Urinary Tract: The adrenal glands are unremarkable. The kidneys are normal in size and position. A 19 mm cortical hypodense lesion is identified  measuring 22 Hounsfield units in density, mildly enlarged since prior examination where this measured 15 mm. This is best characterized as a Bosniak class 2 cyst on this portal venous phase CT examination and no further follow-up imaging is recommended. Additional cortical hypodensities are too small to accurately characterize but likely represent multiple cortical cysts. No follow-up imaging is recommended for these lesions as well. The kidneys are otherwise unremarkable. The bladder is unremarkable. Stomach/Bowel: Mild distal colonic diverticulosis without superimposed acute inflammatory change. Stomach, small bowel, and large bowel are otherwise unremarkable. Appendix absent. No free intraperitoneal gas or fluid. Vascular/Lymphatic: Mild aortoiliac atherosclerotic calcification. No aortic aneurysm. No pathologic adenopathy within the abdomen and pelvis. Reproductive: Status post hysterectomy. No adnexal masses. Other: The no abdominal wall hernia Musculoskeletal: Osseous structures are age-appropriate. No acute bone abnormality. IMPRESSION: 1. No acute intra-abdominal pathology identified. No definite radiographic explanation for the patient's reported symptoms. 2. Mild distal colonic diverticulosis without superimposed acute inflammatory change. 3. Aortic atherosclerosis. Aortic Atherosclerosis (ICD10-I70.0). Electronically Signed   By: Helyn Numbers M.D.   On: 08/03/2023 00:58   CT Head Wo Contrast  Result Date: 08/02/2023 CLINICAL DATA:  Headache and left facial numbness. EXAM: CT HEAD WITHOUT CONTRAST TECHNIQUE: Contiguous axial images were obtained from the base of the skull through the vertex without intravenous contrast. RADIATION DOSE REDUCTION: This exam was performed according to the departmental dose-optimization program which includes automated exposure control, adjustment of the mA and/or kV according to patient size and/or use of iterative reconstruction technique. COMPARISON:  Head CT  08/01/2022.  MRI head 08/01/2022. FINDINGS: Brain: No evidence of acute infarction, hemorrhage, hydrocephalus, extra-axial collection or mass lesion/mass effect. Old lacunar infarct in the right thalamus is unchanged from the prior study. Vascular: Atherosclerotic calcifications are present within the cavernous internal carotid arteries. Skull: Normal. Negative for fracture or focal lesion. Sinuses/Orbits: No acute finding. Other: None. IMPRESSION: 1. No acute intracranial process. 2. Stable old lacunar infarct in the right thalamus. Electronically Signed   By: Darliss Cheney M.D.   On: 08/02/2023 18:54   DG Chest 2 View  Result Date: 08/02/2023 CLINICAL DATA:  Left-sided weakness EXAM: CHEST - 2 VIEW COMPARISON:  08/01/2022 FINDINGS: The heart size and mediastinal contours are within normal limits. Both lungs are clear. The visualized skeletal structures are unremarkable. IMPRESSION: No active cardiopulmonary disease. Electronically Signed   By: Charlett Nose M.D.   On: 08/02/2023 18:50    PROCEDURES and INTERVENTIONS:  .1-3 Lead EKG Interpretation  Performed by: Delton Prairie, MD Authorized by: Delton Prairie, MD     Interpretation: normal     ECG rate:  68   ECG rate assessment: normal     Rhythm:  sinus rhythm     Ectopy: none     Conduction: normal     Medications  ondansetron (ZOFRAN) injection 4 mg (4 mg Intravenous Given 08/03/23 0034)  potassium chloride SA (KLOR-CON M) CR tablet 40 mEq (40 mEq Oral Given 08/03/23 0035)  lactated ringers bolus 1,000 mL (1,000 mLs Intravenous New Bag/Given 08/03/23 0032)  iohexol (OMNIPAQUE) 300 MG/ML solution 100 mL (100 mLs Intravenous Contrast Given 08/03/23 0038)  sucralfate (CARAFATE) tablet 1 g (1 g Oral Given 08/03/23 0117)  alum & mag hydroxide-simeth (MAALOX/MYLANTA) 200-200-20 MG/5ML suspension 30 mL (30 mLs Oral Given 08/03/23 0117)  potassium chloride SA (KLOR-CON M) CR tablet 40 mEq (40 mEq Oral Given 08/03/23 0119)     IMPRESSION / MDM / ASSESSMENT  AND PLAN / ED COURSE  I reviewed the triage vital signs and the nursing notes.  Differential diagnosis includes, but is not limited to, ACS, dehydration, UTI, GERD or gastritis, hepatobiliary obstruction  {Patient presents with symptoms of an acute illness or injury that is potentially life-threatening.  Patient presents with subacute epigastric discomfort that may be of gastric etiology, with a benign workup and ultimately suitable for trial of outpatient management.  Mild tenderness but no peritoneal features.  Blood work with hypokalemia that I suspect is the etiology of some of her intermittent paresthesias.  Normal WBC and lipase.  Reassuring cardiac testing.  Her QTc is noted to be prolonged, likely secondary to her electrolyte derangements that are replaced orally.  Symptoms resolved after PPI administration with Carafate alongside replacement of her potassium.  Suitable for trial of outpatient management with PCP f/u.  Clinical Course as of 08/03/23 0359  Tue Aug 03, 2023  0236 Reassessed. Feeling much better [DS]    Clinical Course User Index [DS] Delton Prairie, MD     FINAL CLINICAL IMPRESSION(S) / ED DIAGNOSES   Final diagnoses:  Hypokalemia  Epigastric pain     Rx / DC Orders   ED Discharge Orders          Ordered    sucralfate (CARAFATE) 1 g tablet  4 times daily        08/03/23 0306             Note:  This document was prepared using Dragon voice recognition software and may include unintentional dictation errors.   Delton Prairie, MD 08/03/23 [phone]

## 2023-08-10 ENCOUNTER — Telehealth: Payer: Self-pay | Admitting: *Deleted

## 2023-08-10 NOTE — Telephone Encounter (Signed)
Transition Care Management Unsuccessful Follow-up Telephone Call  Date of discharge and from where:  [facility]  08/03/2023  Attempts: 1st  Reason for unsuccessful TCM follow-up call:  Left voice message

## 2023-08-12 ENCOUNTER — Telehealth: Payer: Self-pay | Admitting: *Deleted

## 2023-08-12 NOTE — Telephone Encounter (Signed)
Transition Care Management Unsuccessful Follow-up Telephone Call  Date of discharge and from where:  [facility] 08/03/2023  Attempts:  2nd Attempt  Reason for unsuccessful TCM follow-up call:  Unable to reach patient

## 2023-08-23 ENCOUNTER — Other Ambulatory Visit: Payer: Self-pay | Admitting: Internal Medicine

## 2023-08-23 DIAGNOSIS — Z1231 Encounter for screening mammogram for malignant neoplasm of breast: Secondary | ICD-10-CM

## 2023-09-07 DIAGNOSIS — R6889 Other general symptoms and signs: Secondary | ICD-10-CM | POA: Diagnosis not present

## 2023-09-07 DIAGNOSIS — Z6837 Body mass index (BMI) 37.0-37.9, adult: Secondary | ICD-10-CM | POA: Diagnosis not present

## 2023-09-07 DIAGNOSIS — N1831 Chronic kidney disease, stage 3a: Secondary | ICD-10-CM | POA: Diagnosis not present

## 2023-09-07 DIAGNOSIS — E6609 Other obesity due to excess calories: Secondary | ICD-10-CM | POA: Diagnosis not present

## 2023-09-07 DIAGNOSIS — Z1211 Encounter for screening for malignant neoplasm of colon: Secondary | ICD-10-CM | POA: Diagnosis not present

## 2023-09-07 DIAGNOSIS — E1122 Type 2 diabetes mellitus with diabetic chronic kidney disease: Secondary | ICD-10-CM | POA: Diagnosis not present

## 2023-09-07 DIAGNOSIS — Z1231 Encounter for screening mammogram for malignant neoplasm of breast: Secondary | ICD-10-CM | POA: Diagnosis not present

## 2023-09-07 DIAGNOSIS — F3340 Major depressive disorder, recurrent, in remission, unspecified: Secondary | ICD-10-CM | POA: Diagnosis not present

## 2023-09-07 DIAGNOSIS — M791 Myalgia, unspecified site: Secondary | ICD-10-CM | POA: Diagnosis not present

## 2023-09-07 DIAGNOSIS — I129 Hypertensive chronic kidney disease with stage 1 through stage 4 chronic kidney disease, or unspecified chronic kidney disease: Secondary | ICD-10-CM | POA: Diagnosis not present

## 2023-09-28 LAB — EXTERNAL GENERIC LAB PROCEDURE

## 2023-09-28 LAB — COLOGUARD

## 2023-09-29 ENCOUNTER — Ambulatory Visit
Admission: RE | Admit: 2023-09-29 | Discharge: 2023-09-29 | Disposition: A | Discharge status: Home / Self Care | Payer: Medicare HMO | Source: Ambulatory Visit | Attending: Internal Medicine | Admitting: Internal Medicine

## 2023-09-29 DIAGNOSIS — R6889 Other general symptoms and signs: Secondary | ICD-10-CM | POA: Diagnosis not present

## 2023-09-29 DIAGNOSIS — Z1231 Encounter for screening mammogram for malignant neoplasm of breast: Secondary | ICD-10-CM | POA: Diagnosis not present

## 2023-10-15 DIAGNOSIS — Z1211 Encounter for screening for malignant neoplasm of colon: Secondary | ICD-10-CM | POA: Diagnosis not present

## 2023-10-22 LAB — EXTERNAL GENERIC LAB PROCEDURE: COLOGUARD: NEGATIVE

## 2023-10-22 LAB — COLOGUARD: COLOGUARD: NEGATIVE

## 2023-11-17 DIAGNOSIS — Z8673 Personal history of transient ischemic attack (TIA), and cerebral infarction without residual deficits: Secondary | ICD-10-CM | POA: Diagnosis not present

## 2023-11-17 DIAGNOSIS — E66812 Obesity, class 2: Secondary | ICD-10-CM | POA: Diagnosis not present

## 2023-11-17 DIAGNOSIS — R0602 Shortness of breath: Secondary | ICD-10-CM | POA: Diagnosis not present

## 2023-11-17 DIAGNOSIS — G4730 Sleep apnea, unspecified: Secondary | ICD-10-CM | POA: Diagnosis not present

## 2023-11-17 DIAGNOSIS — K219 Gastro-esophageal reflux disease without esophagitis: Secondary | ICD-10-CM | POA: Diagnosis not present

## 2023-11-17 DIAGNOSIS — R6889 Other general symptoms and signs: Secondary | ICD-10-CM | POA: Diagnosis not present

## 2023-11-17 DIAGNOSIS — R519 Headache, unspecified: Secondary | ICD-10-CM | POA: Diagnosis not present

## 2023-11-17 DIAGNOSIS — I2089 Other forms of angina pectoris: Secondary | ICD-10-CM | POA: Diagnosis not present

## 2023-11-17 DIAGNOSIS — Z87891 Personal history of nicotine dependence: Secondary | ICD-10-CM | POA: Diagnosis not present

## 2023-11-17 DIAGNOSIS — I10 Essential (primary) hypertension: Secondary | ICD-10-CM | POA: Diagnosis not present

## 2024-02-07 ENCOUNTER — Emergency Department
Admission: EM | Admit: 2024-02-07 | Discharge: 2024-02-07 | Disposition: A | Discharge status: Home / Self Care | Payer: 59 | Attending: Emergency Medicine | Admitting: Emergency Medicine

## 2024-02-07 ENCOUNTER — Other Ambulatory Visit: Payer: Self-pay

## 2024-02-07 ENCOUNTER — Emergency Department: Payer: 59

## 2024-02-07 DIAGNOSIS — E876 Hypokalemia: Secondary | ICD-10-CM | POA: Diagnosis not present

## 2024-02-07 DIAGNOSIS — E119 Type 2 diabetes mellitus without complications: Secondary | ICD-10-CM | POA: Insufficient documentation

## 2024-02-07 DIAGNOSIS — K5730 Diverticulosis of large intestine without perforation or abscess without bleeding: Secondary | ICD-10-CM | POA: Insufficient documentation

## 2024-02-07 DIAGNOSIS — I10 Essential (primary) hypertension: Secondary | ICD-10-CM | POA: Insufficient documentation

## 2024-02-07 DIAGNOSIS — R109 Unspecified abdominal pain: Secondary | ICD-10-CM | POA: Diagnosis present

## 2024-02-07 LAB — BASIC METABOLIC PANEL
Anion gap: 15 (ref 5–15)
BUN: 30 mg/dL — ABNORMAL HIGH (ref 8–23)
CO2: 22 mmol/L (ref 22–32)
Calcium: 9.8 mg/dL (ref 8.9–10.3)
Chloride: 102 mmol/L (ref 98–111)
Creatinine, Ser: 1.14 mg/dL — ABNORMAL HIGH (ref 0.44–1.00)
GFR, Estimated: 54 mL/min — ABNORMAL LOW (ref >60)
Glucose, Bld: 120 mg/dL — ABNORMAL HIGH (ref 70–99)
Potassium: 3.1 mmol/L — ABNORMAL LOW (ref 3.5–5.1)
Sodium: 139 mmol/L (ref 135–145)

## 2024-02-07 LAB — CBC WITH DIFFERENTIAL/PLATELET
Abs Immature Granulocytes: 0.03 10*3/uL (ref 0.00–0.07)
Basophils Absolute: 0 10*3/uL (ref 0.0–0.1)
Basophils Relative: 1 %
Eosinophils Absolute: 0.2 10*3/uL (ref 0.0–0.5)
Eosinophils Relative: 3 %
HCT: 42.5 % (ref 36.0–46.0)
Hemoglobin: 14.2 g/dL (ref 12.0–15.0)
Immature Granulocytes: 0 %
Lymphocytes Relative: 28 %
Lymphs Abs: 1.9 10*3/uL (ref 0.7–4.0)
MCH: 26.1 pg (ref 26.0–34.0)
MCHC: 33.4 g/dL (ref 30.0–36.0)
MCV: 78 fL — ABNORMAL LOW (ref 80.0–100.0)
Monocytes Absolute: 0.3 10*3/uL (ref 0.1–1.0)
Monocytes Relative: 5 %
Neutro Abs: 4.2 10*3/uL (ref 1.7–7.7)
Neutrophils Relative %: 63 %
Platelets: 297 10*3/uL (ref 150–400)
RBC: 5.45 MIL/uL — ABNORMAL HIGH (ref 3.87–5.11)
RDW: 15.1 % (ref 11.5–15.5)
WBC: 6.8 10*3/uL (ref 4.0–10.5)
nRBC: 0 % (ref 0.0–0.2)

## 2024-02-07 LAB — URINALYSIS, ROUTINE W REFLEX MICROSCOPIC
Bilirubin Urine: NEGATIVE
Glucose, UA: 50 mg/dL — AB
Hgb urine dipstick: NEGATIVE
Ketones, ur: NEGATIVE mg/dL
Nitrite: NEGATIVE
Protein, ur: 30 mg/dL — AB
RBC / HPF: >50 RBC/hpf (ref 0–5)
Specific Gravity, Urine: 1.023 (ref 1.005–1.030)
WBC, UA: >50 WBC/hpf (ref 0–5)
pH: 5 (ref 5.0–8.0)

## 2024-02-07 MED ORDER — HYDROCODONE-ACETAMINOPHEN 7.5-325 MG PO TABS: 1.0000 | ORAL_TABLET | Freq: Four times a day (QID) | ORAL | 0 refills | Status: AC | PRN

## 2024-02-07 MED ORDER — HYDROCODONE-ACETAMINOPHEN 5-325 MG PO TABS
2.0000 | ORAL_TABLET | Freq: Once | ORAL | Status: AC
  Administered 2024-02-07: 2 via ORAL
  Filled 2024-02-07: qty 2

## 2024-02-07 MED ORDER — ONDANSETRON 4 MG PO TBDP
4.0000 mg | ORAL_TABLET | Freq: Once | ORAL | Status: AC
  Administered 2024-02-07: 4 mg via ORAL
  Filled 2024-02-07: qty 1

## 2024-02-07 MED ORDER — IOHEXOL 300 MG/ML  SOLN
100.0000 mL | Freq: Once | INTRAMUSCULAR | Status: AC | PRN
  Administered 2024-02-07: 100 mL via INTRAVENOUS

## 2024-02-07 MED ORDER — AMOXICILLIN-POT CLAVULANATE 875-125 MG PO TABS: 1.0000 | ORAL_TABLET | Freq: Two times a day (BID) | ORAL | 0 refills | Status: AC

## 2024-02-07 MED ORDER — POTASSIUM CHLORIDE CRYS ER 20 MEQ PO TBCR
20.0000 meq | EXTENDED_RELEASE_TABLET | Freq: Once | ORAL | Status: AC
  Administered 2024-02-07: 20 meq via ORAL
  Filled 2024-02-07: qty 1

## 2024-02-07 NOTE — ED Provider Notes (Signed)
 [facility] Provider Note    Event Date/Time   First MD Initiated Contact with Patient 02/07/24 1544     (approximate)   History   Flank Pain   HPI  Elizabeth Coffey is a 63 y.o. female  who presents with left posterior thoracic pain, hematuria, decreased appetite.  Patient with history of DM II and HTN Patient reports taking aspirin , Tylenol  and Ibuprofen at home without improvement. Patient noticed itchiness after taking the aspirin .       Physical Exam   Triage Vital Signs: ED Triage Vitals  Encounter Vitals Group     BP 02/07/24 1457 108/76     Systolic BP Percentile --      Diastolic BP Percentile --      Pulse Rate 02/07/24 1457 73     Resp 02/07/24 1457 16     Temp 02/07/24 1457 97.9 F (36.6 C)     Temp Source 02/07/24 1457 Oral     SpO2 02/07/24 1457 99 %     Weight 02/07/24 1456 207 lb (93.9 kg)     Height 02/07/24 1456 5\' 3"  (1.6 m)     Head Circumference --      Peak Flow --      Pain Score 02/07/24 1456 3     Pain Loc --      Pain Education --      Exclude from Growth Chart --     Most recent vital signs: Vitals:   02/07/24 1457 02/07/24 1946  BP: 108/76 120/77  Pulse: 73 72  Resp: 16 16  Temp: 97.9 F (36.6 C)   SpO2: 99% 97%     Constitutional: Alert, mild distress.  Eyes: Conjunctivae are normal.  Head: Atraumatic. Nose: No congestion/rhinnorhea. Mouth/Throat: Mucous membranes are moist.   Neck: Painless ROM.  Cardiovascular:   Good peripheral circulation. Respiratory: Normal respiratory effort.  No retractions.  Gastrointestinal: Skin without scars, BS positive, tender to palpation in epigastric area, LLQ. McBurney point negative.  Musculoskeletal:  no deformity Neurologic:  MAE spontaneously. No gross focal neurologic deficits are appreciated.  Skin:  Skin is warm, dry and intact. No rash noted. Psychiatric: Mood and affect are normal. Speech and behavior are normal.    ED Results / Procedures / Treatments    Labs (all labs ordered are listed, but only abnormal results are displayed) Labs Reviewed  URINALYSIS, ROUTINE W REFLEX MICROSCOPIC - Abnormal; Notable for the following components:      Result Value   Color, Urine AMBER (*)    APPearance CLOUDY (*)    Glucose, UA 50 (*)    Protein, ur 30 (*)    Leukocytes,Ua LARGE (*)    Bacteria, UA RARE (*)    All other components within normal limits  BASIC METABOLIC PANEL - Abnormal; Notable for the following components:   Potassium 3.1 (*)    Glucose, Bld 120 (*)    BUN 30 (*)    Creatinine, Ser 1.14 (*)    GFR, Estimated 54 (*)    All other components within normal limits  CBC WITH DIFFERENTIAL/PLATELET - Abnormal; Notable for the following components:   RBC 5.45 (*)    MCV 78.0 (*)    All other components within normal limits     EKG See physician read    RADIOLOGY I independently reviewed and interpreted imaging and agree with radiologists findings.      PROCEDURES:  Critical Care performed:   Procedures  MEDICATIONS ORDERED IN ED: Medications  HYDROcodone -acetaminophen  (NORCO/VICODIN) 5-325 MG per tablet 2 tablet (2 tablets Oral Given 02/07/24 1639)  ondansetron  (ZOFRAN -ODT) disintegrating tablet 4 mg (4 mg Oral Given 02/07/24 1640)  iohexol  (OMNIPAQUE ) 300 MG/ML solution 100 mL (100 mLs Intravenous Contrast Given 02/07/24 1814)  potassium chloride  SA (KLOR-CON  M) CR tablet 20 mEq (20 mEq Oral Given 02/07/24 1943)     IMPRESSION / MDM / ASSESSMENT AND PLAN / ED COURSE  I reviewed the triage vital signs and the nursing notes.  Differential diagnosis includes, but is not limited to, UTI, pyelonephritis, colitis, diverticulitis,  Patient's presentation is most consistent with acute complicated illness / injury requiring diagnostic workup.   Patient's diagnosis is consistent with sigmoid diverticulosis. I independently reviewed and interpreted imaging and agree with radiologists findings. Labs are rea reassuring. I  did review the patient's allergies and medications.  During admission patient received hydrocodone , Zofran , potassium with improvement of the pain.  Patient will be discharged home with prescriptions for Augmentin , Norco. Patient is to follow up with GI and PCP  as needed or otherwise directed. Patient is given ED precautions to return to the ED for any worsening or new symptoms. Discussed plan of care with patient, answered all of patient's questions, Patient agreeable to plan of care. Advised patient to take medications according to the instructions on the label. Discussed possible side effects of new medications. Patient verbalized understanding. Clinical Course as of 02/07/24 1954  Mon Feb 07, 2024  1607 Urinalysis, Routine w reflex microscopic -Urine, Clean Catch(!) Glucos positive, Leukocytes positive. Bacteria rare. [AE]  1952  Sigmoid diverticulosis. 2. Bilateral simple renal cysts. No follow-up imaging is recommended.  3. Aortic atherosclerosis.   [AE]    Clinical Course User Index [AE] Awilda Lennox, PA-C     FINAL CLINICAL IMPRESSION(S) / ED DIAGNOSES   Final diagnoses:  Sigmoid diverticulosis  Flank pain     Rx / DC Orders   ED Discharge Orders          Ordered    HYDROcodone -acetaminophen  (NORCO) 7.5-325 MG tablet  Every 6 hours PRN        02/07/24 1952    amoxicillin -clavulanate (AUGMENTIN ) 875-125 MG tablet  2 times daily        02/07/24 1952             Note:  This document was prepared using Dragon voice recognition software and may include unintentional dictation errors.   Awilda Lennox, PA-C 02/07/24 1955    Bradler, Evan K, MD 02/07/24 2112

## 2024-02-07 NOTE — Discharge Instructions (Addendum)
 You have been diagnosed with diverticulosis, please take Augmentin  1 tablet by mouth 2 times daily for 7 days.  Please take Norco 1 tablet by mouth every 6 hours as needed for pain.  Please go to your GI appointment.  Please call Dr. Anderton soon and make an appointment in 3 days for a follow-up.  Please ask for recheck of your potassium

## 2024-02-07 NOTE — ED Triage Notes (Signed)
 Coming from home, c/o R sided flank back pain that started last night. Pt reports she had a small amount of blood in her urine yesterday, denies performing any strenuous activity lately. Pt took tylenol  yesterday with some relief. GCS 15, gait steady

## 2024-03-02 ENCOUNTER — Encounter: Payer: Self-pay | Admitting: Gastroenterology

## 2024-03-03 ENCOUNTER — Ambulatory Visit
Admission: RE | Admit: 2024-03-03 | Discharge: 2024-03-03 | Disposition: A | Discharge status: Home / Self Care | Payer: 59 | Attending: Gastroenterology | Admitting: Gastroenterology

## 2024-03-03 ENCOUNTER — Encounter: Payer: Self-pay | Admitting: Gastroenterology

## 2024-03-03 ENCOUNTER — Encounter
Admission: RE | Disposition: A | Discharge status: Home / Self Care | Payer: Self-pay | Source: Home / Self Care | Attending: Gastroenterology

## 2024-03-03 ENCOUNTER — Ambulatory Visit: Admitting: Certified Registered"

## 2024-03-03 ENCOUNTER — Other Ambulatory Visit: Payer: Self-pay

## 2024-03-03 DIAGNOSIS — K2950 Unspecified chronic gastritis without bleeding: Secondary | ICD-10-CM | POA: Insufficient documentation

## 2024-03-03 DIAGNOSIS — R1314 Dysphagia, pharyngoesophageal phase: Secondary | ICD-10-CM | POA: Insufficient documentation

## 2024-03-03 DIAGNOSIS — K224 Dyskinesia of esophagus: Secondary | ICD-10-CM | POA: Diagnosis not present

## 2024-03-03 DIAGNOSIS — K3189 Other diseases of stomach and duodenum: Secondary | ICD-10-CM | POA: Diagnosis not present

## 2024-03-03 DIAGNOSIS — I10 Essential (primary) hypertension: Secondary | ICD-10-CM | POA: Diagnosis not present

## 2024-03-03 DIAGNOSIS — Z8673 Personal history of transient ischemic attack (TIA), and cerebral infarction without residual deficits: Secondary | ICD-10-CM | POA: Diagnosis not present

## 2024-03-03 DIAGNOSIS — K219 Gastro-esophageal reflux disease without esophagitis: Secondary | ICD-10-CM | POA: Diagnosis not present

## 2024-03-03 DIAGNOSIS — Z7902 Long term (current) use of antithrombotics/antiplatelets: Secondary | ICD-10-CM | POA: Diagnosis not present

## 2024-03-03 HISTORY — PX: ESOPHAGOGASTRODUODENOSCOPY (EGD) WITH PROPOFOL: SHX5813

## 2024-03-03 LAB — GLUCOSE, CAPILLARY: Glucose-Capillary: 105 mg/dL — ABNORMAL HIGH (ref 70–99)

## 2024-03-03 SURGERY — ESOPHAGOGASTRODUODENOSCOPY (EGD) WITH PROPOFOL
Anesthesia: General

## 2024-03-03 MED ORDER — SODIUM CHLORIDE 0.9 % IV SOLN: INTRAVENOUS | Status: DC

## 2024-03-03 MED ORDER — PROPOFOL 10 MG/ML IV BOLUS
INTRAVENOUS | Status: DC | PRN
  Administered 2024-03-03: 10 mg via INTRAVENOUS
  Administered 2024-03-03: 150 ug/kg/min via INTRAVENOUS

## 2024-03-03 MED ORDER — LIDOCAINE HCL (CARDIAC) PF 100 MG/5ML IV SOSY
PREFILLED_SYRINGE | INTRAVENOUS | Status: DC | PRN
  Administered 2024-03-03: 100 mg via INTRAVENOUS
  Administered 2024-03-03: 50 mg via INTRAVENOUS
  Administered 2024-03-03: 30 mg via INTRAVENOUS

## 2024-03-03 NOTE — Anesthesia Preprocedure Evaluation (Signed)
 Anesthesia Evaluation  Patient identified by MRN, date of birth, ID band Patient awake    Reviewed: Allergy & Precautions, H&P , NPO status , Patient's Chart, lab work & pertinent test results, reviewed documented beta blocker date and time   Airway Mallampati: II   Neck ROM: full    Dental  (+) Poor Dentition   Pulmonary neg pulmonary ROS   Pulmonary exam normal        Cardiovascular Exercise Tolerance: Good hypertension, On Medications negative cardio ROS Normal cardiovascular exam Rhythm:regular Rate:Normal     Neuro/Psych  Headaches PSYCHIATRIC DISORDERS Anxiety Depression     Neuromuscular disease CVA, Residual Symptoms    GI/Hepatic Neg liver ROS, PUD,GERD  Medicated,,  Endo/Other  negative endocrine ROS    Renal/GU negative Renal ROS  negative genitourinary   Musculoskeletal   Abdominal   Peds  Hematology negative hematology ROS (+)   Anesthesia Other Findings Past Medical History: No date: Anxiety No date: Arthritis No date: Hypertension No date: Peptic ulcer 03/16/2020: Stroke [facility]) Past Surgical History: No date: ABDOMINAL HYSTERECTOMY No date: BREAST CYST EXCISION BMI    Body Mass Index: 35.25 kg/m     Reproductive/Obstetrics negative OB ROS                             Anesthesia Physical Anesthesia Plan  ASA: 3  Anesthesia Plan: General   Post-op Pain Management:    Induction:   PONV Risk Score and Plan:   Airway Management Planned:   Additional Equipment:   Intra-op Plan:   Post-operative Plan:   Informed Consent: I have reviewed the patients History and Physical, chart, labs and discussed the procedure including the risks, benefits and alternatives for the proposed anesthesia with the patient or authorized representative who has indicated his/her understanding and acceptance.     Dental Advisory Given  Plan Discussed with: CRNA  Anesthesia Plan  Comments:        Anesthesia Quick Evaluation

## 2024-03-03 NOTE — Op Note (Signed)
 [facility] Gastroenterology Patient Name: Elizabeth Coffey Procedure Date: 03/03/2024 1:20 PM MRN: [id number] Account #: [id number] Date of Birth: 1961/12/26 Admit Type: Outpatient Age: 63 Room: [facility] ENDO ROOM 1 Gender: Female Note Status: Finalized Instrument Name: Laurette Schimke [id number] Procedure:             Upper GI endoscopy Indications:           Epigastric abdominal pain, Esophageal dysphagia Providers:             [name] DO, DO Referring MD:          No Local Md, MD (Referring MD) Medicines:             Monitored Anesthesia Care Complications:         No immediate complications. Estimated blood loss:                         Minimal. Procedure:             Pre-Anesthesia Assessment:                        - Prior to the procedure, a History and Physical was                         performed, and patient medications and allergies were                         reviewed. The patient is competent. The risks and                         benefits of the procedure and the sedation options and                         risks were discussed with the patient. All questions                         were answered and informed consent was obtained.                         Patient identification and proposed procedure were                         verified by the physician, the nurse, the anesthetist                         and the technician in the endoscopy suite. Mental                         Status Examination: alert and oriented. Airway                         Examination: normal oropharyngeal airway and neck                         mobility. Respiratory Examination: clear to                         auscultation. CV Examination: RRR, no murmurs, no S3  or S4. Prophylactic Antibiotics: The patient does not                         require prophylactic antibiotics. Prior                         Anticoagulants: The patient has taken Plavix                          (clopidogrel), last dose was 7 days prior to                         procedure. ASA Grade Assessment: III - A patient with                         severe systemic disease. After reviewing the risks and                         benefits, the patient was deemed in satisfactory                         condition to undergo the procedure. The anesthesia                         plan was to use monitored anesthesia care (MAC).                         Immediately prior to administration of medications,                         the patient was re-assessed for adequacy to receive                         sedatives. The heart rate, respiratory rate, oxygen                         saturations, blood pressure, adequacy of pulmonary                         ventilation, and response to care were monitored                         throughout the procedure. The physical status of the                         patient was re-assessed after the procedure.                        After obtaining informed consent, the endoscope was                         passed under direct vision. Throughout the procedure,                         the patient's blood pressure, pulse, and oxygen                         saturations were monitored continuously. The Endoscope  was introduced through the mouth, and advanced to the                         third part of duodenum. The upper GI endoscopy was                         accomplished without difficulty. The patient tolerated                         the procedure well. Findings:      The duodenal bulb, first portion of the duodenum, second portion of the       duodenum and third portion of the duodenum were normal. Estimated blood       loss: none.      Diffuse atrophic mucosa was found in the entire examined stomach.       Biopsies were taken with a cold forceps for histology. Estimated blood       loss was minimal.      The exam of the  stomach was otherwise normal.      The Z-line was regular. Estimated blood loss: none.      Esophagogastric landmarks were identified: the gastroesophageal junction       was found at 36 cm from the incisors.      Abnormal motility was noted in the esophagus. The cricopharyngeus was       normal. There is spasticity of the esophageal body. The distal       esophagus/lower esophageal sphincter is open. Estimated blood loss: none.      The exam of the esophagus was otherwise normal. Impression:            - Normal duodenal bulb, first portion of the duodenum,                         second portion of the duodenum and third portion of                         the duodenum.                        - Gastric mucosal atrophy. Biopsied.                        - Z-line regular.                        - Esophagogastric landmarks identified.                        - Abnormal esophageal motility, suspicious for                         esophageal spasm. Recommendation:        - Patient has a contact number available for                         emergencies. The signs and symptoms of potential                         delayed complications were discussed with the patient.  Return to normal activities tomorrow. Written                         discharge instructions were provided to the patient.                        - Discharge patient to home.                        - Resume previous diet.                        - Continue present medications.                        - Await pathology results.                        - Return to GI clinic as previously scheduled.                        - The findings and recommendations were discussed with                         the patient.                        - Consider esophagram as well as motility and                         manoemetry study                        Viscous lidocaine for pain                        - The findings and  recommendations were discussed with                         the patient. Procedure Code(s):     --- Professional ---                        [phone], Esophagogastroduodenoscopy, flexible,                         transoral; with biopsy, single or multiple Diagnosis Code(s):     --- Professional ---                        K31.89, Other diseases of stomach and duodenum                        K22.4, Dyskinesia of esophagus                        R10.13, Epigastric pain                        R13.14, Dysphagia, pharyngoesophageal phase CPT copyright 2022 American Medical Association. All rights reserved. The codes documented in this report are preliminary and upon coder review may  be revised to meet current compliance requirements. Attending Participation:      I personally performed  the entire procedure. Elfredia Nevins, DO [name] DO, DO 03/03/2024 1:48:51 PM This report has been signed electronically. Number of Addenda: 0 Note Initiated On: 03/03/2024 1:20 PM Estimated Blood Loss:  Estimated blood loss was minimal.      [facility]

## 2024-03-03 NOTE — Interval H&P Note (Signed)
 History and Physical Interval Note: Preprocedure H&P from 03/03/24  was reviewed and there was no interval change after seeing and examining the patient.  Written consent was obtained from the patient after discussion of risks, benefits, and alternatives. Patient has consented to proceed with Esophagogastroduodenoscopy with possible intervention   03/03/2024 1:27 PM  Elizabeth Coffey  has presented today for surgery, with the diagnosis of K21.9 (ICD-10-CM) - Gastroesophageal reflux disease, unspecified whether esophagitis present R13.10 (ICD-10-CM) - Dysphagia, unspecified type Z83.719 (ICD-10-CM) - Family history of polyps in the colon.  The various methods of treatment have been discussed with the patient and family. After consideration of risks, benefits and other options for treatment, the patient has consented to  Procedure(s): ESOPHAGOGASTRODUODENOSCOPY (EGD) WITH PROPOFOL (N/A) as a surgical intervention.  The patient's history has been reviewed, patient examined, no change in status, stable for surgery.  I have reviewed the patient's chart and labs.  Questions were answered to the patient's satisfaction.     Elizabeth Coffey

## 2024-03-03 NOTE — Transfer of Care (Signed)
 Immediate Anesthesia Transfer of Care Note  Patient: Elizabeth Coffey  Procedure(s) Performed: ESOPHAGOGASTRODUODENOSCOPY (EGD) WITH PROPOFOL  Patient Location: PACU and Endoscopy Unit  Anesthesia Type:General  Level of Consciousness: awake, drowsy, and patient cooperative  Airway & Oxygen Therapy: Patient Spontanous Breathing and Patient connected to nasal cannula oxygen  Post-op Assessment: Report given to RN and Post -op Vital signs reviewed and stable  Post vital signs: stable  Last Vitals:  Vitals Value Taken Time  BP 115/77 03/03/24 1346  Temp    Pulse 78 03/03/24 1346  Resp 19 03/03/24 1346  SpO2 96 % 03/03/24 1346    Last Pain:  Vitals:   03/03/24 1346  TempSrc:   PainSc: Asleep         Complications: No notable events documented.

## 2024-03-03 NOTE — H&P (Signed)
 Pre-Procedure H&P   Patient ID: Elizabeth Coffey is a 63 y.o. female.  Gastroenterology Provider: [name], DO  Referring Provider: Tawni Pummel, PA PCP: Elizabeth Regulus, MD  Date: 03/03/2024  HPI [name] is a 63 y.o. female who presents today for Esophagogastroduodenoscopy for Dysphagia, GERD .  Patient has noted upper esophageal sticking of solids and pills.  She does have some issues with liquids. No odynophagia.  She was taking regular Goody powders, but no longer.  She notes epigastric pain.  Currently on Protonix twice daily  H. pylori testing was negative creatinine 1.3 hemoglobin 12.5 MCV 82 platelets 269,000  Patient on Plavix for history of CVA which has been held for this procedure (1 week)   Past Medical History:  Diagnosis Date   Anxiety    Arthritis    Hypertension    Peptic ulcer    Stroke (HCC) 03/16/2020    Past Surgical History:  Procedure Laterality Date   ABDOMINAL HYSTERECTOMY     BREAST CYST EXCISION      Family History No h/o GI disease or malignancy  Review of Systems  Constitutional:  Negative for activity change, appetite change, chills, diaphoresis, fatigue, fever and unexpected weight change.  HENT:  Positive for trouble swallowing. Negative for voice change.   Respiratory:  Negative for shortness of breath and wheezing.   Cardiovascular:  Negative for chest pain, palpitations and leg swelling.  Gastrointestinal:  Negative for abdominal distention, abdominal pain, anal bleeding, blood in stool, constipation, diarrhea, nausea, rectal pain and vomiting.  Musculoskeletal:  Negative for arthralgias and myalgias.  Skin:  Negative for color change and pallor.  Neurological:  Negative for dizziness, syncope and weakness.  Psychiatric/Behavioral:  Negative for confusion.   All other systems reviewed and are negative.    Medications No current facility-administered medications on file prior to encounter.   Current  Outpatient Medications on File Prior to Encounter  Medication Sig Dispense Refill   albuterol-ipratropium (COMBIVENT) 18-103 MCG/ACT inhaler Inhale 1 puff into the lungs every 4 (four) hours as needed for wheezing or shortness of breath. 1 Inhaler 2   amLODipine-benazepril (LOTREL) 10-20 MG capsule Take 1 capsule by mouth daily. 30 capsule 0   aspirin EC 81 MG tablet Take 1 tablet (81 mg total) by mouth daily. 30 tablet 0   clopidogrel (PLAVIX) 75 MG tablet Take 75 mg by mouth daily.     DULoxetine (CYMBALTA) 30 MG capsule TAKE 1 CAPSULE (30 MG TOTAL) BY MOUTH DAILY. FOR MOOD 90 capsule 1   nortriptyline (PAMELOR) 10 MG capsule Take 2 capsules (20 mg total) by mouth at bedtime. 60 capsule 0   QUEtiapine (SEROQUEL) 25 MG tablet TAKE 1 TABLET (25 MG TOTAL) BY MOUTH AT BEDTIME. FOR MOOD AND SLEEP 90 tablet 1   linaclotide (LINZESS) 290 MCG CAPS capsule Take by mouth.     omeprazole (PRILOSEC) 40 MG capsule Take 40 mg by mouth daily.     simvastatin (ZOCOR) 20 MG tablet Take 1 tablet (20 mg total) by mouth every evening. 30 tablet 11   sucralfate (CARAFATE) 1 g tablet Take 1 tablet (1 g total) by mouth 4 (four) times daily. 120 tablet 1    Pertinent medications related to GI and procedure were reviewed by me with the patient prior to the procedure   Current Facility-Administered Medications:    0.9 %  sodium chloride infusion, , Intravenous, Continuous, Elizabeth Collins, DO, Last Rate: 20 mL/hr at 03/03/24  1307, New Bag at 03/03/24 1307  sodium chloride 20 mL/hr at 03/03/24 1307       Allergies  Allergen Reactions   Aspirin Itching   Mobic [Meloxicam] Other (See Comments)    Light headed and vomiting.    Tramadol Other (See Comments)    Stomach pain    Chocolate Itching and Rash   Cocoa Itching and Rash   Allergies were reviewed by me prior to the procedure  Objective   Body mass index is 35.25 kg/m. Vitals:   03/03/24 1251  BP: (!) 131/101  Pulse: 85  Resp: 15  Temp:  (!) 96.8 F (36 C)  TempSrc: Temporal  SpO2: 100%  Weight: 90.3 kg  Height: 5\' 3"  (1.6 m)    Physical Exam Vitals and nursing note reviewed.  Constitutional:      General: She is not in acute distress.    Appearance: Normal appearance. She is not ill-appearing, toxic-appearing or diaphoretic.  HENT:     Head: Normocephalic and atraumatic.     Nose: Nose normal.     Mouth/Throat:     Mouth: Mucous membranes are moist.     Pharynx: Oropharynx is clear.  Eyes:     General: No scleral icterus.    Extraocular Movements: Extraocular movements intact.  Cardiovascular:     Rate and Rhythm: Normal rate and regular rhythm.     Heart sounds: Normal heart sounds. No murmur heard.    No friction rub. No gallop.  Pulmonary:     Effort: Pulmonary effort is normal. No respiratory distress.     Breath sounds: Normal breath sounds. No wheezing, rhonchi or rales.  Abdominal:     General: Bowel sounds are normal. There is no distension.     Palpations: Abdomen is soft.     Tenderness: There is no abdominal tenderness. There is no guarding or rebound.  Musculoskeletal:     Cervical back: Neck supple.     Right lower leg: No edema.     Left lower leg: No edema.  Skin:    General: Skin is warm and dry.     Coloration: Skin is not jaundiced or pale.  Neurological:     General: No focal deficit present.     Mental Status: She is alert and oriented to person, place, and time. Mental status is at baseline.  Psychiatric:        Mood and Affect: Mood normal.        Behavior: Behavior normal.        Thought Content: Thought content normal.        Judgment: Judgment normal.      Assessment:  [name] is a 63 y.o. female  who presents today for Esophagogastroduodenoscopy for Dysphagia, GERD .  Plan:  Esophagogastroduodenoscopy with possible intervention today  Esophagogastroduodenoscopy with possible biopsy, control of bleeding, polypectomy, and interventions as necessary has been  discussed with the patient/patient representative. Informed consent was obtained from the patient/patient representative after explaining the indication, nature, and risks of the procedure including but not limited to death, bleeding, perforation, missed neoplasm/lesions, cardiorespiratory compromise, and reaction to medications. Opportunity for questions was given and appropriate answers were provided. Patient/patient representative has verbalized understanding is amenable to undergoing the procedure.   Elizabeth Collins, DO  [facility] Gastroenterology  Portions of the record may have been created with voice recognition software. Occasional wrong-word or 'sound-a-like' substitutions may have occurred due to the inherent limitations of voice recognition software.  Read the chart carefully and recognize, using context, where substitutions may have occurred.

## 2024-03-06 LAB — SURGICAL PATHOLOGY

## 2024-03-07 NOTE — Anesthesia Postprocedure Evaluation (Signed)
 Anesthesia Post Note  Patient: Elizabeth Coffey  Procedure(s) Performed: ESOPHAGOGASTRODUODENOSCOPY (EGD) WITH PROPOFOL  Patient location during evaluation: PACU Anesthesia Type: General Level of consciousness: awake and alert Pain management: pain level controlled Vital Signs Assessment: post-procedure vital signs reviewed and stable Respiratory status: spontaneous breathing, nonlabored ventilation, respiratory function stable and patient connected to nasal cannula oxygen Cardiovascular status: blood pressure returned to baseline and stable Postop Assessment: no apparent nausea or vomiting Anesthetic complications: no   There were no known notable events for this encounter.   Last Vitals:  Vitals:   03/03/24 1356 03/03/24 1404  BP: 127/87 134/86  Pulse: 75 71  Resp: 18   Temp:    SpO2: 99% 99%    Last Pain:  Vitals:   03/03/24 1404  TempSrc:   PainSc: 0-No pain                 Yevette Edwards

## 2024-03-13 ENCOUNTER — Other Ambulatory Visit: Payer: Self-pay | Admitting: Gastroenterology

## 2024-03-13 DIAGNOSIS — R1310 Dysphagia, unspecified: Secondary | ICD-10-CM

## 2024-03-21 ENCOUNTER — Ambulatory Visit

## 2024-03-24 ENCOUNTER — Ambulatory Visit

## 2024-03-29 ENCOUNTER — Ambulatory Visit
Admission: RE | Admit: 2024-03-29 | Discharge: 2024-03-29 | Disposition: A | Discharge status: Home / Self Care | Source: Ambulatory Visit | Attending: Gastroenterology | Admitting: Gastroenterology

## 2024-03-29 DIAGNOSIS — R1310 Dysphagia, unspecified: Secondary | ICD-10-CM | POA: Insufficient documentation

## 2024-12-06 ENCOUNTER — Encounter: Admission: RE | Payer: Self-pay

## 2024-12-06 ENCOUNTER — Ambulatory Visit: Admission: RE | Admit: 2024-12-06 | Admitting: Ophthalmology

## 2024-12-06 SURGERY — PHACOEMULSIFICATION, CATARACT, WITH IOL INSERTION
Anesthesia: Topical | Laterality: Right

## 2025-01-09 NOTE — Discharge Instructions (Signed)

## 2025-01-10 ENCOUNTER — Ambulatory Visit
Admission: RE | Admit: 2025-01-10 | Discharge: 2025-01-10 | Disposition: A | Discharge status: Home / Self Care | Attending: Ophthalmology | Admitting: Ophthalmology

## 2025-01-10 ENCOUNTER — Encounter: Payer: Self-pay | Admitting: Ophthalmology

## 2025-01-10 ENCOUNTER — Other Ambulatory Visit: Payer: Self-pay

## 2025-01-10 DIAGNOSIS — H2511 Age-related nuclear cataract, right eye: Secondary | ICD-10-CM | POA: Diagnosis present

## 2025-01-10 DIAGNOSIS — E119 Type 2 diabetes mellitus without complications: Secondary | ICD-10-CM

## 2025-01-10 DIAGNOSIS — E1136 Type 2 diabetes mellitus with diabetic cataract: Secondary | ICD-10-CM | POA: Diagnosis not present

## 2025-01-10 DIAGNOSIS — I10 Essential (primary) hypertension: Secondary | ICD-10-CM | POA: Insufficient documentation

## 2025-01-10 HISTORY — PX: CATARACT EXTRACTION W/PHACO: SHX586

## 2025-01-10 MED ORDER — FENTANYL CITRATE (PF) 100 MCG/2ML IJ SOLN
INTRAMUSCULAR | Status: AC
  Filled 2025-01-10: qty 2

## 2025-01-10 MED ORDER — SIGHTPATH DOSE#1 BSS IO SOLN
INTRAOCULAR | Status: DC | PRN
  Administered 2025-01-10: 82 mL via OPHTHALMIC

## 2025-01-10 MED ORDER — SIGHTPATH DOSE#1 SODIUM HYALURONATE 10 MG/ML IO SOLUTION
PREFILLED_SYRINGE | INTRAOCULAR | Status: DC | PRN
  Administered 2025-01-10: .85 mL via INTRAOCULAR

## 2025-01-10 MED ORDER — TRYPAN BLUE 0.06 % IO SOSY
PREFILLED_SYRINGE | INTRAOCULAR | Status: DC | PRN
  Administered 2025-01-10: .5 mL via INTRAOCULAR

## 2025-01-10 MED ORDER — LIDOCAINE HCL (PF) 2 % IJ SOLN
INTRAOCULAR | Status: DC | PRN
  Administered 2025-01-10: 1 mL via INTRAOCULAR

## 2025-01-10 MED ORDER — CYCLOPENTOLATE HCL 2 % OP SOLN
OPHTHALMIC | Status: AC
  Filled 2025-01-10: qty 2

## 2025-01-10 MED ORDER — PHENYLEPHRINE HCL 10 % OP SOLN
1.0000 [drp] | OPHTHALMIC | Status: AC | PRN
  Administered 2025-01-10 (×3): 1 [drp] via OPHTHALMIC

## 2025-01-10 MED ORDER — MIDAZOLAM HCL (PF) 2 MG/2ML IJ SOLN
INTRAMUSCULAR | Status: DC | PRN
  Administered 2025-01-10: 2 mg via INTRAVENOUS

## 2025-01-10 MED ORDER — PHENYLEPHRINE HCL 10 % OP SOLN
OPHTHALMIC | Status: AC
  Filled 2025-01-10: qty 5

## 2025-01-10 MED ORDER — CYCLOPENTOLATE HCL 2 % OP SOLN
1.0000 [drp] | OPHTHALMIC | Status: AC | PRN
  Administered 2025-01-10 (×3): 1 [drp] via OPHTHALMIC

## 2025-01-10 MED ORDER — SIGHTPATH DOSE#1 BSS IO SOLN
INTRAOCULAR | Status: DC | PRN
  Administered 2025-01-10 (×2): 15 mL via INTRAOCULAR

## 2025-01-10 MED ORDER — MIDAZOLAM HCL 2 MG/2ML IJ SOLN
INTRAMUSCULAR | Status: AC
  Filled 2025-01-10: qty 2

## 2025-01-10 MED ORDER — TETRACAINE HCL 0.5 % OP SOLN
OPHTHALMIC | Status: AC
  Filled 2025-01-10: qty 4

## 2025-01-10 MED ORDER — FENTANYL CITRATE (PF) 100 MCG/2ML IJ SOLN
INTRAMUSCULAR | Status: DC | PRN
  Administered 2025-01-10: 25 ug via INTRAVENOUS

## 2025-01-10 MED ORDER — MOXIFLOXACIN HCL 0.5 % OP SOLN
OPHTHALMIC | Status: DC | PRN
  Administered 2025-01-10: .2 mL via OPHTHALMIC

## 2025-01-10 MED ORDER — TETRACAINE HCL 0.5 % OP SOLN
1.0000 [drp] | OPHTHALMIC | Status: DC | PRN
  Administered 2025-01-10 (×3): 1 [drp] via OPHTHALMIC

## 2025-01-10 MED ORDER — SIGHTPATH DOSE#1 NA HYALUR & NA CHOND-NA HYALUR IO KIT
PACK | INTRAOCULAR | Status: DC | PRN
  Administered 2025-01-10: 1 via OPHTHALMIC

## 2025-01-10 NOTE — Op Note (Signed)
 PREOPERATIVE DIAGNOSIS:  Nuclear sclerotic cataract of the right eye.   POSTOPERATIVE DIAGNOSIS:  Right Eye Cataract   OPERATIVE PROCEDURE: Phacoemulsification with IOL Implant Right Eye   SURGEON:  Curtistine Fava, MD   ANESTHESIA:  Anesthesiologist: Maggioncalda, Fairy LABOR, MD CRNA: Levy Harvey, CRNA; Dante India, CRNA  Monitored anesthesia care. Topical tetracaine  drops followed by 2% Xylocaine  jelly applied in the preoperative holding area 0.36ml of epi-Shugarcaine was instilled in the eye following the paracentesis.   COMPLICATIONS:  None.   TECHNIQUE:   Phacoemulsification divide and conquer   DESCRIPTION OF PROCEDURE:  The patient was examined and consented in the preoperative holding area where the aforementioned topical anesthesia was applied to the right eye and then brought back to the Operating Room where the right eye was prepped and draped in the usual sterile ophthalmic fashion and a lid speculum was placed. A paracentesis was created with the side port blade and the anterior chamber was filled with epi-Shugarcaine followed by trypan blue  followed by viscoelastic. A clear corneal incision was performed with the steel keratome. A continuous curvilinear capsulorrhexis was performed with a cystotome followed by the capsulorrhexis forceps. Hydrodissection and hydrodelineation were carried out with BSS on a blunt cannula. The lens was removed in a divide and conquer technique and the remaining cortical material was removed with the irrigation-aspiration handpiece. The capsular bag was inflated with viscoelastic and the +20.0 D [id number] lens was placed in the capsular bag without complication. The remaining viscoelastic was removed from the eye with the irrigation-aspiration handpiece. The wounds were hydrated. The anterior chamber was flushed with BSS and the eye was inflated to physiologic pressure. 0.1ml of Vigamox  was placed in the anterior chamber. The wounds were found to be water  tight. The eye was dressed with Combigan and covered with a clear shield to be worn until the first postoperative day appointment. The patient was given protective glasses to wear throughout the day. The patient was also given drops with which to begin a drop regimen today and will follow-up with me in one day. Implant Name Type Inv. Item Serial No. Manufacturer Lot No. LRB No. Used Action  LENS IOL CLRN CLEAR 20.0 - [id number] Intraocular Lens LENS IOL CLRN CLEAR 20.0 [id number] [employer]  Right 1 Wasted  LENS IOL CLRN CLEAR 20.0 - [id number] Intraocular Lens LENS IOL CLRN CLEAR 20.0 [id number] [employer]  Right 1 Implanted   Procedures: PHACOEMULSIFICATION, CATARACT, WITH IOL INSERTION 4.46, 00:32.0 (Right)  Electronically signed: Curtistine PARAS [facility] 01/10/2025 1:18 PM

## 2025-01-10 NOTE — Anesthesia Postprocedure Evaluation (Signed)
"   Anesthesia Post Note  Patient: Laportia A Bernabe  Procedure(s) Performed: PHACOEMULSIFICATION, CATARACT, WITH IOL INSERTION 4.46, 00:32.0 (Right)  Patient location during evaluation: PACU Anesthesia Type: MAC Level of consciousness: awake and alert Pain management: pain level controlled Vital Signs Assessment: post-procedure vital signs reviewed and stable Respiratory status: spontaneous breathing, nonlabored ventilation, respiratory function stable and patient connected to nasal cannula oxygen Cardiovascular status: stable and blood pressure returned to baseline Postop Assessment: no apparent nausea or vomiting Anesthetic complications: no   No notable events documented.   Last Vitals:  Vitals:   01/10/25 1321 01/10/25 1327  BP: (!) 140/75 130/85  Pulse: (!) 57 60  Resp: 15 16  Temp: (!) 36.3 C (!) 36.3 C  SpO2: 96% 97%    Last Pain:  Vitals:   01/10/25 1327  TempSrc:   PainSc: 0-No pain                 Fairy A Zebastian Carico      "

## 2025-01-10 NOTE — H&P (Signed)
 [facility]   Primary Care Physician:  Lenon Layman ORN, MD Ophthalmologist: Dr. Curtistine Coffey  Pre-Procedure History & Physical: HPI:  Elizabeth Coffey is a 64 y.o. female here for cataract surgery RIGHT EYE.   Past Medical History:  Diagnosis Date   Anxiety    Arthritis    Asthma    Depression    Diabetes mellitus without complication (HCC)    type 2   GERD (gastroesophageal reflux disease)    Hypertension    Neuromuscular disorder (HCC)    neuropathy in legs   Peptic ulcer    Stroke (HCC) 03/16/2020    Past Surgical History:  Procedure Laterality Date   ABDOMINAL HYSTERECTOMY     BREAST CYST EXCISION     ESOPHAGOGASTRODUODENOSCOPY (EGD) WITH PROPOFOL  N/A 03/03/2024   Procedure: ESOPHAGOGASTRODUODENOSCOPY (EGD) WITH PROPOFOL ;  Surgeon: Onita Elspeth Sharper, DO;  Location: [facility] ENDOSCOPY;  Service: Gastroenterology;  Laterality: N/A;    Prior to Admission medications  Medication Sig Start Date End Date Taking? Authorizing Provider  albuterol -ipratropium (COMBIVENT ) 18-103 MCG/ACT inhaler Inhale 1 puff into the lungs every 4 (four) hours as needed for wheezing or shortness of breath. 09/29/15  Yes Beers, Carlin HERO, PA-C  amLODipine  (NORVASC ) 10 MG tablet Take 10 mg by mouth daily.   Yes [provider]  aspirin  EC 81 MG tablet Take 1 tablet (81 mg total) by mouth daily. 03/04/20  Yes Swayze, Ava, DO  Atogepant (QULIPTA) 60 MG TABS Take 60 mg by mouth daily.   Yes [provider]  carvedilol  (COREG ) 25 MG tablet Take 25 mg by mouth 2 (two) times daily with a meal.   Yes [provider]  cloNIDine  (CATAPRES ) 0.2 MG tablet Take 0.2 mg by mouth 2 (two) times daily.   Yes [provider]  clopidogrel  (PLAVIX ) 75 MG tablet Take 75 mg by mouth daily.   Yes [provider]  empagliflozin (JARDIANCE) 10 MG TABS tablet Take 10 mg by mouth daily.   Yes [provider]  furosemide (LASIX) 40 MG tablet Take 40 mg by mouth daily.    Yes [provider]  gabapentin (NEURONTIN) 300 MG capsule Take 300 mg by mouth 2 (two) times daily.   Yes [provider]  hydrochlorothiazide (HYDRODIURIL) 25 MG tablet Take 25 mg by mouth daily.   Yes [provider]  pantoprazole (PROTONIX) 40 MG tablet Take 40 mg by mouth 2 (two) times daily.   Yes [provider]  potassium chloride  (KLOR-CON  M) 10 MEQ tablet Take 10 mEq by mouth 2 (two) times daily.   Yes [provider]  QUEtiapine  (SEROQUEL ) 100 MG tablet Take 100 mg by mouth at bedtime.   Yes [provider]  rosuvastatin (CRESTOR) 10 MG tablet Take 10 mg by mouth daily.   Yes [provider]  sertraline (ZOLOFT) 50 MG tablet Take 50 mg by mouth daily.   Yes [provider]  topiramate (TOPAMAX) 50 MG tablet Take 50 mg by mouth 2 (two) times daily.   Yes [provider]  sucralfate  (CARAFATE ) 1 g tablet Take 1 tablet (1 g total) by mouth 4 (four) times daily. 08/03/23 08/02/24  Claudene Rover, MD    Allergies as of 12/06/2024 - Review Complete 03/20/2024  Allergen Reaction Noted   Aspirin  Itching 09/29/2015   Mobic [meloxicam] Other (See Comments) 09/29/2015   Tramadol  Other (See Comments) 09/29/2015   Chocolate Itching and Rash 02/22/2012   Cocoa Itching and Rash 02/22/2012  Family History  Problem Relation Age of Onset   Mental illness Maternal Uncle     Social History   Socioeconomic History   Marital status: Widowed    Spouse name: Not on file   Number of children: 1   Years of education: Not on file   Highest education level: 11th grade  Occupational History   Not on file  Tobacco Use   Smoking status: Never   Smokeless tobacco: Never  Vaping Use   Vaping status: Never Used  Substance and Sexual Activity   Alcohol use: Never   Drug use: Never   Sexual activity: Not Currently  Other Topics Concern   Not on file  Social History Narrative   Not on file   Social Drivers of Health    Tobacco Use: Low Risk (01/10/2025)   Patient History    Smoking Tobacco Use: Never    Smokeless Tobacco Use: Never    Passive Exposure: Not on file  Financial Resource Strain: Low Risk  (04/20/2024)   Received from [facility]   Overall Financial Resource Strain (CARDIA)    Difficulty of Paying Living Expenses: Not very hard  Food Insecurity: No Food Insecurity (04/20/2024)   Received from [facility]   Epic    Within the past 12 months, you worried that your food would run out before you got the money to buy more.: Never true    Within the past 12 months, the food you bought just didn't last and you didn't have money to get more.: Never true  Transportation Needs: No Transportation Needs (04/20/2024)   Received from [facility] - Transportation    In the past 12 months, has lack of transportation kept you from medical appointments or from getting medications?: No    Lack of Transportation (Non-Medical): No  Physical Activity: Inactive (08/19/2022)   Received from [facility]   Exercise Vital Sign    On average, how many days per week do you engage in moderate to strenuous exercise (like a brisk walk)?: 0 days    On average, how many minutes do you engage in exercise at this level?: 0 min  Stress: Stress Concern Present (08/19/2022)   Received from [facility] - Occupational Stress Questionnaire    Feeling of Stress : Very much  Social Connections: Socially Isolated (08/19/2022)   Received from [facility]   Social Connection and Isolation Panel    In a typical week, how many times do you talk on the phone with family, friends, or neighbors?: Twice a week    How often do you get together with friends or relatives?: Never    How often do you attend church or religious services?: More than 4 times per year    Do you belong to any  clubs or organizations such as church groups, unions, fraternal or athletic groups, or school groups?: No    How often do you attend meetings of the clubs or organizations you belong to?: Never    Are you married, widowed, divorced, separated, never married, or living with a partner?: Widowed  Intimate Partner Violence: Not on file  Depression (PHQ2-9): Not on file  Alcohol Screen: Not on file  Housing: Unknown (04/20/2024)   Received from [facility]   Epic    In the last 12 months, was there a time when you were  not able to pay the mortgage or rent on time?: No    Number of Times Moved in the Last Year: Not on file    At any time in the past 12 months, were you homeless or living in a shelter (including now)?: No  Utilities: Not At Risk (04/20/2024)   Received from [facility] Utilities    Threatened with loss of utilities: No  Health Literacy: Not on file    Review of Systems: See HPI, otherwise negative ROS  Physical Exam: BP 127/79   Pulse 61   Temp 98.2 F (36.8 C) (Temporal)   Resp 20   Ht 5' 3 (1.6 m)   Wt 94.4 kg   SpO2 95%   BMI 36.86 kg/m  General:   Alert, cooperative in NAD Head:  Normocephalic and atraumatic. Respiratory:  Normal work of breathing. Cardiovascular:  RRR  Impression/Plan: Elizabeth Coffey is here for cataract surgery RIGHT EYE.  Risks, benefits, limitations, and alternatives regarding cataract surgery have been reviewed with the patient.  Questions have been answered.  All parties agreeable.   Elizabeth JINNY Fava, MD  01/10/2025, 12:26 PM

## 2025-01-10 NOTE — Anesthesia Preprocedure Evaluation (Signed)
"                                    Anesthesia Evaluation  Patient identified by MRN, date of birth, ID band Patient awake    Reviewed: Allergy & Precautions, H&P , NPO status , Patient's Chart, lab work & pertinent test results  Airway Mallampati: II  TM Distance: >3 FB Neck ROM: Full    Dental no notable dental hx. (+) Partial Lower, Poor Dentition   Pulmonary neg pulmonary ROS   Pulmonary exam normal breath sounds clear to auscultation       Cardiovascular hypertension, negative cardio ROS Normal cardiovascular exam Rhythm:Regular Rate:Normal     Neuro/Psych negative neurological ROS  negative psych ROS   GI/Hepatic negative GI ROS, Neg liver ROS,,,  Endo/Other  negative endocrine ROSdiabetes    Renal/GU negative Renal ROS  negative genitourinary   Musculoskeletal negative musculoskeletal ROS (+)    Abdominal   Peds negative pediatric ROS (+)  Hematology negative hematology ROS (+)   Anesthesia Other Findings   Reproductive/Obstetrics negative OB ROS                              Anesthesia Physical Anesthesia Plan  ASA: 3  Anesthesia Plan: MAC   Post-op Pain Management:    Induction: Intravenous  PONV Risk Score and Plan:   Airway Management Planned:   Additional Equipment:   Intra-op Plan:   Post-operative Plan: Extubation in OR  Informed Consent: I have reviewed the patients History and Physical, chart, labs and discussed the procedure including the risks, benefits and alternatives for the proposed anesthesia with the patient or authorized representative who has indicated his/her understanding and acceptance.     Dental advisory given  Plan Discussed with: CRNA  Anesthesia Plan Comments:         Anesthesia Quick Evaluation  "

## 2025-01-10 NOTE — Transfer of Care (Signed)
 Immediate Anesthesia Transfer of Care Note  Patient: Elizabeth Coffey  Procedure(s) Performed: PHACOEMULSIFICATION, CATARACT, WITH IOL INSERTION 4.46, 00:32.0 (Right)  Patient Location: PACU  Anesthesia Type: MAC  Level of Consciousness: awake, alert  and patient cooperative  Airway and Oxygen Therapy: Patient Spontanous Breathing and Patient connected to supplemental oxygen  Post-op Assessment: Post-op Vital signs reviewed, Patient's Cardiovascular Status Stable, Respiratory Function Stable, Patent Airway and No signs of Nausea or vomiting  Post-op Vital Signs: Reviewed and stable  Complications: No notable events documented.

## 2025-01-11 ENCOUNTER — Encounter: Admission: RE | Disposition: A | Payer: Self-pay | Source: Home / Self Care | Attending: Ophthalmology

## 2025-01-11 ENCOUNTER — Ambulatory Visit: Admitting: Anesthesiology

## 2025-01-11 HISTORY — DX: Myoneural disorder, unspecified: G70.9

## 2025-01-11 HISTORY — DX: Unspecified asthma, uncomplicated: J45.909

## 2025-01-11 HISTORY — DX: Depression, unspecified: F32.A

## 2025-01-11 HISTORY — DX: Type 2 diabetes mellitus without complications: E11.9

## 2025-01-11 HISTORY — DX: Gastro-esophageal reflux disease without esophagitis: K21.9

## 2025-01-11 LAB — GLUCOSE, CAPILLARY: Glucose-Capillary: 119 mg/dL — ABNORMAL HIGH (ref 70–99)

## 2025-01-19 ENCOUNTER — Encounter: Payer: Self-pay | Admitting: Anesthesiology

## 2025-01-30 NOTE — Discharge Instructions (Signed)

## 2025-02-01 ENCOUNTER — Ambulatory Visit: Admission: RE | Admit: 2025-02-01 | Source: Home / Self Care

## 2025-02-01 ENCOUNTER — Encounter: Admission: RE | Payer: Self-pay | Source: Home / Self Care
